# Patient Record
Sex: Male | Born: 1935 | Race: Black or African American | Hispanic: No | Marital: Married | Smoking: Former smoker
Health system: Southern US, Community
[De-identification: ages and names within clinical notes are randomized; demographics above are authoritative.]

## PROBLEM LIST (undated history)

## (undated) DIAGNOSIS — C801 Malignant (primary) neoplasm, unspecified: Secondary | ICD-10-CM

## (undated) DIAGNOSIS — E278 Other specified disorders of adrenal gland: Secondary | ICD-10-CM

## (undated) DIAGNOSIS — I2510 Atherosclerotic heart disease of native coronary artery without angina pectoris: Secondary | ICD-10-CM

## (undated) DIAGNOSIS — Z5189 Encounter for other specified aftercare: Secondary | ICD-10-CM

## (undated) DIAGNOSIS — Z992 Dependence on renal dialysis: Secondary | ICD-10-CM

## (undated) DIAGNOSIS — I499 Cardiac arrhythmia, unspecified: Secondary | ICD-10-CM

## (undated) DIAGNOSIS — J189 Pneumonia, unspecified organism: Secondary | ICD-10-CM

## (undated) DIAGNOSIS — F419 Anxiety disorder, unspecified: Secondary | ICD-10-CM

## (undated) DIAGNOSIS — M1990 Unspecified osteoarthritis, unspecified site: Secondary | ICD-10-CM

## (undated) DIAGNOSIS — I10 Essential (primary) hypertension: Secondary | ICD-10-CM

## (undated) DIAGNOSIS — D649 Anemia, unspecified: Secondary | ICD-10-CM

## (undated) DIAGNOSIS — N186 End stage renal disease: Secondary | ICD-10-CM

## (undated) DIAGNOSIS — G709 Myoneural disorder, unspecified: Secondary | ICD-10-CM

## (undated) DIAGNOSIS — IMO0001 Reserved for inherently not codable concepts without codable children: Secondary | ICD-10-CM

## (undated) DIAGNOSIS — J449 Chronic obstructive pulmonary disease, unspecified: Secondary | ICD-10-CM

## (undated) DIAGNOSIS — Z974 Presence of external hearing-aid: Secondary | ICD-10-CM

## (undated) DIAGNOSIS — E785 Hyperlipidemia, unspecified: Secondary | ICD-10-CM

## (undated) HISTORY — DX: Essential (primary) hypertension: I10

## (undated) HISTORY — DX: Anemia, unspecified: D64.9

## (undated) HISTORY — PX: EYE SURGERY: SHX253

## (undated) HISTORY — PX: DIAGNOSTIC LAPAROSCOPY: SUR761

## (undated) HISTORY — PX: NOSE SURGERY: SHX723

## (undated) HISTORY — DX: Myoneural disorder, unspecified: G70.9

## (undated) HISTORY — DX: Hyperlipidemia, unspecified: E78.5

## (undated) HISTORY — DX: Unspecified osteoarthritis, unspecified site: M19.90

## (undated) HISTORY — PX: US ECHOCARDIOGRAPHY: HXRAD669

## (undated) HISTORY — PX: KIDNEY SURGERY: SHX687

## (undated) HISTORY — DX: Anxiety disorder, unspecified: F41.9

## (undated) HISTORY — DX: Presence of external hearing-aid: Z97.4

## (undated) HISTORY — PX: INSERTION OF DIALYSIS CATHETER: SHX1324

## (undated) HISTORY — DX: Atherosclerotic heart disease of native coronary artery without angina pectoris: I25.10

---

## 1998-07-20 ENCOUNTER — Encounter: Payer: Self-pay | Admitting: Cardiovascular Disease

## 1998-07-20 ENCOUNTER — Inpatient Hospital Stay (HOSPITAL_COMMUNITY): Admission: EM | Admit: 1998-07-20 | Discharge: 1998-07-22 | Payer: Self-pay | Admitting: Emergency Medicine

## 1998-07-22 ENCOUNTER — Encounter: Payer: Self-pay | Admitting: Cardiovascular Disease

## 2000-10-22 ENCOUNTER — Encounter: Admission: RE | Admit: 2000-10-22 | Discharge: 2000-10-22 | Payer: Self-pay | Admitting: Cardiovascular Disease

## 2000-10-22 ENCOUNTER — Encounter: Payer: Self-pay | Admitting: Cardiovascular Disease

## 2000-11-16 ENCOUNTER — Inpatient Hospital Stay (HOSPITAL_COMMUNITY): Admission: EM | Admit: 2000-11-16 | Discharge: 2000-11-18 | Payer: Self-pay | Admitting: Emergency Medicine

## 2000-11-16 ENCOUNTER — Encounter: Payer: Self-pay | Admitting: Cardiovascular Disease

## 2002-03-08 ENCOUNTER — Encounter: Payer: Self-pay | Admitting: Cardiovascular Disease

## 2002-03-08 ENCOUNTER — Ambulatory Visit (HOSPITAL_COMMUNITY): Admission: RE | Admit: 2002-03-08 | Discharge: 2002-03-08 | Payer: Self-pay | Admitting: Cardiovascular Disease

## 2002-03-10 ENCOUNTER — Ambulatory Visit (HOSPITAL_COMMUNITY): Admission: RE | Admit: 2002-03-10 | Discharge: 2002-03-10 | Payer: Self-pay | Admitting: Cardiovascular Disease

## 2002-03-10 ENCOUNTER — Encounter: Payer: Self-pay | Admitting: Cardiovascular Disease

## 2002-03-11 ENCOUNTER — Ambulatory Visit (HOSPITAL_COMMUNITY): Admission: RE | Admit: 2002-03-11 | Discharge: 2002-03-11 | Payer: Self-pay | Admitting: Cardiovascular Disease

## 2002-03-11 ENCOUNTER — Encounter: Payer: Self-pay | Admitting: Cardiovascular Disease

## 2002-03-12 ENCOUNTER — Encounter (INDEPENDENT_AMBULATORY_CARE_PROVIDER_SITE_OTHER): Payer: Self-pay | Admitting: Specialist

## 2002-03-12 ENCOUNTER — Encounter: Payer: Self-pay | Admitting: Cardiovascular Disease

## 2002-03-12 ENCOUNTER — Inpatient Hospital Stay (HOSPITAL_COMMUNITY): Admission: EM | Admit: 2002-03-12 | Discharge: 2002-03-30 | Payer: Self-pay | Admitting: Emergency Medicine

## 2002-03-13 ENCOUNTER — Encounter: Payer: Self-pay | Admitting: Cardiovascular Disease

## 2002-03-16 ENCOUNTER — Encounter: Payer: Self-pay | Admitting: Cardiovascular Disease

## 2002-03-19 ENCOUNTER — Encounter: Payer: Self-pay | Admitting: Internal Medicine

## 2002-03-23 ENCOUNTER — Encounter: Payer: Self-pay | Admitting: Cardiovascular Disease

## 2002-03-24 ENCOUNTER — Encounter: Payer: Self-pay | Admitting: Urology

## 2002-03-29 ENCOUNTER — Encounter: Payer: Self-pay | Admitting: Cardiovascular Disease

## 2002-04-08 ENCOUNTER — Encounter (INDEPENDENT_AMBULATORY_CARE_PROVIDER_SITE_OTHER): Payer: Self-pay | Admitting: Specialist

## 2002-04-08 ENCOUNTER — Inpatient Hospital Stay (HOSPITAL_COMMUNITY): Admission: RE | Admit: 2002-04-08 | Discharge: 2002-04-23 | Payer: Self-pay | Admitting: Urology

## 2002-04-08 ENCOUNTER — Encounter: Payer: Self-pay | Admitting: Urology

## 2002-04-09 ENCOUNTER — Encounter: Payer: Self-pay | Admitting: Urology

## 2002-04-10 ENCOUNTER — Encounter: Payer: Self-pay | Admitting: Urology

## 2002-04-12 ENCOUNTER — Encounter: Payer: Self-pay | Admitting: Urology

## 2002-04-17 ENCOUNTER — Encounter: Payer: Self-pay | Admitting: Urology

## 2002-04-19 ENCOUNTER — Encounter: Payer: Self-pay | Admitting: Urology

## 2002-04-20 ENCOUNTER — Encounter: Payer: Self-pay | Admitting: Internal Medicine

## 2002-04-25 ENCOUNTER — Encounter: Payer: Self-pay | Admitting: Emergency Medicine

## 2002-04-25 ENCOUNTER — Inpatient Hospital Stay (HOSPITAL_COMMUNITY): Admission: EM | Admit: 2002-04-25 | Discharge: 2002-04-30 | Payer: Self-pay | Admitting: Emergency Medicine

## 2002-04-26 ENCOUNTER — Encounter: Payer: Self-pay | Admitting: Urology

## 2002-04-27 ENCOUNTER — Encounter: Payer: Self-pay | Admitting: Urology

## 2002-05-21 ENCOUNTER — Encounter: Payer: Self-pay | Admitting: Urology

## 2002-05-21 ENCOUNTER — Encounter: Admission: RE | Admit: 2002-05-21 | Discharge: 2002-05-21 | Payer: Self-pay | Admitting: Urology

## 2002-11-26 ENCOUNTER — Encounter: Payer: Self-pay | Admitting: Urology

## 2002-11-26 ENCOUNTER — Ambulatory Visit (HOSPITAL_COMMUNITY): Admission: RE | Admit: 2002-11-26 | Discharge: 2002-11-26 | Payer: Self-pay | Admitting: Urology

## 2004-05-11 ENCOUNTER — Encounter: Admission: RE | Admit: 2004-05-11 | Discharge: 2004-05-11 | Payer: Self-pay | Admitting: Cardiovascular Disease

## 2007-02-19 ENCOUNTER — Inpatient Hospital Stay (HOSPITAL_COMMUNITY): Admission: AD | Admit: 2007-02-19 | Discharge: 2007-02-24 | Payer: Self-pay | Admitting: Cardiovascular Disease

## 2007-03-19 ENCOUNTER — Encounter (HOSPITAL_COMMUNITY): Admission: RE | Admit: 2007-03-19 | Discharge: 2007-06-17 | Payer: Self-pay | Admitting: Cardiovascular Disease

## 2007-07-23 DIAGNOSIS — J189 Pneumonia, unspecified organism: Secondary | ICD-10-CM

## 2007-07-23 HISTORY — DX: Pneumonia, unspecified organism: J18.9

## 2007-10-27 ENCOUNTER — Encounter: Admission: RE | Admit: 2007-10-27 | Discharge: 2007-10-27 | Payer: Self-pay | Admitting: Cardiovascular Disease

## 2007-11-29 ENCOUNTER — Emergency Department (HOSPITAL_COMMUNITY): Admission: EM | Admit: 2007-11-29 | Discharge: 2007-11-29 | Payer: Self-pay | Admitting: Family Medicine

## 2007-12-18 ENCOUNTER — Ambulatory Visit (HOSPITAL_BASED_OUTPATIENT_CLINIC_OR_DEPARTMENT_OTHER): Admission: RE | Admit: 2007-12-18 | Discharge: 2007-12-18 | Payer: Self-pay | Admitting: Otolaryngology

## 2008-01-07 ENCOUNTER — Inpatient Hospital Stay (HOSPITAL_COMMUNITY): Admission: EM | Admit: 2008-01-07 | Discharge: 2008-01-11 | Payer: Self-pay | Admitting: Emergency Medicine

## 2008-07-22 DIAGNOSIS — C801 Malignant (primary) neoplasm, unspecified: Secondary | ICD-10-CM

## 2008-07-22 HISTORY — PX: CORONARY ANGIOPLASTY WITH STENT PLACEMENT: SHX49

## 2008-07-22 HISTORY — DX: Malignant (primary) neoplasm, unspecified: C80.1

## 2009-01-28 ENCOUNTER — Emergency Department (HOSPITAL_COMMUNITY): Admission: EM | Admit: 2009-01-28 | Discharge: 2009-01-28 | Payer: Self-pay | Admitting: Family Medicine

## 2009-02-23 ENCOUNTER — Encounter: Admission: RE | Admit: 2009-02-23 | Discharge: 2009-02-23 | Payer: Self-pay | Admitting: Cardiovascular Disease

## 2009-02-27 ENCOUNTER — Encounter: Admission: RE | Admit: 2009-02-27 | Discharge: 2009-02-27 | Payer: Self-pay | Admitting: Cardiovascular Disease

## 2009-07-22 HISTORY — PX: CHEST TUBE INSERTION: SHX231

## 2009-08-04 ENCOUNTER — Encounter: Admission: RE | Admit: 2009-08-04 | Discharge: 2009-08-04 | Payer: Self-pay | Admitting: Nephrology

## 2010-03-15 ENCOUNTER — Ambulatory Visit (HOSPITAL_COMMUNITY): Admission: RE | Admit: 2010-03-15 | Discharge: 2010-03-15 | Payer: Self-pay | Admitting: Ophthalmology

## 2010-10-04 LAB — CBC
HCT: 27.4 % — ABNORMAL LOW (ref 39.0–52.0)
Hemoglobin: 9.7 g/dL — ABNORMAL LOW (ref 13.0–17.0)
MCHC: 35.4 g/dL (ref 30.0–36.0)

## 2010-10-04 LAB — BASIC METABOLIC PANEL
CO2: 25 mEq/L (ref 19–32)
GFR calc non Af Amer: 13 mL/min — ABNORMAL LOW (ref >60)
Glucose, Bld: 144 mg/dL — ABNORMAL HIGH (ref 70–99)
Potassium: 3.9 mEq/L (ref 3.5–5.1)
Sodium: 130 mEq/L — ABNORMAL LOW (ref 135–145)

## 2010-10-10 NOTE — Op Note (Signed)
NAME:  [name], [name]                 ACCOUNT NO.:  [id number]  MEDICAL RECORD NO.:  [id number]          PATIENT TYPE:  AMB  LOCATION:  SDS                          FACILITY:  [facility]  PHYSICIAN:  Chalmers Guest, M.D.     DATE OF BIRTH:  Aug 14, 1925  DATE OF PROCEDURE:  03/15/2010 DATE OF DISCHARGE:  03/15/2010                              OPERATIVE REPORT  PREOPERATIVE DIAGNOSIS:  Complex cataract right eye.  The lens is white making it very difficult to perform the procedure and the procedure required staining with methylene blue.  POSTOPERATIVE DIAGNOSIS:  Complex cataract right eye.  The lens is white making it very difficult to perform the procedure and the procedure required staining with methylene blue.  PROCEDURE:  Phacoemulsification intraocular lens implant.  COMPLICATIONS:  None.  ANESTHESIA:  Xylocaine 2% without epinephrine in a 50:50 mixture with 0.75% Marcaine.  The patient has significant cardiovascular disease, renal failure, and his cardiologist requested no epinephrine. Therefore, no epinephrine were used in the drops to dilate the patient which causes best dilation.  PROCEDURE:  The patient was given a peribulbar block with the aforementioned local anesthetic agent.  His face was prepped and draped in usual sterile fashion after pressure was applied to the eye.  With the surgeon sitting temporally and the microscope in position, it was obvious that there was a white nucleus.  The pupil measured 6 mm.  With the lid speculum in position, a Weck-cel was used to fixate the eye and a 15-degree blade was used to enter through clear cornea at the 11 o'clock position and Viscoat was injected.  Following this, using another Weck-cel, a 2.75-mm keratome blade was used in a stepwise fashion through temporal clear cornea.  Before I injected the Viscoat, methylene blue was injected to stain the anterior capsule and then this was irrigated out with balanced salt solution and  then the Viscoat was injected.  When the Viscoat was injected, the pupil did dilate to 7 mm. Following this, using a bent 25-gauge needle to incise the anterior capsule, a curvilinear continuous tear capsulorrhexis was performed using the Utrata forceps to remove the anterior capsule.  The stain capsule made visibility much better.  BSS was used to hydrodissect the nucleus.  It was not possible to see the fluid wave because of the density of the nucleus nucleus.  Following this, the phaco unit was used to sculpt the trough. There was white milky material, visibility was very difficult.  After sculpting the trough, the nucleus was rotated and it was sculpted again, visibility was very difficult, therefore additional viscoelastic was injected.  The trough was sculpted and then an attempt was made to crack the nucleus but it would not crack.  At this point, it was noted that a slight reflex was visible indicating that the nuclear plate was getting thinner, therefore sculpting was more difficult.  An attempt was made to aspirate the nucleus.  The nucleus partly elevated out of the bag.  The instrument was removed from the eye and Viscoat was injected under the nucleus to try to get it into the iris plane.  Additional phacoemulsification was carried out.  It was necessary to sculpt the nucleus to a thinner plate and then viscoelastic was injected.  With the phacoemulsification on the second setting of 220 vacuum, the nucleus was brought into the iris plane and phacoemulsification was carried out in the iris plane to sculpt down the plate using the Kuglen hook to support the nucleus.  The very hard nucleus eventually was aspirated completely into the phacoemulsification tip.  Following this, a small amount of IA was done and then Provisc was injected in the eye.  The intraocular lens implant was examined.  The posterior capsule remained intact.  The lens had no defects.  Therefore, AcrySof  SN60WF 21.5 diopter lens was placed in the lens shooter.  It was then injected under the anterior capsular leaf.  It unfolded and was positioned with a Kuglen hook.  The IA was used to remove viscoelastic from the eye.  The IA was pressurized and a single 10-0 nylon suture was placed.  There was no leakage.  Following this, Miostat was injected in the eye and the pupil came down round.  Following this, topical TobraDex was applied to the eye.  The instruments were removed from the eye.  Patch and Fox shield were placed, and the patient returned to recovery area in stable condition.  Complications were none.     Chalmers Guest, M.D.    RW/MEDQ  D:  03/15/2010  T:  03/16/2010  Job:  [id number]  Electronically Signed by Chalmers Guest M.D. on 10/10/2010 04:43:58 PM

## 2010-10-28 LAB — DIFFERENTIAL
Basophils Absolute: 0 10*3/uL (ref 0.0–0.1)
Basophils Relative: 0 % (ref 0–1)
Eosinophils Absolute: 0.1 10*3/uL (ref 0.0–0.7)
Eosinophils Relative: 4 % (ref 0–5)
Monocytes Absolute: 0.5 10*3/uL (ref 0.1–1.0)
Neutro Abs: 2.1 10*3/uL (ref 1.7–7.7)

## 2010-10-28 LAB — BASIC METABOLIC PANEL
CO2: 30 mEq/L (ref 19–32)
Calcium: 11.3 mg/dL — ABNORMAL HIGH (ref 8.4–10.5)
Glucose, Bld: 121 mg/dL — ABNORMAL HIGH (ref 70–99)
Sodium: 135 mEq/L (ref 135–145)

## 2010-10-28 LAB — CBC
HCT: 32.5 % — ABNORMAL LOW (ref 39.0–52.0)
Hemoglobin: 11.3 g/dL — ABNORMAL LOW (ref 13.0–17.0)
MCHC: 34.8 g/dL (ref 30.0–36.0)
MCV: 89.4 fL (ref 78.0–100.0)
RDW: 14.2 % (ref 11.5–15.5)

## 2010-12-04 NOTE — Cardiovascular Report (Signed)
NAME:  [name], [name] NO.:  [id number]   MEDICAL RECORD NO.:  [id number]          PATIENT TYPE:  OBV   LOCATION:  4730                         FACILITY:  [facility]   PHYSICIAN:  Ricki Rodriguez, M.D.  DATE OF BIRTH:  September 26, 1925   DATE OF PROCEDURE:  02/19/2007  DATE OF DISCHARGE:                            CARDIAC CATHETERIZATION   PROCEDURES:  Left heart catheterization, selective coronary angiography.   INDICATIONS:  This is a 77 year old black male with known coronary  artery disease, hypertension, sinus bradycardia and chest pressure.   APPROACH:  Right femoral artery using 4-French sheath and catheters.   COMPLICATIONS:  None.   HEMODYNAMIC DATA:  The left ventricular pressure was 112/4.  Aortic  pressure was 115/70/90.  Less than 35 mL of dye was used.   CORONARY ANATOMY:  The left main coronary artery was near normal except  for ectasia and mild calcification.   Left anterior descending coronary artery.  The left anterior descending  coronary artery also was ectatic in the proximal half of the vessel, had  luminal irregularities throughout that portion.  The distal vessel had  mild disease.   Diagonal vessel had ostial 20% and proximal luminal irregularities.   Left circumflex coronary artery.  The left circumflex coronary artery  had proximal 20% lesion then continued as obtuse marginal branch with  ectatic proximal half of the vessel.  The distal 1/2 trifurcated and was  unremarkable.   Right coronary artery.  The right coronary artery was dominant and had  entire vessel ectasia up to the origin of the posterolateral and  posterior descending coronary artery.  It had a mid vessel 30% lesion  followed by a 70-80% lesion and then followed by 30% lesion x2.  The  distal vessel had another 20-30% lesion.  The posterolateral branch and  was small and unremarkable and posterior descending coronary artery was  also small and unremarkable.  The entire  vessel had showed mild  calcification.   IMPRESSION:  1. Significant mid right coronary artery disease.  2. Mild left anterior descending and left circumflex coronary artery      disease.   RECOMMENDATIONS:  This patient will undergo PTCA/stent placement in the  right coronary artery near future after observation for renal function.      Ricki Rodriguez, M.D.  Electronically Signed     ASK/MEDQ  D:  02/19/2007  T:  02/19/2007  Job:  [id number]

## 2010-12-04 NOTE — Op Note (Signed)
NAMEBRISTON, LAX NO.:  [id number]   MEDICAL RECORD NO.:  [id number]          PATIENT TYPE:  INP   LOCATION:  5002                         FACILITY:  [facility]   PHYSICIAN:  Eulas Post, MD    DATE OF BIRTH:  July 17, 1926   DATE OF PROCEDURE:  01/08/2008  DATE OF DISCHARGE:                               OPERATIVE REPORT   PREPROCEDURE DIAGNOSIS:  Left knee effusion.   POSTPROCEDURE DIAGNOSIS:  Left knee effusion.   PROCEDURE:  Aspiration of the left knee.   ANESTHETIC:  Lidocaine.   PREPROCEDURE INDICATIONS:  Nivin Braniff is an 77 year old man who  complains of left knee swelling and pain.  The diagnosis was either gout  or septic knee.  He elected to undergo the above named procedure.  This  was to be a diagnostic aspiration.  We discussed that he may need an  another injection if this does not to be turn out be gout or calcium  pyrophosphate disease, but I was reluctant to inject corticosteroid in  the presence of a possible infection.   PROCEDURE:  The left knee was prepped with Betadine.  A superolateral  portal was utilized.  A 25 gauge needle was used to inject some topical  lidocaine.  An 18-gauge needle was then used.  A total of 100 mL of what  appeared to be relatively normal joint fluid was aspirated.  The patient  tolerated the procedure well.  There no complications.  Fluid was sent  to the laboratory for Gram stain, culture sensitivity, and analysis.      Eulas Post, MD  Electronically Signed     JPL/MEDQ  D:  01/08/2008  T:  01/09/2008  Job:  [id number]

## 2010-12-04 NOTE — Cardiovascular Report (Signed)
NAME:  [name], [name]                 ACCOUNT NO.:  [id number]   MEDICAL RECORD NO.:  [id number]          PATIENT TYPE:  INP   LOCATION:  4730                         FACILITY:  [facility]   PHYSICIAN:  Mohan N. Sharyn Lull, M.D. DATE OF BIRTH:  1926-03-14   DATE OF PROCEDURE:  02/23/2007  DATE OF DISCHARGE:                            CARDIAC CATHETERIZATION   PROCEDURE.:  1. Successful PTCA to mid RCA using 3.0 x 12-mm Dobesh Voyager balloon.  2. Successful deployment of 3.5 x 33-mm Allmendinger Cypher drug-eluting stent      in mid RCA.  3. Successful post dilatation of Cypher drug-eluting stent using 3.75      x 18-mm Vanderloop PowerSail balloon   INDICATIONS FOR PROCEDURE:  [name] is 77 year old black male with past  medical history significant for coronary artery disease, hypertension,  chronic renal insufficiency and anemia, was admitted by Dr. Algie Coffer  on  02/17/2007 because of retrosternal chest pressure associated with  shortness of breath, and was noted to have mildly elevated CPK-MB.  The  patient subsequently underwent left cardiac cath on 02/19/2007 which  showed 70-80% mid sequential RCA stenosis and was noted to have mild  coronary artery disease in LAD and left circumflex.  Due to his chronic  renal insufficiency, the procedure was staged and he is brought down  today for PCI to RCA.   PROCEDURE:  After obtaining informed consent the patient was brought to  the cath lab and was placed on fluoroscopy table.  The right groin was  prepped and draped in usual fashion.  2% Xylocaine was used for local  anesthesia in the right groin.  With the help of thin-wall needle a 6-  French arterial sheath was placed.  Sheath was aspirated and flushed.  Next a 6-French left Judkins guiding catheter was advanced over the wire  under fluoroscopic guidance up to the ascending aorta.  Wire was pulled  out the catheter was aspirated and connected to the manifold.  Catheter  was further advanced and engaged  into right coronary ostium.  Multiple  views of the right coronary artery were taken.   FINDINGS:  RCA has a 20-30% proximal and mid junction stenosis, 70-80%  mid stenosis and 30- 40% mid to distal junction stenosis.  Distally, the  vessel has mild disease.  Posterolateral branches and PDA were small and  unremarkable.  Interventional procedure:  Successful PTCA to mid RCA was  done using 3.0 x 12-mm Merida Voyager balloon for predilatation and then  3.5 x 33-mm Langenberg Cypher drug-eluting stent was deployed in mid RCA at 15  atmospheres pressure.  A stent was postdilated using 3.75 x 18-mm Borin  PowerSail balloon going up to 18 and 20 atmospheres pressure.  Lesion  was dilated from 70-80% to 0% and with excellent TIMI grade 3 distal  flow without  evidence of dissection or distal embolization.  The patient received  weight-based Angiomax and 300 mg of Plavix during the procedure.  The  patient tolerated procedure well.  There are no complications.  The  patient was transferred to recovery room in stable  condition.      Eduardo Osier. Sharyn Lull, M.D.  Electronically Signed     MNH/MEDQ  D:  02/23/2007  T:  02/23/2007  Job:  [id number]   cc:   Ricki Rodriguez, M.D.  Cath Lab

## 2010-12-04 NOTE — Op Note (Signed)
NAME:  [name], [name]                 ACCOUNT NO.:  [id number]   MEDICAL RECORD NO.:  [id number]          PATIENT TYPE:  AMB   LOCATION:  DSC                          FACILITY:  [facility]   PHYSICIAN:  Christopher E. Ezzard Standing, M.D.DATE OF BIRTH:  1925/11/16   DATE OF PROCEDURE:  12/18/2007  DATE OF DISCHARGE:                               OPERATIVE REPORT   PREOPERATIVE DIAGNOSIS:  Recurrent left-sided epistaxis.   POSTOPERATIVE DIAGNOSIS:  Recurrent left-sided epistaxis.   OPERATION:  Endoscopic cauterization of left-sided epistaxis.   SURGEON:  Kristine Garbe. Ezzard Standing, MD   ANESTHESIA:  General endotracheal.   COMPLICATIONS:  None.   CLINICAL NOTE:  [name] is a 77 year old gentleman who has had a  history of several nosebleeds over the past month.  Bleeding has been  bad enough that he has had difficulty stopping it with pressure.  He has  been on Plavix and aspirin because of cardiac problems and a stent.  Because of recurrent epistaxis, he was referred to my office.  On exam  in the office, he has bleeding a little bit more posterior on left side.  He has a septal deviation with a spur on the left side of the septum,  making cauterization in the office very difficult.  He is taken to the  operating room at this time for endoscopic cauterization of left-sided  epistaxis.   DESCRIPTION OF PROCEDURE:  After adequate endotracheal anesthesia using  the 0 and 33 endoscope, the nasal cavity was examined.  He had 2 areas  that had been bleeding; 1, a little posterior on the lateral portion of  the nose about a centimeter in front of the right middle turbinate, this  was cauterized with suction cautery as well as silver nitrate.  Another  area of bleeding was just behind the septal spur on the septum side,  which was likewise cauterized with the suction cautery.  This completed  the procedure.  The remaining nasal cavity was clear.  No obvious  evidence of prominent vessels or  epistaxis.  There were no abnormal  lesions noted.  No packing was placed.  Kydan was awoke from anesthesia  and transferred to the recovery room postop doing well.   DISPOSITION:  Argie is discharged home on a regular medications.  He  will restart the aspirin in 2 days and restart the Plavix in 3 days.  He  will notify my office if he has any further nosebleeds.           ______________________________  Kristine Garbe Ezzard Standing, M.D.    CEN/MEDQ  D:  12/18/2007  T:  12/19/2007  Job:  [id number]   cc:   Ricki Rodriguez, M.D.  Windle Guard, M.D.

## 2010-12-04 NOTE — Op Note (Signed)
NAMEZACKARIA, BURKEY NO.:  [id number]   MEDICAL RECORD NO.:  [id number]          PATIENT TYPE:  INP   LOCATION:  5002                         FACILITY:  [facility]   PHYSICIAN:  Eulas Post, MD    DATE OF BIRTH:  16-Mar-1926   DATE OF PROCEDURE:  01/09/2008  DATE OF DISCHARGE:                               OPERATIVE REPORT   PREPROCEDURE DIAGNOSIS:  Left knee gout and pseudogout.   POSTPROCEDURE DIAGNOSES:  Left knee gout and pseudogout.   PROCEDURES:  Left knee aspiration and injection, injected material is  corticosteroid; Depo-Medrol, a total of 80 mg and 9 mL of 2% Marcaine.   PREPROCEDURE INDICATIONS:  Mr. Devell Parkerson is a 77 year old man that I  aspirated yesterday for diagnostic purposes.  The aspiration came back  as gout and does not appear to be a septic knee.  There were crystals  confirmed on fluid analysis.  He elected to undergo a repeat aspiration  as well as a injection of corticosteroid.  I did not inject in yesterday  because there was concern of septic knee, however, given the presence of  crystals it appears to be gout.  Therefore we proceeded with a repeat  aspiration and injection.   PROCEDURE:  The left knee was prepped using Betadine.  An 18-gauge  needle was used to aspiration another 50 mL of relatively normal-  appearing joint fluid.  We then injected Depo-Medrol and Marcaine.  The  patient tolerated the procedure well.  There was no complications.      Eulas Post, MD  Electronically Signed     JPL/MEDQ  D:  01/09/2008  T:  01/09/2008  Job:  [id number]

## 2010-12-04 NOTE — Consult Note (Signed)
NAMERHYDIAN, BALDI NO.:  [id number]   MEDICAL RECORD NO.:  [id number]          PATIENT TYPE:  INP   LOCATION:  5002                         FACILITY:  [facility]   PHYSICIAN:  Derrick Post, MD    DATE OF BIRTH:  1926/01/19   DATE OF CONSULTATION:  01/08/2008  DATE OF DISCHARGE:                                 CONSULTATION   REQUESTING PHYSICIAN:  Derrick Rodriguez, MD   CHIEF COMPLAINT:  Left knee and foot pain.   HISTORY:  [name] is a 77 year old man who has a history of gout  who complains of increasing left foot and knee pain for the past 3-4  days.  He says he has had some subjective fevers.  He complains of  increasing swelling and inability to walk and the knee is now  excruciating.  He cannot move it.  He presented to the hospital and was  admitted for management.  He currently rates the pain as at least at 8  or 9/10.  It is located directly over his knee and he has some pain  along the posterolateral ankle, but he does not have pain with range of  motion of the ankle according to him.   PAST MEDICAL HISTORY:  1. Hypertension.  2. Gout.  3. Coronary artery disease.  4. Chronic renal insufficiency.  5. History of an MI.   FAMILY HISTORY:  Positive for coronary artery disease in his father.   SOCIAL HISTORY:  He is married.   REVIEW OF SYSTEMS:  He reports recent mild left-sided chest pain that he  describes as a catch with deep inspiration.  He says that his  physician had taken some test and said that this was nothing to be  concerned about.  He also reports some odd sensations in his legs of  feeling cold.  He also reports hearing loss.  All other review of  systems is negative with the exception of the musculoskeletal as  indicated in history present illness.   PHYSICAL EXAMINATION:  GENERAL:  He is lying in bed in no apparent  distress.  NECK:  He has full range of motion with no radiculopathy.  He has a  midline trachea and no  masses.  LYMPHATIC:  He has no cervical or axillary lymphadenopathy.  CARDIOVASCULAR:  He has no significant peripheral edema, although he has  a significant effusion in his left knee.  RESPIRATORY:  He has no increase in respiratory efforts and no cyanosis.  GI:  His abdomen is soft and nontender with no organomegaly or rebound.  PSYCHIATRIC:  His mood and affect seem to be appropriate.  NEUROLOGIC:  His sensation is intact throughout his lower extremities.  MUSCULOSKELETAL:  He has a large effusion in the left knee and he has  mild warmth.  There is no apparent cellulitis.  His knee range of motion  is limited to about 30 degrees.  His ankle has good range of motion, but  he has some tenderness posterolaterally with a small joint effusion.   LABORATORY DATA:  Demonstrates a creatinine  of 2.5 and a peripheral  white count of 7.  He has x-rays that are indicative of calcium  pyrophosphate disease.   IMPRESSION:  Left knee gout versus septic knee.   PLAN:  I have discussed the options with [name], and he would like to  have an aspiration of his knee.  I am not going to plan to inject him at  this time, but rather find the results of the aspiration.  If he is  still symptomatic after the aspiration and it does indeed come back to  be gout, then we will consider re-aspirating and re-injecting with a  corticosteroid.  In the case that it is a septic knee, however, I do not  want to inject him at this time as this may exacerbate his problems.  Therefore, we will proceed with an aspiration and depending on the  results of this he may need to be initiated on oral gout treatment and  we may also consider a repeat aspiration with an injection after a firm  diagnosis is made.  He is currently on vancomycin and so we will  continue with the antibiotics until we definitively rule out a septic  knee.  Thank you for requesting our assistance and we will help to get  [name] back on his  feet.      Derrick Post, MD  Electronically Signed     JPL/MEDQ  D:  01/08/2008  T:  01/09/2008  Job:  [id number]   cc:   [name], M.D.

## 2010-12-04 NOTE — Discharge Summary (Signed)
[name], [name]                 ACCOUNT NO.:  [id number]   MEDICAL RECORD NO.:  [id number]          PATIENT TYPE:  INP   LOCATION:  6523                         FACILITY:  [facility]   PHYSICIAN:  Ricki Rodriguez, M.D.  DATE OF BIRTH:  1925-07-29   DATE OF ADMISSION:  02/17/2007  DATE OF DISCHARGE:  02/24/2007                               DISCHARGE SUMMARY   FINAL DIAGNOSES:  1. Acute myocardial infarction, subendocardial, initial episode.  2. Coronary atherosclerosis of native coronary vessel.  3. Dehydration.  4. Hypertensive renal disease.  5. Chronic kidney disease.  6. Anemia.   PRINCIPAL PROCEDURE:  1. Left heart catheterization, selective coronary angiography, left      renal function study done by Dr. Orpah Cobb on February 19, 2007.  2. Angioplasty of right coronary artery done by Dr. Rinaldo Cloud on      February 23, 2007.   DISCHARGE MEDICATIONS:  1. Atacand 32 mg one daily.  2. Lipitor 10 mg daily.  3. Aspirin 325 mg one daily.  4. Plavix 75 mg one daily.  5. Lisinopril 40 mg one daily.  6. Imdur 60 mg one daily.  7. Norvasc 5 mg one daily.  8. Colchicine 0.6 mg one daily.  9. Tramadol 50 mg twice daily as needed.  10.Lasix 80 mg one daily as needed for leg swelling.  11.Potassium chloride 10 mEq one daily as needed with Lasix use.   DISCHARGE DIET:  Low-sodium heart-healthy diet.   WOUND CARE INSTRUCTIONS:  The patient to notify of right groin pain,  swelling or discharge.   DISCHARGE ACTIVITY:  The patient to increase activity slowly.   SPECIAL INSTRUCTIONS:  The patient to notify of chest pain, shortness of  breath, dizziness, sweating, excessive weakness with activity and to  discontinue those activities.   FOLLOWUP:  By Dr. Orpah Cobb in 2 weeks.  The patient or family to  call [phone] for appointment.   CONDITION ON DISCHARGE:  Improved.   HISTORY:  This 77 year old black male presented with weakness, dizziness  since his Lasix dose was increased  to twice daily.  The patient denied  any chest pain.  However, he had some left hip pain after working in the  garden.  He has a longstanding history of hypertension and renal  insufficiency.   PHYSICAL EXAMINATION:  Temperature 97.8, pulse 51, respirations 20,  blood pressure 155/87, height 6 feet 2 inches, weight 84 kg, oxygen  saturation 100% on room air.  GENERAL:  The patient is well-built, averagely-nourished black male in  no acute distress.  HEENT:  The patient is normocephalic, atraumatic, with brown eyes.  Conjunctivae pink.  Pupils equally reacting to light.  Extraocular  movement intact.  NECK:  No JVD, no carotid bruit.  LUNGS:  Clear bilaterally.  HEART:  Normal S1, S2.  ABDOMEN:  Soft.  EXTREMITIES:  No edema, cyanosis, clubbing.  SKIN:  Warm and dry.  CNS:  The patient moves all four extremities.  Cranial nerves grossly  intact.   LABORATORY DATA:  Normal hemoglobin/hematocrit, wbc count, platelet  count.  Electrolytes:  Sodium low at 129, potassium 4.5, BUN 36,  creatinine 2.4.  B-natriuretic peptide was minimally elevated at 123.  Magnesium 2.1, albumin 3.3.  INR 1.1.  CK elevated at 344, MB 10.  Subsequent CK was down to 260 with an MB of 6.5.  Final CK was normal at  147 with an MB of 3.5.  Troponin I remained normal.  Cholesterol level  was 113, HDL cholesterol 43, triglyceride of 24, LDL cholesterol of 65.  Thyroid stimulating hormone 1.190.   Chest x-ray:  COPD without acute process.   EKG:  Sinus bradycardia with first degree AV block.  Subsequent EKG  showed mild improvement in the heart rate to 50s.   Cardiac catheterization showed significant right coronary artery native  mid vessel blockage.  Post angioplasty, the blockage was decreased to  0%.   HOSPITAL COURSE:  The patient was admitted to telemetry unit.  He was  given IV fluids.  His cardiac enzymes were abnormal.  Hence, he  underwent cardiac catheterization that showed significant right  coronary  artery blockage.  Dr. Sharyn Lull was consulted.  The patient's renal  function was monitored for an additional 3 days and a drug-eluting  Cypher 3.5 mm x 33 mm stent was placed in mid RCA with decreasing 70-80%  lesion to 0% with TIMI grade 3 flow.   Post procedure, the patient had no complications and he was discharged  home in satisfactory condition on February 24, 2007, with followup by me in  2 weeks.      Ricki Rodriguez, M.D.  Electronically Signed     ASK/MEDQ  D:  05/28/2007  T:  05/28/2007  Job:  [id number]

## 2010-12-07 NOTE — Consult Note (Signed)
NAME:  [name], [name] NO.:  [id number]   MEDICAL RECORD NO.:  [id number]                   PATIENT TYPE:   LOCATION:                                       FACILITY:   PHYSICIAN:  Sigmund I. Patsi Sears, M.D.         DATE OF BIRTH:   DATE OF CONSULTATION:  03/12/2002  DATE OF DISCHARGE:                               UROLOGY CONSULTATION   HISTORY OF PRESENT ILLNESS:  The patient is a 77 year old married black  male, with a Liotta history of proteinuria and common hypertension. He is  treated by Dr. Algie Coffer and under good control. The patient did well until  several days ago,  when he developed increasing general malaise. He saw Dr.  Algie Coffer, with an abdominal ultrasound showing multiple renal cysts and a  normal  gallbladder. He was also noted to have bilateral hydronephrosis. His  past history is significant for MI x 2, history of coronary artery disease.  The patient is noted to have no dysuria, no gross hematuria and no bladder  symptoms until the last couple days, when he developed significant nocturia,  urgency, frequency. The patient did note some transient right lower quadrant  and flank pain earlier this week.  Recent x-rays, the patient had an  abdominal pelvic CT scan, noncontrast on March 11, 2002, at [facility], showing bilateral moderate hydronephrosis, with no stones. There  was a soft tissue density noted, which could be retroperitoneal lymph nodes,  but could be retroperitoneal fibrosis or a cancer. He is now admitted via  special procedures for percutaneous nephrostomy tube placement.   PAST MEDICAL HISTORY:  Significant for proteinuria (less than 3 gm),  followed by Dr. Darrick Penna. The patient had a serum creatinine of 1.2 in June  of 2002. Current creatinine is less 11.4 (was 9.6 yesterday).  A urinalysis  shows no red cells, no white cells, specific gravity 1.011. Sodium 128,  potassium 4.3.   FAMILY HISTORY:   Noncontributory.   PHYSICAL EXAMINATION:  GENERAL:  Examination today shows an edentulous  elderly black male in no acute distress.  VITAL SIGNS:  Blood pressure 130/80, pulse 80.  ABDOMEN:  Soft, decrease in bowel sounds, without organomegaly, without  masses palpable.  GENITOURINARY:  Negative.  EXTREMITIES:  Negative.  RECTAL:  Examination pending.   IMPRESSION:  Bilateral hydronephrosis, secondary to retroperitoneal  abnormality, possibly fibrosis versus lymphadenopathy.    PLAN:  Will check MRI.                                                 Sigmund I. Patsi Sears, M.D.    SIT/MEDQ  D:  03/12/2002  T:  03/15/2002  Job:  [id number]   cc:   Ricki Rodriguez, M.D.  [address]  [place]  [place] [address]

## 2010-12-07 NOTE — Discharge Summary (Signed)
[place]  Patient:    [name], [name]                       MRN: [id number] Adm. Date:  [id number] Disc. Date: [id number] Attending:  Orpah Cobb S                           Discharge Summary  PRINCIPAL DIAGNOSES: 1. Acute non-Q-wave myocardial infarction. 2. Hypertension. 3. Anxiety.  PRINCIPAL PROCEDURES:  Left heart catheterization with angiography done on November 17, 2000, by Ricki Rodriguez, M.D.  DISCHARGE MEDICATIONS: 1. Norvasc 5 mg daily. 2. Toprol XL 25 mg daily. 3. Aspirin 81 mg two tablets daily. 4. Plavix 75 mg daily. 5. Flexeril 10 mg at bedtime as needed. 6. Tylenol P.M. at bedtime as needed. 7. HCTZ 25 mg daily. 8. Nitroglycerin 0.4 mg/hr patch.  Apply a new one q.d. and off q.h.s. 9. Nitroglycerin 0.4 mg tablet every five minutes x 3 p.r.n. for chest pain.  DISCHARGE DIET:  Low-fat, low-salt diet.  DISCHARGE ACTIVITY:  As tolerated.  The patient is to cut down activity voluntarily.  FOLLOW-UP:  By Ricki Rodriguez, M.D., in two weeks.  The patient is to call [phone] for appointment.  CONDITION ON DISCHARGE:  Improved.  HISTORY OF PRESENT ILLNESS:  This 77 year old black male presented with substernal chest pain three days prior to admission while he was resting.  On the day of admission, he felt weak.  He did not have any chest pain on the day of admission.  Denied any nausea, vomiting, or diarrhea.  He had some upper abdominal discomfort.  PAST MEDICAL HISTORY:  Positive for hypertension for several years and a myocardial infarction in the past with a cardiac catheterization done in December of 1999.  PHYSICAL EXAMINATION:  Temperature 97.9 degrees, pulse 60, respirations 20, blood pressure 130/80.  HEIGHT:  6 feet 2 inches.  WEIGHT:  200 pounds.  GENERAL APPEARANCE:  The patient is alert and oriented x 3.  HEENT:  Head:  Normocephalic and atraumatic.  Eyes:  Manson Passey with pupils equal and reactive to light.   Extraocular movements intact.  The ears, nose, and throat reveal mucous membranes pink and moist.  NECK:  No JVD.  No carotid bruits.  LUNGS:  Clear bilaterally.  HEART:  Normal S1 and S2.  ABDOMEN:  Soft and nontender.  EXTREMITIES:  Negative for clubbing or cyanosis.  CENTRAL NERVOUS SYSTEM:  Cranial nerves II-XII grossly intact.  LABORATORY DATA:  Normal WBC count, platelet count, hemoglobin, and hematocrit.  The PT was normal.  The electrolytes were normal.  BUN 16, creatinine 1.2.  CK elevated at 271, MB elevated at 7.1.  Subsequent CKs and MBs were significantly down.  The troponin I was normal.  EKG:  Sinus rhythm with old anteroseptal wall MI.  The EKG was similar to the one done in 1999.  The chest x-ray revealed cardiomegaly without any active infiltrate.  The cardiac catheterization showed mild to moderate multivessel disease with a 40-50% distal LAD lesion and a 30% lesion in the RCA proximal and mid vessel area.  HOSPITAL COURSE:  The patient was admitted to the telemetry unit.  A non-Q-wave myocardial infarction was suspected with elevated CKs and MBs.  He underwent cardiac catheterization that showed mild to moderate left anterior descending coronary artery.  There was a small possibility of autolysis of clot at the distal LAD.  LV  function was preserved with an ejection fraction of 70%.  His condition remained stable throughout the hospital stay.  He was discharged home in satisfactory condition with follow-up by me in two weeks. DD:  11/18/00 TD:  11/18/00 Job: [id number]

## 2010-12-07 NOTE — Consult Note (Signed)
NAME:  [name], [name]                          ACCOUNT NO.:  [id number]   MEDICAL RECORD NO.:  [id number]                   PATIENT TYPE:  INP   LOCATION:  5502                                 FACILITY:  [facility]   PHYSICIAN:  Maree Krabbe, M.D.             DATE OF BIRTH:  Apr 01, 1926   DATE OF CONSULTATION:  03/12/2002  DATE OF DISCHARGE:                              NEPHROLOGY CONSULTATION   REASON FOR CONSULTATION:  Elevated creatinine.   HISTORY:  The patient is a 77 year old African-American male with a past  history of coronary artery disease, followed by Dr. Ricki Rodriguez.  He  apparently complained of decreased urine output several days ago, and was  given some Flomax, with some improvement; but then had no urine output, and  Dr. Algie Coffer got a renal ultrasound which showed bilateral hydronephrosis  yesterday.  He took his creatinine yesterday which was 9.  He sent him to  the emergency room today where his creatinine was 11, and the CT scan shows  again moderate bilateral hydronephrosis, but also abnormal soft tissue  densities of the retroperitoneum encased in the aorta and the inferior vena  cava, suspicious for retroperitoneal fibrosis versus lymphadenopathy.  He  also has several low-density lesions on his left kidney by CT, and  ultrasound showed one septated complex cyst at 2.5 cm on the left and a 4.0  cm simple cyst.  He has and enlarged prostate on ultrasound 4.4 cm x 5.9 cm  x 6.5 cm, but had a decompressed bladder at the time of the study.  In the  emergency room the Foley catheter was placed.  There was no urine in the  bladder.  The patient has had some right lower quadrant flank pain over the  past week.  Prior to one week ago he was apparently doing quite well,  voiding normally, and no significant chronic medical problems.  He has not  been taking any nonsteroidal's or other nephrotoxic medications.  He does  have a history of non nephrotic proteinuria  since high school.  He is  followed by his primary doctor, and occasionally referred to Dr. Fayrene Fearing L.  Deterding when the level got a little bit high, but has never had any renal  insufficiency, and his creatinine one year ago in June was 1.2, according to  Dr. Algie Coffer.   PAST MEDICAL HISTORY:  1. Myocardial infarction x2.  2. Mild non nephrotic proteinuria since high school.  3. Arthritis.   MEDICATIONS:  Xanax.   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  Three grown  sons and one grown daughter, alive and well.  He lives with his wife.  No  tobacco or alcohol use.   REVIEW OF SYSTEMS:  GENERAL:  Denies fever, chills, or weight loss, or night  sweats.  HEENT:  Denies hearing loss, visual changes, sore throat, or  difficulty swallowing.  LUNGS:  Denies a productive cough, pleuritic chest  pain, purulent sputum production, history of asthma.  CARDIAC:  As above.  No recent chest pain, orthopnea, or PND.  GI:  No nausea, vomiting, or  diarrhea.  His appetite has been good.  No abdominal pain.  GENITOURINARY:  He has had some incomplete voiding symptoms, no dysuria, no gross hematuria.  No history of bladder problems.  MUSCULOSKELETAL:  Mild arthritis of the  knees.  Otherwise unremarkable.  NEUROLOGIC:  No history of stroke, TIA, or  seizure.  No focal neurological complaints.   PHYSICAL EXAMINATION:  VITAL SIGNS:  Afebrile, blood pressure 130/80, pulse  80, respirations 20.  GENERAL:  This is an alert, well-developed African-American male, resting  comfortably, in no distress whatsoever, not toxic in appearance.  SKIN:  A darkened skin area in the suprapubic region which the wife says is  due to a reaction to metal belt buckles.  HEENT:  PERRLA.  EOMI.  Throat is clear.  NECK:  Supple, no lymphadenopathy.  CHEST:  Clear throughout.  CARDIAC:  A regular rate and rhythm without murmur, rub, or gallop.  ABDOMEN:  Soft, nontender.  No masses are palpated.  No  organomegaly.  EXTREMITIES:  No peripheral edema.  NEUROLOGIC:  Alert and oriented x3.  A grossly nonfocal exam.   LABORATORY DATA:  Sodium 120, potassium 4.2, BUN 4, creatinine 11.4.  Hemoglobin 12, white count 5000, platelets 215.  Creatinine 9.6 yesterday.  Albumin 2.5, total protein 7.2, uric acid 9.7, calcium 7.8.  Urinalysis:  No  protein, no rbc's, no white blood cells.   RADIOLOGIC:  As noted above.   IMPRESSION:  1. Acute renal failure due to bilateral hydronephrosis.  This appears to be     due to ureteral encasement with a retroperitoneal process, which is     suspicious for metastatic disease (versus retroperitoneal fibrosis).  2. Complex renal cysts on the left.  Rule out renal cell carcinoma.  3. Mild longstanding proteinuria since high school, unrelated to his current     problems.  4. Benign prostatic hypertrophy by ultrasound.  No evidence of blood or     outlet obstruction clinically.   RECOMMENDATIONS:  1. Bilateral percutaneous nephrostomy today.  2. Urology consultation with Dr. Lynelle Smoke I. Patsi Sears was placed.  3. No need for dialysis at this time.  4. IV fluids at 100 cc an hour.  5. Would hope for a significant improvement in the azotemia, with relief of     obstruction.  6. Workup of the peritoneal process and renal cysts.   Thank you for the consultation.                                               Maree Krabbe, M.D.    RDS/MEDQ  D:  03/12/2002  T:  03/15/2002  Job:  [id number]

## 2010-12-07 NOTE — Discharge Summary (Signed)
[name], [name] NO.:  [id number]   MEDICAL RECORD NO.:  [id number]          PATIENT TYPE:  INP   LOCATION:  5002                         FACILITY:  [facility]   PHYSICIAN:  Ricki Rodriguez, M.D.  DATE OF BIRTH:  12/03/1925   DATE OF ADMISSION:  01/07/2008  DATE OF DISCHARGE:  01/11/2008                               DISCHARGE SUMMARY   HOSPITAL LOCATION:  5002, bed 1.   FINAL DIAGNOSES:  1. Gouty arthropathy.  2. Coronary atherosclerosis with native coronary vessel.  3. Hypertensive renal disease.  4. Chronic kidney disease, stage III.  5. Old myocardial infarction.  6. Eberlin-term use of aspirin.   PRINCIPLE PROCEDURE:  Arthrocentesis and injection of steroid by Dr.  Teryl Lucy.   DISCHARGE MEDICATIONS:  1. Colchicine 0.6 mg 1 daily.  2. Plavix 75 mg 1 daily.  3. Lasix 40 mg 1 daily.  4. Imdur 60 mg 1 daily.  5. Lisinopril 40 mg in the a.m., 20 mg in the p.m. by mouth.  6. Xanax 0.25 mg 1/2 twice daily.  7. Norvasc 5 mg 1 daily.  8. Lipitor 10 mg 1 in the evening.  9. Tramadol 50 mg 1 twice daily.  10.Aspirin 81 mg daily.  11.Prednisone 10 mg 1 daily.  12.Atacand 32 mg 1 daily.   DISCHARGE DIET:  Low-sodium, heart-healthy diet.   DISCHARGE ACTIVITY:  The patient to increase activity slowly as  tolerated.   SPECIAL INSTRUCTIONS:  1. The patient to stop any activity that causes chest pain, shortness      of breath, dizziness, sweating, or excessive weakness.  2. Follow up by Dr. Orpah Cobb in 1 month.  The patient to call [phone] for appointment.  3. Additional follow up by Dr. Sherlean Foot or Dr. Dion Saucier in 2-3 weeks.   HISTORY:  This 77 year old black male presented with left knee pain and  swelling without any injury, He also had a mild fever.   PHYSICAL EXAMINATION:  VITAL SIGNS:  Temperature 100 degrees Fahrenheit,  pulse 80, respiration 14, blood pressure 137/82, he is 6 feet 2 inches  tall, and weight approximately 185 pounds.  GENERAL:  The patient is well-built, averagely-nourished black male in  no distress.  HEENT:  The patient is normocephalic, atraumatic with brown eyes.  Partial upper dentures.  NECK:  No JVD.  No carotid bruits.  LUNGS:  Clear.  HEART:  Normal S1, S2 with grade 2/6 systolic murmur.  ABDOMEN:  Soft and nontender.  EXTREMITIES:  No edema, cyanosis, or clubbing, except for mild arthritic  changes of both hands and left knee swelling along with tenderness and  warmth.  SKIN:  Warm and dry.  NEUROLOGIC:  Cranial nerves grossly intact.  The patient had bilateral  equal grips and he is right handed.   LABORATORY DATA:  Normal hemoglobin and hematocrit.  WBC count and  platelet count were slightly low.  Sodium of 132, potassium 4.1, BUN 34,  creatinine 2.56, and sugar 109.   Chest x-ray; no acute process with small nodule in the left lung.  X-ray  of the left knee revealed a minimal effusion and possible CPPD disease.   HOSPITAL COURSE:  The patient was admitted to orthopedic floor.  He had  blood cultures. IV antibiotics was started and his home medications were  started.  Because of suspicion of possible infection of the left knee  joint, orthopedic consult of Dr. Roselie Awkward was obtained.  The patient  had left knee arthrocentesis.  It turned out that the patient had gouty  arthropathy.  He had significant improvement with the steroid injection  in the knee.  His antibiotics were discontinued and he was discharged  home in satisfactory condition with a followup by me in 2 weeks.      Ricki Rodriguez, M.D.  Electronically Signed     ASK/MEDQ  D:  03/17/2008  T:  03/17/2008  Job:  [id number]

## 2010-12-07 NOTE — Discharge Summary (Signed)
   NAME:  [name], [place]                          ACCOUNT NO.:  [id number]   MEDICAL RECORD NO.:  [id number]                   PATIENT TYPE:  INP   LOCATION:  0362                                 FACILITY:  [facility]   PHYSICIAN:  Sigmund I. Patsi Sears, M.D.         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  2. Retroperitoneal fibrosis.   OPERATION:  On April 08, 2002 the operation was exploratory laparotomy  with externalization of the retroperitoneal fibrotic ureters and left  adrenalectomy.   HISTORY OF PRESENT ILLNESS:  [name] is a 77 year old married black male  recently evaluated at [facility] with anemia and found to have  bilateral hydronephrosis. He had bilateral percutaneous nephrostomies placed  with internalization and now is admission for exploratory laparotomy with  diagnosis of possible retroperitoneal fibrosis and adrenal mass, which may  be independent factors.   PAST MEDICAL HISTORY:  Significant for tobacco abuse, but no alcohol abuse.   SOCIAL HISTORY:  The patient lives at home with his wife. He has a  supportive family.   PHYSICAL EXAMINATION:  Is as noted in the previously dictated history and  physical.   HOSPITAL COURSE:  On the day of admission, the patient underwent exploratory  laparotomy with the finding of __ and fibrosis bilaterally. He had  externalization of the ureters bilaterally and wrapped in omentum. In  addition, the patient had a left adrenalectomy with the finding of ganglia  neuroma of the peri adrenal gland. He had an ileus postoperatively,  secondary to hypokalemia, but the ileus resolved with admission of  potassium. The patient tolerated diet well and was allowed to be discharged  in stable condition on April 23, 2002. He will return to Dr. Patsi Sears  for  wound care.                                               Sigmund I. Patsi Sears, M.D.    SIT/MEDQ  D:  05/10/2002  T:  05/10/2002  Job:  [id number]

## 2010-12-07 NOTE — Discharge Summary (Signed)
   NAME:  [name], [name]                          ACCOUNT NO.:  [id number]   MEDICAL RECORD NO.:  [id number]                   PATIENT TYPE:  INP   LOCATION:  0374                                 [facility]ACILITY:  [facility]   PHYSICIAN:  Sigmund I. Patsi Sears, M.D.         DATE OF BIRTH:  22-Sep-1925   DATE OF ADMISSION:  04/25/2002  DATE OF DISCHARGE:  04/30/2002                                 DISCHARGE SUMMARY   FINAL DIAGNOSES:  1. Postoperative ileus.  2. Anemia.  3. Retroperitoneal fibrosis.  4. Ganglia neuroma of the left para-adrenal gland.  5. Hypertension.  6. Severe cardiac disease.  7. Anxiety.   OPERATION:  None.   HISTORY OF PRESENT ILLNESS:  The patient is a 77 year old African-American  male, who underwent exploratory laparotomy and resection of left adrenal  mass and bilateral ureterolysis with externalization of the ureters,  wrapping them in omentum on 04/08/02.  He was allowed to be discharged, but  now returns with an ileus/partial small bowel obstruction, and is admitted  from the emergency room on 04/25/02, with inability to eat, nausea and  vomiting.   PAST MEDICAL HISTORY:  1. Coronary artery disease.  2. History of myocardial infarction x2.   MEDICATIONS:  1. Medrol, given in low dose for acute gout attack.  2. Toprol.  3. Flomax.   ALLERGIES:  IBUPROFEN.   SOCIAL HISTORY:  Noncontributory.   FAMILY HISTORY:  Noncontributory.   HOSPITAL COURSE:  The patient was admitted to Dr. Imelda Pillow service where  he underwent NG placement, and follow up of his ileus.  This showed that the  ileus resolved on its own with replacement of potassium.  The patient then  slowly progressed to eat a regular diet, and was allowed to be discharged on  04/30/02, eating regular food, and in stable condition.  His gout was under  good control.                                              Sigmund I. Patsi Sears, M.D.   SIT/MEDQ  D:  05/10/2002  T:  05/10/2002  Job:   [id number]

## 2010-12-07 NOTE — H&P (Signed)
NAME:  [name], [name]                          ACCOUNT NO.:  [id number]   MEDICAL RECORD NO.:  [id number]                   PATIENT TYPE:  INP   LOCATION:  0362                                 FACILITY:  [facility]   PHYSICIAN:  Sigmund I. Patsi Sears, M.D.         DATE OF BIRTH:  [birth year]   DATE OF ADMISSION:  04/08/2002  DATE OF DISCHARGE:  04/23/2002                                HISTORY & PHYSICAL   HISTORY OF PRESENT ILLNESS:  The patient is a 77 year old African-American  male, who recently presented to [facility] with anuria and acute  renal failure.  His serum creatinine was initially above 10.0, and he was  found to have bilateral ureteral obstruction, and subsequently underwent  bilateral percutaneous nephrostomy drainage.  He subsequently had  internalization of the ureteral stents, and maintained a serum baseline  creatinine of 1.5.  He was then able to be discharged home, and radiographic  imaging while in the hospital showed a left adrenal mass measuring 7 cm in  greatest diameter.  The mass did not appear to be an adenoma by radiologic  imaging, and was worrisome for possible malignancy.  Percutaneous biopsy was  performed and was non-diagnostic.  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  After discussing the findings with the patient and his family, it was  elected to proceed with exploratory laparotomy, with biopsy of the patient's  retroperitoneal mass to rule out malignancy, and proceed with bilateral  ureterolysis, and left adrenal gland mass removal.  He was allowed to be  discharged from [facility], and then readmitted to [facility] for surgical procedure.  He is admitted on the day of his surgery  for abdominal exploration.   LABORATORY DATA:  Admission laboratories show a white blood cell count of  5400, hemoglobin of 9.8, hematocrit 28.3, platelet count  395,000.  Sodium  136, potassium 4.3, chloride 104, CO2 27, BUN 18, creatinine 2.0.   ADMISSION MEDICATIONS:  1. Toprol XL 25 mg one q.d.  2. Nitroglycerin 0.4 mg patch on 12 hours and off h.s.  3. Trimethoprim 100 mg q.d.  4. Lipitor 10 mg q.d.  5. Promethazine 25 mg one q.i.d.  6. KCL 20 mEq q.d.  7. Flomax 0.4 mg q.d.  8. Urised one t.i.d. p.r.n. spasm and urinary burning.  9. Iron one p.o. q.d.  10.      Darvocet one q.4-6h. p.r.n. pain.  11.      Zyrtec one p.o. q.d.   HABITS:  Alcohol:  None.  Tobacco:  A 20 pack year history, but none in 29  years.   SOCIAL HISTORY:  The patient lives at home with his wife.  He has a very  supportive family.   PAST SURGICAL HISTORY:  1. Salivary gland removal.  2. Bilateral ureteral stents.  3. Cardiac catheterization in 4/02 (Dr. Algie Coffer) with  multiple balloon     angioplasties and stent placements.  4. History of blood transfusion for anemia.   PHYSICAL EXAMINATION:  GENERAL:  A thin black male, somewhat anxious, in no  acute distress.  VITAL SIGNS:  Temperature 98.4, pulse 88, respiratory rate 18, blood  pressure 118/80, SAO2 100%, weight 187 pounds.  HEENT:  Pupils equal, round, reactive to light and accommodation.  Extraocular movements are full.  NECK:  Supple, nontender, no nodes.  CHEST:  Clear to auscultation and percussion.  ABDOMEN:  Soft, positive bowel sounds, no organomegaly or masses.  Flank is  negative.  GENITALIA:  Normal male external genitalia.  The penis is normal, the  urethra is normal.  Testicles are descended bilaterally and measure 4 x 4 cm  and nontender.  RECTAL:  A 3+ lobular benign prostate.  EXTREMITIES:  Without cyanosis or edema.  NEUROLOGIC:  Physiologic.   IMPRESSION:  1. Adrenal mass.  2. Retroperitoneal fibrosis.   PLAN:  Admit via OR.                                                 Sigmund I. Patsi Sears, M.D.    SIT/MEDQ  D:  05/10/2002  T:  05/10/2002  Job:  [id number]

## 2010-12-07 NOTE — H&P (Signed)
NAME:  [name], [name] NO.:  [id number]   MEDICAL RECORD NO.:  [id number]                   PATIENT TYPE:  EMS   LOCATION:  ED                                   FACILITY:  [facility]   PHYSICIAN:  Valetta Fuller, M.D.               DATE OF BIRTH:  03-27-1926   DATE OF ADMISSION:  04/25/2002  DATE OF DISCHARGE:                                HISTORY & PHYSICAL   CHIEF COMPLAINT:  Nausea and vomiting, abdominal distention.   HISTORY OF PRESENT ILLNESS:  The patient is a 77 year old African-American  male.  He has a complex urologic history.  The patient underwent an  exploratory laparotomy with resection of a left adrenal mass and  ureterolysis back on 04/08/02.  This was done by Dr. Patsi Sears.  The patient  had presented to [facility] with anuria and acute renal failure.  His serum  creatinine was initially above 10.0, and he had bilateral ureteral  obstruction.  He had bilateral percutaneous nephrostomy drainage with  internalization of ureteral stents and his serum creatinine returned to a  baseline of approximately 1.5.  The patient also had a left adrenal mass  measuring approximately 7 cm in greatest diameter.  Percutaneous biopsy was  non-diagnostic.  The patient presented for abdominal exploration to identify  the retroperitoneal mass and to proceed with ureterolysis if indeed the  patient had retroperitoneal fibrosis which was thought to be the case.  Surgery itself was complicated.  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  He did undergo ureterolysis, and there was quite a bit  of fibrosis, and it was a difficult operation.  Postoperatively, the patient  required some intubation and ventilation.  The remainder of his  postoperative course was mostly notable for a prolonged ileus/partial small  bowel obstruction which eventually resolved.  The patient was also diagnosed  with gout.  He was eventually discharged approximately two  days ago.  At  that time, he was tolerating a diet reasonably well and had flatus and a  bowel movement on several occasions.  He went home, and has continued to  have some flatus and a small bowel movement, but has had increased abdominal  distention with nausea and emesis, and has not been able to keep p.o.  intake.  He presents now to the emergency room for evaluation.  Abdominal  films obtained in the ER showed what appears to be most likely an ileus  pattern with some air fluid levels in the colon and air down to the rectum.  There are also some dilated small bowel loops and air fluid levels in the  small bowel as well.   PAST MEDICAL HISTORY:  Coronary artery disease.  He has had a myocardial  infarction x2.  His other medical history is relatively unremarkable.   MEDICATIONS:  1. Some Medrol which was given at a  low dose for his gout since he is     intolerant of non-steroidal anti-inflammatories.  2. Toprol.  3. Flomax.   ALLERGIES:  IBUPROFEN.   SOCIAL HISTORY:  Otherwise noncontributory.   FAMILY HISTORY:  Otherwise noncontributory.   PHYSICAL EXAMINATION:  GENERAL:  He is an elderly appearing African-American  male.  VITAL SIGNS:  He is afebrile.  His blood pressure is 127/86, pulse 93.  His  O2 saturation was 99%.  ABDOMEN:  Distended, but soft.  There is no tenderness, and incision appears  to be healing well.  GENITOURINARY:  External genitalia is unremarkable.  EXTREMITIES:  No significant abnormalities.   LABORATORY DATA:  Hemoglobin is 10.1, white blood cell count of 10.0.  His  BMET is notable for a potassium of 3.5, BUN and creatinine of 12 and 1.4,  respectively.   ASSESSMENT:  Probable ongoing postoperative ileus versus partial small bowel  obstruction.  His abdomen is somewhat distended, but otherwise soft.  His  belly was fairly quiet on examination today, but apparently the family has  noticed quite a bit of rumbling and gurgling sounds in his  abdomen.  Given  the amount of air and fluid in the colon, one suspects that this is probably  more a resolving ileus with some uncoordinated bowel activity rather than a  small bowel obstruction, but obviously early partial small bowel obstruction  would be difficult to rule out at this point.  A NG tube will be placed and  he will have decompression.  He will receive IV antibiotics and serial  abdominal films and exams.  He will get potassium replacement.                                                Valetta Fuller, M.D.    DSG/MEDQ  D:  04/25/2002  T:  04/25/2002  Job:  [id number]   cc:   Lynelle Smoke I. Patsi Sears, M.D.

## 2010-12-07 NOTE — Discharge Summary (Signed)
NAME:  [name], [name]                          ACCOUNT NO.:  [id number]   MEDICAL RECORD NO.:  [id number]                   PATIENT TYPE:  INP   LOCATION:  5502                                 FACILITY:  [facility]   PHYSICIAN:  Ricki Rodriguez, M.D.               DATE OF BIRTH:  Dec 06, 1925   DATE OF ADMISSION:  03/12/2002  DATE OF DISCHARGE:  03/30/2002                                 DISCHARGE SUMMARY   PRINCIPAL DIAGNOSES:  1. Hematuria.  2. Anemia of blood loss.  3. Abdominal mass.  4. Hypertension.   DISCHARGE MEDICATIONS:  1. Darvocet up to 4 times daily as needed for pain.  2. Xanax 0.25 mg 1 at bedtime as needed.  3. Flexeril 5 mg at bedtime as needed.  4. Zyrtec 10 mg 1 daily as needed.  5. Nitroglycerin patch 0.4 mg per hour 1 daily, remove at bedtime.  6. Toprol XL 25 mg 1 daily.  7. Potassium 20 mEq 1 daily.  8. Ferrous sulfate 325 mg 1 daily.  9. Lipitor 10 mg 1 daily.  10.      Flomax 0.4 mg 1 at bedtime.   ACTIVITY:  As tolerated.   DIET:  Low-fat, low-salt diet.   WOUND CARE:  The patient is to do dressing changes as directed.   SPECIAL INSTRUCTIONS:  The patient is to have CBC, BNP done on outpatient  basis in one week.   FOLLOW UP:  Ricki Rodriguez, M.D. in one week and Sigmund I. Patsi Sears,  M.D. as arranged for exploration of the abdomen for abdominal mass  evaluation.   CONDITION ON DISCHARGE:  Stable.   HISTORY OF PRESENT ILLNESS:  This 77 year old black male presented with  right-sided abdominal pain for two days.  One day before that, the patient  had a right severe pain with some difficulty in voiding.  The patient had a  history of proteinuria for many years, has a history of hypertension for  nine years, and myocardial infarction along with cardiac catheterization in  December 1999.  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   PHYSICAL EXAMINATION:  VITAL SIGNS:  Temperature 97.5, pulse 54,  respirations 20, blood pressure  115/73.  Height 6 feet 2 inches, weight 180  pounds.  GENERAL:  Alert and oriented x 3.  HEENT  Head normocephalic with frontal baldness.  Eyes brown with pupils  reactive to light.  Extraocular movements intact.  Ear, nose and throat:  Mucous membranes grossly intact.  NECK:  No JVD or carotid bruit.  LUNGS:  Clear bilaterally.  HEART:  Normal S1 and S2.  ABDOMEN:  Soft with mild right-sided tenderness.  EXTREMITIES:  No cyanosis, clubbing, or edema.  CNS:  Cranial nerves II-XII grossly intact.   LABORATORY DATA:  Sodium128, potassium 4.3, BUN 84, creatinine 11.4.  Hemoglobin 12, hematocrit 35, WBC 5300, platelet count 215,000.  Uric  acid  elevated at 9.7.  Albumin low at 2.5, calcium 7.8.  UA negative for blood,  protein, and leukocyte.  Subsequent hemoglobin was down to 8.6 post  procedure with complication on 03/21/2002, and on 03/29/2002, it was 8.9.  ESR  was elevated 120.  Subsequent BUN and creatinine down to 25 and 1.9 on  03/17/2002 and down to 16 and 1.5 on 03/29/2002.  PSA was normal at 2.56.  Lillie Fragmin protein.  Qualitative evaluation showed total protein of 6 g, and  urine was negative for monoclonal light chain protein.  Blood group was O  positive, negative for antibody screen.  Blood cultures were negative.   Ultrasound of the abdomen showed a moderate bilateral hydronephrosis and  unremarkable gallbladder.   Nephrostogram done on 03/12/2002 demonstrated a mid left ureteral  obstruction.   X-ray of abdomen on 03/13/2002 showed nonobstructed bowel gas pattern.   MRI of abdomen and pelvis showed prostate gland mildly enlarged and urinary  bladder decompressed by  Foley catheter and left vaginal mass. Bilateral  renal cystic structure.   CT of the abdomen and pelvis revealed normal soft tissue from the aorta  continuous down around the iliac vessels and  soft tissue density adjacent  to the left iliac vessel, question enlarged lymph nodes.   HOSPITAL COURSE:  The  patient was admitted to telemetry unit.  Nephrology  and urology consults were obtained along with a special procedure consult pf  radiologist.  The patient had significant hematuria with difficulty in  placing the nephrostomy tube on the left side.  This continued over the next  five to six days of hospitalization with slow clearing of urine on the right  nephrostomy bag with attempt to put nephrostomy tube on the left side was  carried out on 03/13/2002 by Dr. Tiburcio Pea.  The patient had remarkable  improvement in creatinine with in 72 to 96 hours.  He underwent additional  investigations to evaluate retroperitoneal lymphadenopathy .  However, this  remained nonconclusive.   CT-guided biopsy was done without conclusive evidence of a mass.  He  acquired 2 units for blood transfusion.  Hemoglobin dropped as low a 7.5 on  03/26/2002.  After 10 to 11 days of hospital admission, the patient's  condition improved.  The left urine was clearing.  He underwent  CT-guided,  19-gauge needle biopsy on 03/23/2002.  The biopsy results were indeterminate  for cancer.  He had a stent placed by interventional radiologist on  03/24/2002.  Again there was technical difficulty in placing the stent on the  left side.  The patient had significant hematuria requiring transfusion on  03/26/2002.  On 03/29/2002,  the right-sided nephrostomy tube was removed, and  the left-sided drainage stent was placed.  The patient had significant  clearing of urine and his condition remained stable.  Hence on 03/30/2002, he  was discharged home with additional intervention arranged by urologist, Dr.  Patsi Sears, after giving rest of one or two weeks  to the patient at home.  The patient was placed on iron supplements and pain medication with  medication for anxiety, potassium, and nitroglycerin.    CONDITION ON DISCHARGE:  Markedly improved form kidney failure standpoint and somewhat stable post complications of procedures with good  family  support.  The patient went home on 03/24/2002 with additional workup to be  done by the urologist as arranged.  Ricki Rodriguez, M.D.    ASK/MEDQ  D:  04/17/2002  T:  04/20/2002  Job:  [id number]

## 2010-12-07 NOTE — Op Note (Signed)
NAME:  [name], [name]                          ACCOUNT NO.:  [id number]   MEDICAL RECORD NO.:  [id number]                   PATIENT TYPE:  INP   LOCATION:  0163                                 FACILITY:  [facility]   PHYSICIAN:  Sigmund I. Patsi Sears, M.D.         DATE OF BIRTH:  [birth year]   DATE OF PROCEDURE:  04/08/2002  DATE OF DISCHARGE:                                 OPERATIVE REPORT   PREOPERATIVE DIAGNOSES:  1. Bilateral ureteral obstruction.  2. Retroperitoneal mass.  3. Left adrenal mass.   POSTOPERATIVE DIAGNOSES:  1. Bilateral ureteral obstruction.  2. Retroperitoneal mass.  3. Left adrenal mass.   PROCEDURES:  1. Exploratory laparotomy.  2. Bilateral ureterolysis with omental wrap.  3. Resection of left adrenal mass.   SURGEON:  Sigmund I. Patsi Sears, M.D.   ASSISTANT:  Crecencio Mc, MD   ANESTHESIA:  General.   ESTIMATED BLOOD LOSS:  600 cc   IV FLUIDS:  6200 cc of lactated Ringer's and 2 units of packed red blood  cells.   DRAINS:  Foley catheter.   SPECIMENS:  1. Left adrenal mass.  2. Right retroperitoneal mass.  3. Left retroperitoneal mass.   COMPLICATIONS:  None.   INDICATIONS FOR PROCEDURE:  [name] is a 77 year old African-American  gentleman who recently presented to [facility] with anuria and  acute renal failure. The patient's serum creatinine was initially above  10.0. The patient was found to have bilateral ureteral obstruction and  subsequently underwent bilateral percutaneous nephrostomy drainage. He  subsequently had internalization of ureteral stents and maintained a serum  creatinine at his baseline of 1.5. The patient was subsequently able to be  discharged home. On his radiologic imaging while in the hospital, the  patient was also noted to have an incidental left adrenal mass measuring  approximately 7 cm in greatest diameter. This mass did not appear to be an  adenoma by radiologic imaging and was worrisome for a  possible adrenal  malignancy. Percutaneous biopsy of this was performed and was nondiagnostic.  After discussing all these findings with the patient and his family, it was  decided to proceed with an exploratory laparotomy with biopsy of the  patient's retroperitoneal mass to rule out malignancies. We would then  proceed with bilateral ureterolysis if this was consistent with  retroperitoneal fibrosis. In addition, we planned to remove the left adrenal  gland and mass during the procedure. Potential risks and benefits of this  procedure were explained to the patient as well as his family and they did  consent.   DESCRIPTION OF PROCEDURE:  The patient was taken to the operating room and a  general anesthetic was administered. The patient was administered  preoperative antibiotics, placed in the supine position and prepped and  draped in the usual sterile fashion. Next, a midline incision was made from  just below the xiphoid process down the midline taken to the left of  the  umbilicus and down to the pubic symphysis. This was then carried down  through the subcutaneous tissues with Bovie electrocautery. The anterior  rectus fascia was then divided in the midline and the rectus muscles were  separated. The posterior rectus fascia was then identified and the  peritoneal cavity was sharply entered. The remainder of the incision was  then opened in a superior and inferior direction. The self retraction  Omnitract retractor was then placed. Attention was first turned to the left  side of the abdomen. The white line of Toldt in the left pericolic gutter  was identified and sharply incised allowing the left colon to be reflected  medially. This exposed the retroperitoneum where the left kidney was  identified. The left psoas muscle was then also identified and using blunt  and sharp dissection, the left ureter was able to be palpated with a double  J ureteral stent in place. The left ureter was  noted to be encased in a very  hard, dense, and fibrotic mass just above the level of the iliac vessels and  extending approximately 4-5 cm superiorly. The ureter was identified  inferiorly to its encasement in this mass and was dissected free with a  right angle and a Vesseloop was placed around it. The ureter was also  identified superior to this mass and the ureter was isolated and a Vesseloop  placed around it in a similar fashion. A right angle was then used to  dissect the dense fibrotic tissue from the underlying ureter. This was able  to be peeled away from the ureter without much difficulty. Once the ureter  was completely freed, a portion of this mass was sharply removed and sent to  pathology for pathologic diagnosis. Frozen sections returned as probable  fibrotic tissue. However, malignancy could not be completely excluded.  Therefore, we decided to proceed with bilateral ureterolysis in the event  that it was indeed a benign fibrotic process. Attention then turned to the  left adrenal gland. Dissection was carried up the left ureter to the left  renal pelvis. The left renal vein was identified as well as the left gonadal  vein which was identified, ligated and divided. Just superior to the left  renal vein was a dense, light tan circular mass which was just inferior to  what appeared to be a normal appearing adrenal gland. However, this mass was  intimately associated with the adrenal gland. Therefore using blunt and  sharp dissection, this mass was carefully dissected free. The lateral border  was able to be dissected without much difficulty as well as the superior  border. There were noted to be some extremely large vessels associated with  this mass. The adrenal vein was identified and this was ligated and divided.  A lumbar vein entering the left renal vein was also identified and this was  ligated and divided. Dissection continued medially and the mass was noted to extend  anterior to the aorta and posterior to the inferior vena cava. It was  decided to send a frozen section of this mass as complete resection did not  appear possible. 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 It was therefore decided to resect this mass as well as it could  be resected without excessive morbidity. Therefore, the left adrenal glad  with this associated mass was able to be removed. However, the extension of  this mass into encasing fascia around the great vessels was left  in place.  This mass was further examined for frozen section analysis and pathology.  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  Attention then returned to the left ureter after hemostasis was ensured. It  was decided to place an omental wrap around the left ureter. Therefore, the  omentum was divided in the midline. Kelly clamps were placed on either side  of the midline of the omentum and it was divided and these areas were  ligated with 2-0 silk suture. Once the omentum was divided in half, the left  portion of the omentum was brought down into the left retroperitoneum which  it was able to reach without difficulty. The omentum was then placed in an  encircling fashion around the left ureter and tacked to itself with 3-0  Vicryl sutures. In addition, a 3-0 Vicryl tacking suture was placed between  the omentum and the psoas fascia. Attention then turned to the right side of  the abdomen. The white line of Toldt was again sharply taken down and the  right colon was reflected medially. The right ureter was identified in a  similar fashion to the contralateral side and there was noted to be a  similar portion of the ureter involved with an encasing fibrotic process as  on the contralateral side. However, this process only encased the ureter for  approximately 2-3 cm on this side. The ureter was dissected free in a  similar fashion on the  contralateral side and an omental wrap was placed as  well. The abdomen was inspected and there did not appear to be any other  sign of visceral metastases due to a possible primary malignancy. The  nasogastric tube was able to be palpated in the stomach and was in good  position. The abdomen was the irrigated and preparations for closure were  made. The rectus fascia was then closed with a running #1 PDS suture. The  wound was then again irrigated and the skin was reapproximated with staples.  All sponge and needle counts were correct. The patient appeared to tolerate  the procedure well and without complications. He was then subsequently  transferred to the intensive care unit in stable condition. Please note that  Dr.  Jethro Bolus was the operating surgeon and was present and  participated in this entire procedure.     Crecencio Mc, MD                            Sigmund I. Patsi Sears, M.D.    LB/MEDQ  D:  04/08/2002  T:  04/09/2002  Job:  [id number]

## 2010-12-07 NOTE — Cardiovascular Report (Signed)
[place]. [facility]  Patient:    [name], [name]                       MRN: [id number] Proc. Date: 11/17/00 Adm. Date:  [id number] Disc. Date: [id number] Attending:  Orpah Cobb S                        Cardiac Catheterization  PROCEDURES:  Left heart catheterization, selective coronary angiography and left ventricular function study.  INDICATIONS:  This 77 year old, black male, presented with chest pain and had abnormal cardiac enzymes suggestive of non-Q-wave myocardial infarction.  APPROACH:  Right femoral artery using 6 French diagnostic catheters.  COMPLICATIONS:  None.  HEMODYNAMIC DATA:  The left ventricular pressure was 120/17 mmHg.  The aortic pressure was 122/70 mmHg.  LEFT VENTRICULOGRAM:  The left ventriculogram showed normal systolic function with ejection fraction of 70%.  CORONARY ANATOMY:  Left main:  The left main coronary artery was short with a mild ectasia of both left and right coronary arteries.  Left left anterior descending:  Left anterior descending coronary artery had a mild distal disease, ______ to distal two-thirds of the vessel, then after the bend in the artery there was another 40-50% concentric lesion.  The diagonal vessel had ostial 30% stenosis.  Left circumflex:  The left circumflex coronary artery had mild diffuse disease.  It continued at obtuse marginal branch which then had a trifurcation with normal arteries.  Right coronary artery:  The right coronary artery was also mildly ectatic and had 30% eccentric proximal disease, 30% mid vessel disease, and mild distal disease.  The posterolateral and posterior descending coronary artery were smaller without any disease.  IMPRESSION: 1. Mild to moderate multivessel coronary artery disease. 2. Moderate left ventricular systolic function.  RECOMMENDATIONS:  This patient will be treated medically for now. DD:  11/17/00 TD:  11/17/00 Job: [id number]

## 2010-12-19 ENCOUNTER — Other Ambulatory Visit: Payer: Self-pay | Admitting: Cardiovascular Disease

## 2010-12-19 ENCOUNTER — Ambulatory Visit
Admission: RE | Admit: 2010-12-19 | Discharge: 2010-12-19 | Disposition: A | Discharge status: Home / Self Care | Payer: Medicare Other | Source: Ambulatory Visit | Attending: Cardiovascular Disease | Admitting: Cardiovascular Disease

## 2010-12-19 DIAGNOSIS — R062 Wheezing: Secondary | ICD-10-CM

## 2011-04-06 ENCOUNTER — Emergency Department (HOSPITAL_COMMUNITY): Payer: Medicare Other

## 2011-04-06 ENCOUNTER — Emergency Department (HOSPITAL_COMMUNITY)
Admission: EM | Admit: 2011-04-06 | Discharge: 2011-04-06 | Disposition: A | Discharge status: Home / Self Care | Payer: Medicare Other | Attending: Emergency Medicine | Admitting: Emergency Medicine

## 2011-04-06 DIAGNOSIS — R55 Syncope and collapse: Secondary | ICD-10-CM | POA: Insufficient documentation

## 2011-04-06 DIAGNOSIS — Z9889 Other specified postprocedural states: Secondary | ICD-10-CM | POA: Insufficient documentation

## 2011-04-06 DIAGNOSIS — I10 Essential (primary) hypertension: Secondary | ICD-10-CM | POA: Insufficient documentation

## 2011-04-06 DIAGNOSIS — M129 Arthropathy, unspecified: Secondary | ICD-10-CM | POA: Insufficient documentation

## 2011-04-06 DIAGNOSIS — I252 Old myocardial infarction: Secondary | ICD-10-CM | POA: Insufficient documentation

## 2011-04-06 DIAGNOSIS — I2510 Atherosclerotic heart disease of native coronary artery without angina pectoris: Secondary | ICD-10-CM | POA: Insufficient documentation

## 2011-04-06 DIAGNOSIS — R404 Transient alteration of awareness: Secondary | ICD-10-CM | POA: Insufficient documentation

## 2011-04-06 DIAGNOSIS — E7800 Pure hypercholesterolemia, unspecified: Secondary | ICD-10-CM | POA: Insufficient documentation

## 2011-04-06 DIAGNOSIS — R5381 Other malaise: Secondary | ICD-10-CM | POA: Insufficient documentation

## 2011-04-06 DIAGNOSIS — R42 Dizziness and giddiness: Secondary | ICD-10-CM | POA: Insufficient documentation

## 2011-04-06 LAB — POCT I-STAT, CHEM 8
Calcium, Ion: 1.08 mmol/L — ABNORMAL LOW (ref 1.12–1.32)
Glucose, Bld: 72 mg/dL (ref 70–99)
HCT: 30 % — ABNORMAL LOW (ref 39.0–52.0)
Hemoglobin: 10.2 g/dL — ABNORMAL LOW (ref 13.0–17.0)
TCO2: 26 mmol/L (ref 0–100)

## 2011-04-06 LAB — CBC
MCHC: 36 g/dL (ref 30.0–36.0)
MCV: 77.9 fL — ABNORMAL LOW (ref 78.0–100.0)
Platelets: 175 10*3/uL (ref 150–400)
RDW: 19.5 % — ABNORMAL HIGH (ref 11.5–15.5)
WBC: 7.1 10*3/uL (ref 4.0–10.5)

## 2011-04-06 LAB — DIFFERENTIAL
Basophils Absolute: 0 10*3/uL (ref 0.0–0.1)
Eosinophils Absolute: 0.2 10*3/uL (ref 0.0–0.7)
Eosinophils Relative: 3 % (ref 0–5)

## 2011-04-06 LAB — POCT I-STAT TROPONIN I

## 2011-04-09 ENCOUNTER — Other Ambulatory Visit: Payer: Self-pay | Admitting: Nephrology

## 2011-04-09 DIAGNOSIS — R918 Other nonspecific abnormal finding of lung field: Secondary | ICD-10-CM

## 2011-04-16 ENCOUNTER — Inpatient Hospital Stay
Admission: RE | Admit: 2011-04-16 | Discharge: 2011-04-16 | Payer: Medicare Other | Source: Ambulatory Visit | Attending: Nephrology | Admitting: Nephrology

## 2011-04-17 LAB — BASIC METABOLIC PANEL
BUN: 26 — ABNORMAL HIGH
CO2: 27
Calcium: 8.3 — ABNORMAL LOW
Creatinine, Ser: 2.5 — ABNORMAL HIGH
GFR calc Af Amer: 30 — ABNORMAL LOW

## 2011-04-18 LAB — CBC
HCT: 31.7 — ABNORMAL LOW
Hemoglobin: 12.1 — ABNORMAL LOW
MCHC: 34.6
MCHC: 34.8
MCV: 87.9
MCV: 87.9
Platelets: 164
RBC: 3.61 — ABNORMAL LOW
RBC: 3.96 — ABNORMAL LOW
RDW: 14.9
RDW: 15
WBC: 6.9
WBC: 7

## 2011-04-18 LAB — BASIC METABOLIC PANEL
BUN: 34 — ABNORMAL HIGH
BUN: 37 — ABNORMAL HIGH
BUN: 52 — ABNORMAL HIGH
CO2: 26
CO2: 26
Calcium: 8.7
Calcium: 8.8
Chloride: 100
Chloride: 101
Creatinine, Ser: 2.56 — ABNORMAL HIGH
Creatinine, Ser: 2.6 — ABNORMAL HIGH
GFR calc Af Amer: 29 — ABNORMAL LOW
GFR calc non Af Amer: 20 — ABNORMAL LOW
Glucose, Bld: 170 — ABNORMAL HIGH
Potassium: 4.3

## 2011-04-18 LAB — DIFFERENTIAL
Basophils Absolute: 0
Eosinophils Absolute: 0.1
Eosinophils Relative: 2
Lymphs Abs: 0.6 — ABNORMAL LOW
Monocytes Absolute: 0.8
Monocytes Relative: 12
Neutro Abs: 5.4

## 2011-04-18 LAB — CK TOTAL AND CKMB (NOT AT ARMC)
CK, MB: 3.3
Relative Index: 1.8

## 2011-04-18 LAB — CULTURE, BLOOD (ROUTINE X 2): Culture: NO GROWTH

## 2011-04-18 LAB — SYNOVIAL CELL COUNT + DIFF, W/ CRYSTALS: Eosinophils-Synovial: 0

## 2011-04-18 LAB — BODY FLUID CULTURE: Culture: NO GROWTH

## 2011-05-06 LAB — BASIC METABOLIC PANEL
BUN: 31 — ABNORMAL HIGH
BUN: 31 — ABNORMAL HIGH
BUN: 36 — ABNORMAL HIGH
CO2: 23
CO2: 29
Calcium: 8.3 — ABNORMAL LOW
Calcium: 8.5
Calcium: 9
Chloride: 104
Chloride: 106
Creatinine, Ser: 2.32 — ABNORMAL HIGH
Creatinine, Ser: 2.5 — ABNORMAL HIGH
GFR calc Af Amer: 34 — ABNORMAL LOW
GFR calc Af Amer: 35 — ABNORMAL LOW
GFR calc Af Amer: 30 — ABNORMAL LOW
GFR calc non Af Amer: 27 — ABNORMAL LOW
GFR calc non Af Amer: 27 — ABNORMAL LOW
Glucose, Bld: 82
Potassium: 3.9
Potassium: 4.2
Potassium: 4.2
Sodium: 132 — ABNORMAL LOW
Sodium: 135

## 2011-05-06 LAB — LIPID PANEL
HDL: 43
LDL Cholesterol: 65
Total CHOL/HDL Ratio: 2.6
Triglycerides: 24
VLDL: 5

## 2011-05-06 LAB — CARDIAC PANEL(CRET KIN+CKTOT+MB+TROPI)
Relative Index: 2.9 — ABNORMAL HIGH
Total CK: 260 — ABNORMAL HIGH
Total CK: 344 — ABNORMAL HIGH

## 2011-05-06 LAB — COMPREHENSIVE METABOLIC PANEL
ALT: 17
AST: 28
Albumin: 3.3 — ABNORMAL LOW
Alkaline Phosphatase: 81
CO2: 27
Chloride: 93 — ABNORMAL LOW
Creatinine, Ser: 2.4 — ABNORMAL HIGH
GFR calc Af Amer: 32 — ABNORMAL LOW
GFR calc non Af Amer: 26 — ABNORMAL LOW
Potassium: 4.5
Sodium: 129 — ABNORMAL LOW
Total Bilirubin: 0.8

## 2011-05-06 LAB — CBC
HCT: 30.9 — ABNORMAL LOW
HCT: 31.3 — ABNORMAL LOW
Hemoglobin: 10.1 — ABNORMAL LOW
MCHC: 34.6
MCHC: 34.6
MCV: 83.7
MCV: 84.6
MCV: 85.2
MCV: 85.2
Platelets: 157
Platelets: 158
Platelets: 191
RBC: 3.46 — ABNORMAL LOW
RBC: 3.62 — ABNORMAL LOW
RBC: 4.31
RDW: 14.3 — ABNORMAL HIGH
WBC: 4.1
WBC: 4.1
WBC: 4.1
WBC: 4.2

## 2011-05-06 LAB — PROTIME-INR: INR: 1.1

## 2011-05-06 LAB — CK TOTAL AND CKMB (NOT AT ARMC): Relative Index: 2.4

## 2011-05-06 LAB — APTT: aPTT: 33

## 2011-05-08 ENCOUNTER — Other Ambulatory Visit: Payer: Self-pay

## 2011-05-08 DIAGNOSIS — Z01810 Encounter for preprocedural cardiovascular examination: Secondary | ICD-10-CM

## 2011-05-08 DIAGNOSIS — N189 Chronic kidney disease, unspecified: Secondary | ICD-10-CM

## 2011-05-24 ENCOUNTER — Encounter: Payer: Self-pay | Admitting: Vascular Surgery

## 2011-05-30 ENCOUNTER — Encounter: Payer: Self-pay | Admitting: Vascular Surgery

## 2011-05-31 ENCOUNTER — Encounter: Payer: Self-pay | Admitting: Vascular Surgery

## 2011-05-31 ENCOUNTER — Ambulatory Visit (INDEPENDENT_AMBULATORY_CARE_PROVIDER_SITE_OTHER): Payer: Medicare Other | Admitting: Vascular Surgery

## 2011-05-31 ENCOUNTER — Other Ambulatory Visit: Payer: Self-pay | Admitting: *Deleted

## 2011-05-31 VITALS — BP 185/99 | HR 75 | Temp 98.2°F | Ht 74.0 in | Wt 179.0 lb

## 2011-05-31 DIAGNOSIS — N186 End stage renal disease: Secondary | ICD-10-CM

## 2011-05-31 DIAGNOSIS — Z01810 Encounter for preprocedural cardiovascular examination: Secondary | ICD-10-CM

## 2011-05-31 NOTE — Progress Notes (Signed)
[facility] [place]  Referred by:  Zada Girt, MD [address] [facility] [place], [place] [address]  Reason for referral: New access  History of Present Illness  [name] is a 77 y.o. male who presents for evaluation for permanent access.  The patient is right hand dominant.  The patient has not had previous access procedures.  Previous central venous cannulation procedures include: RIJV TDC.  The patient has not had a PPM placed. The patient has right leg sciatica and sleeps on his left side, so he is requesting right arm access placement despite his dominant arm.  Past Medical History  Diagnosis Date  . Chronic kidney disease   . Hypertension   . CAD (coronary artery disease)   . Hyperlipidemia   . Anemia   . Thyroid disease   . Gout   . Arthritis   . Anxiety   . Anemia in chronic kidney disease   . Wears hearing aid   . Myocardial infarction     X 4  . Neuromuscular disorder     sciatica right leg   Past Surgical History  Procedure Date  . Nose surgery     cauterized for numerous bleeds  . Kidney surgery   . Coronary angioplasty with stent placement   . Eye surgery     right cataract surgery   History   Social History  . Marital Status: Married    Spouse Name: N/A    Number of Children: N/A  . Years of Education: N/A   Occupational History  . Not on file.   Social History Main Topics  . Smoking status: Former Smoker    Quit date: 05/31/1963  . Smokeless tobacco: Not on file  . Alcohol Use: No  . Drug Use: No  . Sexually Active: Not on file   Other Topics Concern  . Not on file   Social History Narrative  . No narrative on file   Family History  Problem Relation Age of Onset  . Heart disease Mother   . Hypertension Mother   . Heart disease Father   . COPD Father   . Hypertension Sister   . Heart disease Sister     Current Outpatient Prescriptions on File Prior to Visit  Medication Sig Dispense  Refill  . amLODipine (NORVASC) 10 MG tablet Take 10 mg by mouth daily.        Marland Kitchen aspirin 81 MG tablet Take 81 mg by mouth daily.        . clopidogrel (PLAVIX) 75 MG tablet Take 75 mg by mouth daily.        . ferrous gluconate (FERGON) 325 MG tablet Take 325 mg by mouth daily with breakfast.        . furosemide (LASIX) 40 MG tablet Take 40 mg by mouth 2 (two) times daily.        . isosorbide mononitrate (IMDUR) 60 MG 24 hr tablet Take 60 mg by mouth daily.        Marland Kitchen lisinopril (PRINIVIL,ZESTRIL) 2.5 MG tablet Take 2.5 mg by mouth daily.        Marland Kitchen lovastatin (MEVACOR) 20 MG tablet Take 20 mg by mouth at bedtime.        . montelukast (SINGULAIR) 10 MG tablet Take 10 mg by mouth at bedtime.        . nitroGLYCERIN (NITROSTAT) 0.4 MG SL tablet Place 0.4 mg under the tongue every 5 (five) minutes as needed.        Marland Kitchen  traMADol (ULTRAM) 50 MG tablet Take 50 mg by mouth every 6 (six) hours as needed. Maximum dose= 8 tablets per day       . ALPRAZolam (XANAX) 0.25 MG tablet Take 0.25 mg by mouth at bedtime as needed.          Allergies  Allergen Reactions  . Tarka (Trandolapril-Verapamil Hcl) Cough    REVIEW OF SYSTEMS:  (Positives indicated with an "x", otherwise negative)  CARDIOVASCULAR: [ ]  chest pain    [ ]  chest pressure    [x]  palpitations    [ ]  orthopnea   [ ]  dyspnea on exert. [ ]  claudication    [ ]  rest pain     [ ]  DVT     [ ]  phlebitis  PULMONARY:    [ ]  productive cough [ ]  asthma  [ ]  wheezing  NEUROLOGIC:    [ ]  weakness    [ ]  paresthesias   [ ]  aphasia    [ ]  amaurosis    [ ]  dizziness  HEMATOLOGIC:    [ ]  bleeding problems  [ ]  clotting disorders  MUSCULOSKEL: [x]  pain in legs with walking    [x]  leg swelling  GASTROINTEST:  [ ]   blood in stool   [ ]   hematemesis  GENITOURINARY:   [ ]   dysuria    [ ]   hematuria  PSYCHIATRIC:   [ ]  history of major depression  INTEGUMENTARY: [ ]  rashes    [ ]  ulcers  CONSTITUTIONAL:  [ ]  fever     [ ]  chills  Physical  Examination  Filed Vitals:   05/31/11 1510  BP: 185/99  Pulse: 75  Temp: 98.2 F (36.8 C)  TempSrc: Oral  Height: 6\' 2"  (1.88 m)  Weight: 179 lb (81.194 kg)  SpO2: 100%   Body mass index is 22.98 kg/(m^2).  General: A&O x 3, WDWN  Head: [place]/AT  Ear/Nose/Throat: Hearing grossly intact, nares w/o erythema or drainage, oropharynx w/o Erythema/Exudate  Eyes: PERRLA, EOMI  Neck: Supple, no nuchal rigidity, no palpable LAD  Pulmonary: Sym exp, good air movt, CTAB, no rales, rhonchi, & wheezing  Cardiac: RRR, Nl S1, S2, no Murmurs, rubs or gallops  Vascular: Vessel Right Left  Radial Palpable Palpable  Brachial Palpable Palpable  Carotid Palpable, without bruit Palpable, without bruit  Aorta Non-palpable N/A  Femoral Palpable Palpable  Popliteal Non-palpable Non-palpable  PT Palpable Palpable  DP Palpable Palpable   Gastrointestinal: soft, NTND, -G/R, - HSM, - masses, - CVAT B  Musculoskeletal: M/S 5/5 throughout , Extremities without ischemic changes   Neurologic: CN 2-12 intact , Pain and light touch intact in extremities , Motor exam as listed above  Psychiatric: Judgment intact, Mood & affect appropriate for pt's clinical situation  Dermatologic: See M/S exam for extremity exam, no rashes otherwise noted  Lymph : No Cervical, Axillary, or Inguinal lymphadenopathy   Non-Invasive Vascular Imaging  Vein Mapping  (Date: 05/31/11):   R arm: acceptable vein conduits include entire cephalic and basilic veins  L arm: acceptable vein conduits include upper arm cephalic vein and basilic veins  Outside Studies/Documentation 5 pages of outside documents were reviewed including: nephrology clinic notes.  Medical Decision Making  [name] is a 77 y.o. male who presents with ESRD requiring hemodialysis.   Based on vein mapping and examination, this patient's permanent access options include: R RC vs BC AVF, R BVT, L BC AVF, L BVT. Patient prefers R RC vs BC  AVF  I had an extensive discussion with this patient in regards to the nature of access surgery, including risk, benefits, and alternatives.    The patient is aware that the risks of access surgery include but are not limited to: bleeding, infection, steal syndrome, nerve damage, ischemic monomelic neuropathy, failure of access to mature, and possible need for additional access procedures in the future.  The patient has agreed to proceed with the above procedure which will be scheduled 20 NOV 12.  Leonides Sake, MD [facility] [place] Office: [phone] Pager: [phone]  05/31/2011, 4:05 PM

## 2011-06-05 ENCOUNTER — Telehealth: Payer: Self-pay | Admitting: *Deleted

## 2011-06-05 NOTE — Telephone Encounter (Signed)
Debbie called from [facility] to say Mrs Fitzwater wanted to cancel Otho's surgery. They are going to [facility] for a second opinion

## 2011-06-06 ENCOUNTER — Ambulatory Visit
Admission: RE | Admit: 2011-06-06 | Discharge: 2011-06-06 | Disposition: A | Discharge status: Home / Self Care | Payer: Medicare Other | Source: Ambulatory Visit | Attending: Cardiovascular Disease | Admitting: Cardiovascular Disease

## 2011-06-06 ENCOUNTER — Other Ambulatory Visit: Payer: Self-pay | Admitting: Cardiovascular Disease

## 2011-06-06 DIAGNOSIS — R918 Other nonspecific abnormal finding of lung field: Secondary | ICD-10-CM

## 2011-06-06 NOTE — Procedures (Unsigned)
CEPHALIC VEIN MAPPING  INDICATION:  End-stage renal disease.  HISTORY: CAD, stent, hyperlipidemia, hypertension, end-stage renal disease.  EXAM: The right cephalic vein is compressible.  Diameter measurements range from 0.35 to 0.42 cm.  The right basilic vein is compressible.  Diameter measurements range from 0.44 to 1.03 cm.  The left cephalic vein is not compressible.  Diameter measurements range from 0.17 to 0.41 cm.  The left basilic vein is compressible.  Diameter measurements range from 0.34 to 1.02 cm.  See attached worksheet for all measurements.  IMPRESSION: 1. Patent bilateral basilic veins with diameter measurements as     described above. 2. Left cephalic vein is noncompressible at the mid forearm to wrist     segment. 3. Right cephalic vein is patent and compressible.  All diameter     measurements are as noted above and on worksheet.  ___________________________________________ Fransisco Hertz, MD  SH/MEDQ  D:  05/31/2011  T:  05/31/2011  Job:  [id number]

## 2011-06-11 ENCOUNTER — Encounter (HOSPITAL_COMMUNITY): Admission: RE | Payer: Self-pay | Source: Ambulatory Visit

## 2011-06-11 ENCOUNTER — Ambulatory Visit (HOSPITAL_COMMUNITY): Admission: RE | Admit: 2011-06-11 | Payer: Medicare Other | Source: Ambulatory Visit | Admitting: Vascular Surgery

## 2011-06-11 SURGERY — ARTERIOVENOUS (AV) FISTULA CREATION
Anesthesia: Monitor Anesthesia Care | Site: Arm Lower | Laterality: Right

## 2011-06-21 ENCOUNTER — Encounter: Payer: Self-pay | Admitting: Surgery

## 2011-06-24 ENCOUNTER — Ambulatory Visit (INDEPENDENT_AMBULATORY_CARE_PROVIDER_SITE_OTHER): Payer: Medicare Other | Admitting: Surgery

## 2011-06-24 ENCOUNTER — Encounter: Payer: Self-pay | Admitting: Surgery

## 2011-06-24 ENCOUNTER — Other Ambulatory Visit (INDEPENDENT_AMBULATORY_CARE_PROVIDER_SITE_OTHER): Payer: Medicare Other

## 2011-06-24 VITALS — BP 169/95 | HR 81 | Resp 16 | Ht 74.0 in | Wt 179.0 lb

## 2011-06-24 DIAGNOSIS — Z01810 Encounter for preprocedural cardiovascular examination: Secondary | ICD-10-CM

## 2011-06-24 DIAGNOSIS — N184 Chronic kidney disease, stage 4 (severe): Secondary | ICD-10-CM

## 2011-06-24 DIAGNOSIS — N186 End stage renal disease: Secondary | ICD-10-CM

## 2011-06-24 NOTE — Progress Notes (Signed)
[facility] [place]   Patient name: [name] MRN: [id number] DOB: 05/24/1926 Sex: male     Chief Complaint  Patient presents with  . ESRD    follow up to discuss AVF    HISTORY OF PRESENT ILLNESS: The patient is in today for a second opinion regarding permanent dialysis access. He has previously seen Dr. Chen. In addition he was evaluated in [place] and felt to have 2 small of vein for fistula creation he currently dialyzes through a catheter which is in his right internal jugular vein. The patient is right-handed. His renal failure secondary to hypertension. He dialyzes on Monday Wednesday Friday. He takes Plavix for his coronary artery disease.  Past Medical History  Diagnosis Date  . Chronic kidney disease   . Hypertension   . CAD (coronary artery disease)   . Hyperlipidemia   . Anemia   . Thyroid disease   . Gout   . Arthritis   . Anxiety   . Anemia in chronic kidney disease   . Wears hearing aid   . Myocardial infarction     X 4  . Neuromuscular disorder     sciatica right leg    Past Surgical History  Procedure Date  . Nose surgery     cauterized for numerous bleeds  . Kidney surgery   . Coronary angioplasty with stent placement   . Eye surgery     right cataract surgery    History   Social History  . Marital Status: Married    Spouse Name: N/A    Number of Children: N/A  . Years of Education: N/A   Occupational History  . Not on file.   Social History Main Topics  . Smoking status: Former Smoker    Quit date: 05/31/1963  . Smokeless tobacco: Not on file  . Alcohol Use: No  . Drug Use: No  . Sexually Active: Not on file   Other Topics Concern  . Not on file   Social History Narrative  . No narrative on file    Family History  Problem Relation Age of Onset  . Heart disease Mother   . Hypertension Mother   . Heart disease Father   . COPD Father   . Hypertension Sister   . Heart disease Sister      Allergies as of 06/24/2011 - Review Complete 06/24/2011  Allergen Reaction Noted  . Tarka (trandolapril-verapamil hcl) Cough 05/24/2011    Current Outpatient Prescriptions on File Prior to Visit  Medication Sig Dispense Refill  . amLODipine (NORVASC) 10 MG tablet Take 10 mg by mouth daily.        . aspirin 81 MG tablet Take 81 mg by mouth daily.        . clopidogrel (PLAVIX) 75 MG tablet Take 75 mg by mouth daily.        . furosemide (LASIX) 40 MG tablet Take 40 mg by mouth 2 (two) times daily.        . isosorbide mononitrate (IMDUR) 60 MG 24 hr tablet Take 60 mg by mouth daily.        . lisinopril (PRINIVIL,ZESTRIL) 2.5 MG tablet Take 2.5 mg by mouth daily.        . lovastatin (MEVACOR) 20 MG tablet Take 20 mg by mouth at bedtime.        . montelukast (SINGULAIR) 10 MG tablet Take 10 mg by mouth at bedtime.        . nitroGLYCERIN (  NITROSTAT) 0.4 MG SL tablet Place 0.4 mg under the tongue every 5 (five) minutes as needed.        . traMADol (ULTRAM) 50 MG tablet Take 50 mg by mouth every 6 (six) hours as needed. Maximum dose= 8 tablets per day       . ALPRAZolam (XANAX) 0.25 MG tablet Take 0.25 mg by mouth at bedtime as needed.        . ferrous gluconate (FERGON) 325 MG tablet Take 325 mg by mouth daily with breakfast.           REVIEW OF SYSTEMS: No chest pain or shortness of breath positive for palpitations and leg swelling all other systems negative PHYSICAL EXAMINATION:   Vital signs are BP 169/95  Pulse 81  Resp 16  Ht 6' 2" (1.88 m)  Wt 179 lb (81.194 kg)  BMI 22.98 kg/m2  SpO2 99% General: The patient appears their stated age. HEENT:  No gross abnormalities Pulmonary:  Non labored breathing Abdomen: Soft and non-tender Musculoskeletal: There are no major deformities. Neurologic: No focal weakness or paresthesias are detected, Skin: There are no ulcer or rashes noted. Psychiatric: The patient has normal affect. Cardiovascular: There is a regular rate and rhythm  without significant murmur appreciated. Palpable left radial pulse   Diagnostic Studies I personally easy ultrasound to evaluate his cephalic and basilic vein. I was not impressed with his veins and therefore sent him back for a formal duplex today we obtained good results the weighted 3 weeks ago. Today he had thickened walls on all of his veins none were of adequate caliber for fistula  Assessment: Renal failure Plan: After reviewing the vein studies today with the patient and his family I feel he is best suited for a forearm graft. I've recommended proceeding with a left forearm Gore-Tex graft to be performed Thursday, December 20. He'll be off his Plavix 5 days prior to his operation. I discussed the risks and benefits of the procedure with the patient including the risk of infection the risk of needing recurrent operations in the risk of steal syndrome. He understands all these and wishes to proceed. I did tell him I would evaluate his cephalic and basilic vein in the operating room again just to make sure that he is not a fistula candidate. All his questions were answered today  [name] IV, M.D. [facility] [place] Office: [phone] Pager:  [phone]    

## 2011-06-27 ENCOUNTER — Other Ambulatory Visit: Payer: Self-pay | Admitting: *Deleted

## 2011-06-28 ENCOUNTER — Ambulatory Visit: Payer: Medicare Other | Admitting: Vascular Surgery

## 2011-07-04 ENCOUNTER — Inpatient Hospital Stay (HOSPITAL_COMMUNITY): Admission: RE | Admit: 2011-07-04 | Discharge: 2011-07-04 | Payer: Medicare Other | Source: Ambulatory Visit

## 2011-07-04 NOTE — Progress Notes (Signed)
Hemodialysis MWF

## 2011-07-08 ENCOUNTER — Other Ambulatory Visit: Payer: Self-pay

## 2011-07-08 DIAGNOSIS — N19 Unspecified kidney failure: Secondary | ICD-10-CM

## 2011-07-08 DIAGNOSIS — Z01810 Encounter for preprocedural cardiovascular examination: Secondary | ICD-10-CM

## 2011-07-08 NOTE — Procedures (Unsigned)
CEPHALIC VEIN MAPPING  INDICATION:  Patient on hemodialysis; evaluate for permanent access.  HISTORY: Previous exams performed in [place] have revealed small diameter cephalic and basilic veins.  EXAM: The right cephalic vein is compressible.  Diameter measurements range from 0.15 to 0.29 cm.  The right basilic vein is compressible in the proximal to mid upper arm.  Diameter measurements range from 0.28 to 0.29.  The majority of the left cephalic vein is compressible.  There is chronic thrombus noted at the antecubital fossa.  Diameter measurements range from 0.08 to 0.14 cm.  The left basilic vein is compressible, although walls are  thickened.  Diameter measurements range from 0.14 to 0.29.  See attached worksheet for all measurements.  IMPRESSION: 1. Patent bilateral cephalic and basilic veins with diameter     measurements as described above.  Chronic thrombus and thickened     walls as noted above. 2. Incidental finding of high take-off of the radial artery in the     proximal upper arm bilaterally.  ___________________________________________ V. Charlena Cross, MD  CI/MEDQ  D:  06/24/2011  T:  06/24/2011  Job:  [id number]

## 2011-07-09 ENCOUNTER — Encounter (HOSPITAL_COMMUNITY): Payer: Self-pay | Admitting: *Deleted

## 2011-07-10 MED ORDER — SODIUM CHLORIDE 0.9 % IV SOLN
INTRAVENOUS | Status: DC
  Administered 2011-07-11: 11:00:00 via INTRAVENOUS

## 2011-07-10 MED ORDER — CEFUROXIME SODIUM 1.5 G IJ SOLR
1.5000 g | Freq: Once | INTRAMUSCULAR | Status: AC
  Administered 2011-07-11: 1.5 g via INTRAVENOUS
  Filled 2011-07-10: qty 1.5

## 2011-07-11 ENCOUNTER — Encounter (HOSPITAL_COMMUNITY): Payer: Self-pay | Admitting: *Deleted

## 2011-07-11 ENCOUNTER — Encounter (HOSPITAL_COMMUNITY): Payer: Self-pay | Admitting: Anesthesiology

## 2011-07-11 ENCOUNTER — Other Ambulatory Visit: Payer: Self-pay

## 2011-07-11 ENCOUNTER — Ambulatory Visit (HOSPITAL_COMMUNITY)
Admission: RE | Admit: 2011-07-11 | Discharge: 2011-07-11 | Disposition: A | Discharge status: Home / Self Care | Payer: Medicare Other | Source: Ambulatory Visit | Attending: Surgery | Admitting: Surgery

## 2011-07-11 ENCOUNTER — Encounter (HOSPITAL_COMMUNITY)
Admission: RE | Disposition: A | Discharge status: Home / Self Care | Payer: Self-pay | Source: Ambulatory Visit | Attending: Surgery

## 2011-07-11 ENCOUNTER — Ambulatory Visit (HOSPITAL_COMMUNITY): Payer: Medicare Other | Admitting: Anesthesiology

## 2011-07-11 DIAGNOSIS — I2510 Atherosclerotic heart disease of native coronary artery without angina pectoris: Secondary | ICD-10-CM | POA: Insufficient documentation

## 2011-07-11 DIAGNOSIS — I252 Old myocardial infarction: Secondary | ICD-10-CM | POA: Insufficient documentation

## 2011-07-11 DIAGNOSIS — N186 End stage renal disease: Secondary | ICD-10-CM

## 2011-07-11 DIAGNOSIS — I209 Angina pectoris, unspecified: Secondary | ICD-10-CM | POA: Insufficient documentation

## 2011-07-11 DIAGNOSIS — I120 Hypertensive chronic kidney disease with stage 5 chronic kidney disease or end stage renal disease: Secondary | ICD-10-CM | POA: Insufficient documentation

## 2011-07-11 HISTORY — PX: AV FISTULA PLACEMENT: SHX1204

## 2011-07-11 LAB — BASIC METABOLIC PANEL
CO2: 33 mEq/L — ABNORMAL HIGH (ref 19–32)
Glucose, Bld: 89 mg/dL (ref 70–99)
Potassium: 4.1 mEq/L (ref 3.5–5.1)
Sodium: 142 mEq/L (ref 135–145)

## 2011-07-11 LAB — DIFFERENTIAL
Eosinophils Absolute: 0.3 10*3/uL (ref 0.0–0.7)
Eosinophils Relative: 6 % — ABNORMAL HIGH (ref 0–5)
Lymphs Abs: 1.1 10*3/uL (ref 0.7–4.0)
Monocytes Absolute: 0.7 10*3/uL (ref 0.1–1.0)
Monocytes Relative: 14 % — ABNORMAL HIGH (ref 3–12)

## 2011-07-11 LAB — CBC
Hemoglobin: 9.5 g/dL — ABNORMAL LOW (ref 13.0–17.0)
MCH: 27.4 pg (ref 26.0–34.0)
MCV: 79.3 fL (ref 78.0–100.0)
RBC: 3.47 MIL/uL — ABNORMAL LOW (ref 4.22–5.81)

## 2011-07-11 SURGERY — ARTERIOVENOUS (AV) FISTULA CREATION
Anesthesia: Monitor Anesthesia Care | Site: Arm Lower | Laterality: Left | Wound class: Clean

## 2011-07-11 MED ORDER — MIDAZOLAM HCL 5 MG/5ML IJ SOLN
INTRAMUSCULAR | Status: DC | PRN
  Administered 2011-07-11: 0.5 mg via INTRAVENOUS

## 2011-07-11 MED ORDER — MUPIROCIN 2 % EX OINT
TOPICAL_OINTMENT | CUTANEOUS | Status: AC
  Filled 2011-07-11: qty 22

## 2011-07-11 MED ORDER — LIDOCAINE-EPINEPHRINE (PF) 1 %-1:200000 IJ SOLN
INTRAMUSCULAR | Status: DC | PRN
  Administered 2011-07-11: 6 mL via INTRADERMAL

## 2011-07-11 MED ORDER — DROPERIDOL 2.5 MG/ML IJ SOLN: 0.6250 mg | INTRAMUSCULAR | Status: DC | PRN

## 2011-07-11 MED ORDER — MUPIROCIN 2 % EX OINT
TOPICAL_OINTMENT | Freq: Two times a day (BID) | CUTANEOUS | Status: DC
  Filled 2011-07-11: qty 22

## 2011-07-11 MED ORDER — PROPOFOL 10 MG/ML IV EMUL
INTRAVENOUS | Status: DC | PRN
  Administered 2011-07-11: 75 ug/kg/min via INTRAVENOUS

## 2011-07-11 MED ORDER — HEPARIN SODIUM (PORCINE) 1000 UNIT/ML IJ SOLN
INTRAMUSCULAR | Status: DC | PRN
  Administered 2011-07-11: 4000 [IU] via INTRAVENOUS

## 2011-07-11 MED ORDER — HYDROMORPHONE HCL PF 1 MG/ML IJ SOLN: 0.2500 mg | INTRAMUSCULAR | Status: DC | PRN

## 2011-07-11 MED ORDER — FENTANYL CITRATE 0.05 MG/ML IJ SOLN
INTRAMUSCULAR | Status: DC | PRN
  Administered 2011-07-11: 50 ug via INTRAVENOUS
  Administered 2011-07-11: 100 ug via INTRAVENOUS

## 2011-07-11 MED ORDER — SODIUM CHLORIDE 0.9 % IR SOLN
Status: DC | PRN
  Administered 2011-07-11: 14:00:00

## 2011-07-11 MED ORDER — OXYCODONE-ACETAMINOPHEN 5-325 MG PO TABS: 1.0000 | ORAL_TABLET | ORAL | Status: AC | PRN

## 2011-07-11 MED ORDER — SODIUM CHLORIDE 0.9 % IR SOLN
Status: DC | PRN
  Administered 2011-07-11: 1000 mL

## 2011-07-11 SURGICAL SUPPLY — 43 items
ADH SKN CLS APL DERMABOND .7 (GAUZE/BANDAGES/DRESSINGS) ×1
ADH SKN CLS LQ APL DERMABOND (GAUZE/BANDAGES/DRESSINGS) ×1
CANISTER SUCTION 2500CC (MISCELLANEOUS) ×2 IMPLANT
CLIP TI MEDIUM 6 (CLIP) ×2 IMPLANT
CLIP TI WIDE RED SMALL 6 (CLIP) ×2 IMPLANT
CLOTH BEACON ORANGE TIMEOUT ST (SAFETY) ×2 IMPLANT
COVER PROBE W GEL 5X96 (DRAPES) ×2 IMPLANT
COVER SURGICAL LIGHT HANDLE (MISCELLANEOUS) ×4 IMPLANT
DERMABOND ADHESIVE PROPEN (GAUZE/BANDAGES/DRESSINGS) ×1
DERMABOND ADVANCED (GAUZE/BANDAGES/DRESSINGS) ×1
DERMABOND ADVANCED .7 DNX12 (GAUZE/BANDAGES/DRESSINGS) ×1 IMPLANT
DERMABOND ADVANCED .7 DNX6 (GAUZE/BANDAGES/DRESSINGS) IMPLANT
ELECT REM PT RETURN 9FT ADLT (ELECTROSURGICAL) ×2
ELECTRODE REM PT RTRN 9FT ADLT (ELECTROSURGICAL) ×1 IMPLANT
GLOVE BIOGEL PI IND STRL 6.5 (GLOVE) IMPLANT
GLOVE BIOGEL PI IND STRL 7.0 (GLOVE) IMPLANT
GLOVE BIOGEL PI IND STRL 7.5 (GLOVE) ×1 IMPLANT
GLOVE BIOGEL PI INDICATOR 6.5 (GLOVE) ×2
GLOVE BIOGEL PI INDICATOR 7.0 (GLOVE) ×2
GLOVE BIOGEL PI INDICATOR 7.5 (GLOVE) ×1
GLOVE ECLIPSE 6.0 STRL STRAW (GLOVE) ×2 IMPLANT
GLOVE ECLIPSE 6.5 STRL STRAW (GLOVE) ×1 IMPLANT
GLOVE EXAM NITRILE MD LF STRL (GLOVE) ×1 IMPLANT
GLOVE SURG SS PI 7.5 STRL IVOR (GLOVE) ×2 IMPLANT
GOWN PREVENTION PLUS XXLARGE (GOWN DISPOSABLE) ×3 IMPLANT
GOWN STRL NON-REIN LRG LVL3 (GOWN DISPOSABLE) ×4 IMPLANT
HEMOSTAT SNOW SURGICEL 2X4 (HEMOSTASIS) IMPLANT
HEMOSTAT SURGICEL 2X14 (HEMOSTASIS) IMPLANT
KIT BASIN OR (CUSTOM PROCEDURE TRAY) ×2 IMPLANT
KIT ROOM TURNOVER OR (KITS) ×2 IMPLANT
MARKER SKIN DUAL TIP RULER LAB (MISCELLANEOUS) ×1 IMPLANT
NS IRRIG 1000ML POUR BTL (IV SOLUTION) ×2 IMPLANT
PACK CV ACCESS (CUSTOM PROCEDURE TRAY) ×2 IMPLANT
PAD ARMBOARD 7.5X6 YLW CONV (MISCELLANEOUS) ×4 IMPLANT
SUT PROLENE 6 0 CC (SUTURE) ×4 IMPLANT
SUT VIC AB 3-0 SH 27 (SUTURE) ×2
SUT VIC AB 3-0 SH 27X BRD (SUTURE) ×1 IMPLANT
SUT VICRYL 4-0 PS2 18IN ABS (SUTURE) IMPLANT
SYR 20CC LL (SYRINGE) ×1 IMPLANT
TOWEL OR 17X24 6PK STRL BLUE (TOWEL DISPOSABLE) ×2 IMPLANT
TOWEL OR 17X26 10 PK STRL BLUE (TOWEL DISPOSABLE) ×2 IMPLANT
UNDERPAD 30X30 INCONTINENT (UNDERPADS AND DIAPERS) ×2 IMPLANT
WATER STERILE IRR 1000ML POUR (IV SOLUTION) ×2 IMPLANT

## 2011-07-11 NOTE — Anesthesia Postprocedure Evaluation (Signed)
Anesthesia Post Note  Patient: [name]  Procedure(s) Performed:  ARTERIOVENOUS (AV) FISTULA CREATION  Anesthesia type: MAC  Patient location: PACU  Post pain: Pain level controlled  Post assessment: Patient's Cardiovascular Status Stable  Last Vitals:  Filed Vitals:   07/11/11 1500  BP:   Pulse: 52  Temp: 36.1 C  Resp: 14    Post vital signs: Reviewed and stable  Level of consciousness: sedated  Complications: No apparent anesthesia complications

## 2011-07-11 NOTE — Interval H&P Note (Signed)
History and Physical Interval Note:  07/11/2011 10:29 AM  [name]  has presented today for surgery, with the diagnosis of End Stage Renal Disease  The various methods of treatment have been discussed with the patient and family. After consideration of risks, benefits and other options for treatment, the patient has consented to  Procedure(s): ARTERIOVENOUS (AV) FISTULA CREATION as a surgical intervention .  The patients' history has been reviewed, patient examined, no change in status, stable for surgery.  I have reviewed the patients' chart and labs.  Questions were answered to the patient's satisfaction.     BRABHAM IV, V. WELLS

## 2011-07-11 NOTE — Op Note (Signed)
[facility] [place]  Patient name: [name] MRN: [id number] DOB: [birth year] Sex: male  07/11/2011 Pre-operative Diagnosis: End stage renal disease Post-operative diagnosis:  Same Surgeon:  Jorge Ny Assistants:  Zenaida Niece Procedure:   Left upper arm AV fistula Anesthesia:  MAC Blood Loss:  See anesthesia record Specimens:  None  Findings:  Vein and dilated using a 4 dilator  Indications:  The patient comes in today for access creation. He has had 3 vein maps. 2 of them showed him to have an adequate veins. He was consented for a left forearm graft. I discussed the possibility of a fistula based on what his veins look like intraoperatively  Procedure:  The patient was identified in the holding area and taken to [facility] OR ROOM 09  The patient was then placed supine on the table. IV sedation anesthesia was administered.  The patient was prepped and draped in the usual sterile fashion.  A time out was called and antibiotics were administered.  The patient anesthetized he had very prominent superficial veins. I used the ultrasound to evaluate his cephalic vein. It did appear to be adequate. It was certainly larger than what it appeared in the office. I used 1% lidocaine for local anesthesia. A transverse incision was made in the antecubital crease. Sharp dissection was used to dissect out the cephalic vein. I mobilized it proximally and distally. It appeared to be about a 3 mm vein. I then dissected out the artery the artery was approximately 3 mm. I used an 8 pen to mark the vein for proper orientation I placed a right angle distally and transected the vein. The distal limb was tied off with a 3-0 silk tie. I then instilled heparinized saline into the vein and it distended nicely. I dilated the vein with sequential dilators up to a #4 dilator. At this point the patient was given 4000 units of heparin. After the heparin circulated the brachial artery was occluded  with Serafin clamps. A #11 blade was used to make an arteriotomy. This was extended longitudinally with Potts scissors. The vein was then spatulated to fit the size of the arteriotomy and a running anastomosis was created with 6-0 Prolene. Prior to completion the appropriate flushing maneuvers were performed. The anastomosis was completed. The patient continued to have a palpable radial pulse. The fistula was palpable up at the level of the shoulder. It also had excellent Doppler signal. There was hemostatic therefore I did not reverse the heparin. The deep tissue was closed with 3-0 Vicryl the skin was closed with 3-0 Vicryl Dermabond placed on the wound.   Disposition:  To PACU in stable condition.   Juleen China, M.D. [facility] [place] Office: [phone] Pager:  [phone]

## 2011-07-11 NOTE — Anesthesia Preprocedure Evaluation (Addendum)
Anesthesia Evaluation  Patient identified by MRN, date of birth, ID band  Reviewed: Allergy & Precautions, H&P , NPO status , Patient's Chart, lab work & pertinent test results  Airway Mallampati: I TM Distance: >3 FB   Mouth opening: Limited Mouth Opening  Dental  (+) Edentulous Upper and Teeth Intact   Pulmonary  clear to auscultation  Pulmonary exam normal       Cardiovascular hypertension, Pt. on medications + angina with exertion + CAD and + Past MI Regular Normal- Systolic murmurs    Neuro/Psych Anxiety    GI/Hepatic negative GI ROS, Neg liver ROS,   Endo/Other  Negative Endocrine ROS  Renal/GU CRFRenal disease     Musculoskeletal   Abdominal   Peds  Hematology   Anesthesia Other Findings   Reproductive/Obstetrics                         Anesthesia Physical Anesthesia Plan  ASA: III  Anesthesia Plan: MAC and General   Post-op Pain Management:    Induction: Intravenous  Airway Management Planned: LMA and Simple Face Mask  Additional Equipment:   Intra-op Plan:   Post-operative Plan: Extubation in OR  Informed Consent: I have reviewed the patients History and Physical, chart, labs and discussed the procedure including the risks, benefits and alternatives for the proposed anesthesia with the patient or authorized representative who has indicated his/her understanding and acceptance.   Dental advisory given  Plan Discussed with: CRNA and Anesthesiologist  Anesthesia Plan Comments:         Anesthesia Quick Evaluation

## 2011-07-11 NOTE — Preoperative (Signed)
Beta Blockers   Reason not to administer Beta Blockers:Not Applicable 

## 2011-07-11 NOTE — H&P (View-Only) (Signed)
[facility]   Patient name: [name] MRN: [id number] DOB: 01/03/1926 Sex: male     Chief Complaint  Patient presents with  . ESRD    follow up to discuss AVF    HISTORY OF PRESENT ILLNESS: The patient is in today for a second opinion regarding permanent dialysis access. He has previously seen Dr. Imogene Burn. In addition he was evaluated in [place] and felt to have 2 small of vein for fistula creation he currently dialyzes through a catheter which is in his right internal jugular vein. The patient is right-handed. His renal failure secondary to hypertension. He dialyzes on Monday Wednesday Friday. He takes Plavix for his coronary artery disease.  Past Medical History  Diagnosis Date  . Chronic kidney disease   . Hypertension   . CAD (coronary artery disease)   . Hyperlipidemia   . Anemia   . Thyroid disease   . Gout   . Arthritis   . Anxiety   . Anemia in chronic kidney disease   . Wears hearing aid   . Myocardial infarction     X 4  . Neuromuscular disorder     sciatica right leg    Past Surgical History  Procedure Date  . Nose surgery     cauterized for numerous bleeds  . Kidney surgery   . Coronary angioplasty with stent placement   . Eye surgery     right cataract surgery    History   Social History  . Marital Status: Married    Spouse Name: N/A    Number of Children: N/A  . Years of Education: N/A   Occupational History  . Not on file.   Social History Main Topics  . Smoking status: Former Smoker    Quit date: 05/31/1963  . Smokeless tobacco: Not on file  . Alcohol Use: No  . Drug Use: No  . Sexually Active: Not on file   Other Topics Concern  . Not on file   Social History Narrative  . No narrative on file    Family History  Problem Relation Age of Onset  . Heart disease Mother   . Hypertension Mother   . Heart disease Father   . COPD Father   . Hypertension Sister   . Heart disease Sister      Allergies as of 06/24/2011 - Review Complete 06/24/2011  Allergen Reaction Noted  . Tarka (trandolapril-verapamil hcl) Cough 05/24/2011    Current Outpatient Prescriptions on File Prior to Visit  Medication Sig Dispense Refill  . amLODipine (NORVASC) 10 MG tablet Take 10 mg by mouth daily.        Marland Kitchen aspirin 81 MG tablet Take 81 mg by mouth daily.        . clopidogrel (PLAVIX) 75 MG tablet Take 75 mg by mouth daily.        . furosemide (LASIX) 40 MG tablet Take 40 mg by mouth 2 (two) times daily.        . isosorbide mononitrate (IMDUR) 60 MG 24 hr tablet Take 60 mg by mouth daily.        Marland Kitchen lisinopril (PRINIVIL,ZESTRIL) 2.5 MG tablet Take 2.5 mg by mouth daily.        Marland Kitchen lovastatin (MEVACOR) 20 MG tablet Take 20 mg by mouth at bedtime.        . montelukast (SINGULAIR) 10 MG tablet Take 10 mg by mouth at bedtime.        . nitroGLYCERIN (  NITROSTAT) 0.4 MG SL tablet Place 0.4 mg under the tongue every 5 (five) minutes as needed.        . traMADol (ULTRAM) 50 MG tablet Take 50 mg by mouth every 6 (six) hours as needed. Maximum dose= 8 tablets per day       . ALPRAZolam (XANAX) 0.25 MG tablet Take 0.25 mg by mouth at bedtime as needed.        . ferrous gluconate (FERGON) 325 MG tablet Take 325 mg by mouth daily with breakfast.           REVIEW OF SYSTEMS: No chest pain or shortness of breath positive for palpitations and leg swelling all other systems negative PHYSICAL EXAMINATION:   Vital signs are BP 169/95  Pulse 81  Resp 16  Ht 6\' 2"  (1.88 m)  Wt 179 lb (81.194 kg)  BMI 22.98 kg/m2  SpO2 99% General: The patient appears their stated age. HEENT:  No gross abnormalities Pulmonary:  Non labored breathing Abdomen: Soft and non-tender Musculoskeletal: There are no major deformities. Neurologic: No focal weakness or paresthesias are detected, Skin: There are no ulcer or rashes noted. Psychiatric: The patient has normal affect. Cardiovascular: There is a regular rate and rhythm  without significant murmur appreciated. Palpable left radial pulse   Diagnostic Studies I personally easy ultrasound to evaluate his cephalic and basilic vein. I was not impressed with his veins and therefore sent him back for a formal duplex today we obtained good results the weighted 3 weeks ago. Today he had thickened walls on all of his veins none were of adequate caliber for fistula  Assessment: Renal failure Plan: After reviewing the vein studies today with the patient and his family I feel he is best suited for a forearm graft. I've recommended proceeding with a left forearm Gore-Tex graft to be performed Thursday, December 20. He'll be off his Plavix 5 days prior to his operation. I discussed the risks and benefits of the procedure with the patient including the risk of infection the risk of needing recurrent operations in the risk of steal syndrome. He understands all these and wishes to proceed. I did tell him I would evaluate his cephalic and basilic vein in the operating room again just to make sure that he is not a fistula candidate. All his questions were answered today  V. Charlena Cross, M.D. [facility] Office: [phone] Pager:  [phone]

## 2011-07-11 NOTE — Transfer of Care (Signed)
Immediate Anesthesia Transfer of Care Note  Patient: [name]  Procedure(s) Performed:  ARTERIOVENOUS (AV) FISTULA CREATION  Patient Location: PACU  Anesthesia Type: MAC  Level of Consciousness: lethargic and responds to stimulation  Airway & Oxygen Therapy: Patient Spontanous Breathing and Patient connected to face mask oxygen  Post-op Assessment: Report given to PACU RN  Post vital signs: Reviewed and stable  Complications: No apparent anesthesia complications

## 2011-07-12 ENCOUNTER — Encounter (HOSPITAL_COMMUNITY): Payer: Self-pay | Admitting: Surgery

## 2011-07-26 ENCOUNTER — Encounter: Payer: Self-pay | Admitting: Surgery

## 2011-07-29 ENCOUNTER — Ambulatory Visit: Payer: Medicare Other | Admitting: Surgery

## 2011-08-08 ENCOUNTER — Other Ambulatory Visit: Payer: Self-pay | Admitting: Ophthalmology

## 2011-08-12 ENCOUNTER — Other Ambulatory Visit (HOSPITAL_COMMUNITY): Payer: Self-pay | Admitting: *Deleted

## 2011-08-12 ENCOUNTER — Other Ambulatory Visit: Payer: Self-pay | Admitting: Ophthalmology

## 2011-08-12 ENCOUNTER — Encounter (HOSPITAL_COMMUNITY): Payer: Self-pay | Admitting: *Deleted

## 2011-08-12 DIAGNOSIS — H2510 Age-related nuclear cataract, unspecified eye: Secondary | ICD-10-CM

## 2011-08-12 NOTE — H&P (Signed)
SUBJECTIVE:  Chief Complaint:  Blurry OS interferring with reading, TV watching, & other daily activities    OD 03-15-2010  phacoemulsification with intraocular lens implant  HISTORY OF PRESENT ILLNESS:  Onset: Years   Duration:  Years   Quality: Worse     Location:  Left   Intensity / Severity: Moderate   Context / When:  Happened Before    Mental Status: Oriented to Time, Person, Place   REVIEW OF SYSTEMS:   GEN: Denies weight changes, appetite changes, unusual weakness, bleeding, fever, chills, recent trauma or infections  HEENT: Denies vision changes, hearing changes, epistaxis, unusual sneezing, sore throat, swallowing difficulties, ear pain, or facial pain.  NECK: Denies neck pain swelling or stiffness.  LUNGS: Denies cough, dyspnea, orthopnea, or hemoptosis.  HEART: . Confirms HIGH BLOOD PRESSURE.  ABD: Denies abdomen pain, eructation, nausea, vomiting, hematemesis, diarrhea, constipation, hematochezia, melena, acholic stools, or flatulence.  GENT: hemodialysis 3 times a week  BJE: Denies arthralgia, joint stiffness, back pain, muscle cramps, or myalgia.  SKIN: Denies rashes, lesions, anhidrosis, bruising, or pruritus.  NEURO: Denies memory loss, disorientation, syncope, diplopia, dizziness, vertigo, clumsiness, paresthesias or cephalgia.      OBJECTIVE:   EYE EXAM:   VA:  OD [place] 20/25 OS [place] 20/200 PH: ni  Glasses: OD   OS   ADD    MR:  od - 1.25 sph 20/200 os +0.75 + 1.25 x 20/25   VF: Full to Confrontation Testing. ou  Ocular Adnexa: normal ou  Motility: ortho full  SLE Conjunctiva: Pinguecula. ou  Pupils:37mm ou reactive ou  Iris: brown ou  Cornea: arcus ou temporal suture (-) Seidel  Anterior Chamber: deep +1 cell & flare OD and IOL in good position  Lens: POSTERIOR CHAMBER Intra Ocular Lens OD            OS      3+ NUCLEAR SCLEROSIS, CATARACT SENILE #366.16.. & posterior subcapsular 366.14    VITREOUS: POSTERIOR VITREOUS  DETACHMENT #379.21.   TARGET Ta:  IOP:8 / 1 OD and OS  Time: 11:30 a.m.  Gonio:  Dilation: PHENYLEPHRINE 2.5%. TROPICAMIDE 1%.    Optic discs:60% od 60% os with drusen  macula: clear ou Retina&vessels: narrow arterioles w/difficult view      PHYSICAL  Blood Pressure:140/76  Pulse:68  Respiration:22  GENERAL: No acute distress  HEENT: MM's WNL, TM's normal, PERLA, Conjunctiva WNL, Fundi  As  note above  NECK: Full ROM, no adenopathy or masses, Thyroid WNL  LUNGS: Clear to auscultation, equal BS  HEART: RR&R, GR 11/IV murmur, gallop, or rub, not enlarged  ABD: Normal  GENT: deferred  BJE: normal  NEURO: CN II through XII intact  SKIN: normal  STUDIES:  A-Scan     ASSSSMEENT:  Nuclear sclerosis   ICD#366.16  /posterior subcapsular 366.14 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 KIDNEY PROBLEMS, 4. Hypertension (high blood pressure) #401.9. ,HEART ATTACTS 3,AND SEVERAL BLOCKAGES STINTS  5.Chronic Obstructive Pulmonary Disease #496. 66GOUT  PLAN:  A-Scan, today Cataract Sugery,Patient understand the benefits and risk of surgery and has agreed to proceed at this time with phacoemulsification with intraocular lens implant OS   Letter Dr. Algie Coffer (medical [facility])

## 2011-08-13 ENCOUNTER — Inpatient Hospital Stay (HOSPITAL_COMMUNITY): Admission: RE | Admit: 2011-08-13 | Payer: Medicare Other | Source: Ambulatory Visit

## 2011-08-14 ENCOUNTER — Encounter (HOSPITAL_COMMUNITY): Payer: Self-pay | Admitting: Certified Registered"

## 2011-08-14 ENCOUNTER — Ambulatory Visit (HOSPITAL_COMMUNITY): Payer: Medicare Other | Admitting: Certified Registered"

## 2011-08-14 ENCOUNTER — Ambulatory Visit (HOSPITAL_COMMUNITY)
Admission: RE | Admit: 2011-08-14 | Discharge: 2011-08-14 | Disposition: A | Discharge status: Home / Self Care | Payer: Medicare Other | Source: Ambulatory Visit | Attending: Ophthalmology | Admitting: Ophthalmology

## 2011-08-14 ENCOUNTER — Encounter (HOSPITAL_COMMUNITY)
Admission: RE | Disposition: A | Discharge status: Home / Self Care | Payer: Self-pay | Source: Ambulatory Visit | Attending: Ophthalmology

## 2011-08-14 DIAGNOSIS — H269 Unspecified cataract: Secondary | ICD-10-CM | POA: Insufficient documentation

## 2011-08-14 DIAGNOSIS — I10 Essential (primary) hypertension: Secondary | ICD-10-CM | POA: Insufficient documentation

## 2011-08-14 DIAGNOSIS — I2510 Atherosclerotic heart disease of native coronary artery without angina pectoris: Secondary | ICD-10-CM | POA: Insufficient documentation

## 2011-08-14 DIAGNOSIS — I252 Old myocardial infarction: Secondary | ICD-10-CM | POA: Insufficient documentation

## 2011-08-14 DIAGNOSIS — J449 Chronic obstructive pulmonary disease, unspecified: Secondary | ICD-10-CM | POA: Insufficient documentation

## 2011-08-14 DIAGNOSIS — N186 End stage renal disease: Secondary | ICD-10-CM

## 2011-08-14 DIAGNOSIS — J4489 Other specified chronic obstructive pulmonary disease: Secondary | ICD-10-CM | POA: Insufficient documentation

## 2011-08-14 DIAGNOSIS — H2510 Age-related nuclear cataract, unspecified eye: Secondary | ICD-10-CM

## 2011-08-14 HISTORY — DX: Chronic obstructive pulmonary disease, unspecified: J44.9

## 2011-08-14 HISTORY — PX: CATARACT EXTRACTION W/PHACO: SHX586

## 2011-08-14 HISTORY — DX: Reserved for inherently not codable concepts without codable children: IMO0001

## 2011-08-14 HISTORY — DX: Pneumonia, unspecified organism: J18.9

## 2011-08-14 HISTORY — DX: Encounter for other specified aftercare: Z51.89

## 2011-08-14 HISTORY — DX: Malignant (primary) neoplasm, unspecified: C80.1

## 2011-08-14 HISTORY — DX: Cardiac arrhythmia, unspecified: I49.9

## 2011-08-14 LAB — POCT I-STAT 4, (NA,K, GLUC, HGB,HCT)
Glucose, Bld: 93 mg/dL (ref 70–99)
Hemoglobin: 10.2 g/dL — ABNORMAL LOW (ref 13.0–17.0)
Potassium: 3.8 mEq/L (ref 3.5–5.1)
Sodium: 139 mEq/L (ref 135–145)

## 2011-08-14 SURGERY — PHACOEMULSIFICATION, CATARACT, WITH IOL INSERTION
Anesthesia: Monitor Anesthesia Care | Site: Eye | Laterality: Left | Wound class: Clean

## 2011-08-14 MED ORDER — SODIUM CHLORIDE 0.9 % IV SOLN
INTRAVENOUS | Status: DC | PRN
  Administered 2011-08-14: 12:00:00 via INTRAVENOUS

## 2011-08-14 MED ORDER — HYALURONIDASE OVINE 200 UNIT/ML IJ SOLN
INTRAMUSCULAR | Status: DC | PRN
  Administered 2011-08-14: 13:00:00 via INTRAMUSCULAR

## 2011-08-14 MED ORDER — EPINEPHRINE HCL 1 MG/ML IJ SOLN
INTRAMUSCULAR | Status: DC | PRN
  Administered 2011-08-14: .3 mg

## 2011-08-14 MED ORDER — BUPIVACAINE HCL 0.75 % IJ SOLN
INTRAMUSCULAR | Status: DC | PRN
  Administered 2011-08-14: 10 mL

## 2011-08-14 MED ORDER — KETOROLAC TROMETHAMINE 0.5 % OP SOLN
1.0000 [drp] | OPHTHALMIC | Status: AC
  Administered 2011-08-14 (×2): 1 [drp] via OPHTHALMIC
  Filled 2011-08-14: qty 5

## 2011-08-14 MED ORDER — CYCLOPENTOLATE-PHENYLEPHRINE 0.2-1 % OP SOLN
1.0000 [drp] | OPHTHALMIC | Status: AC
  Administered 2011-08-14 (×2): 1 [drp] via OPHTHALMIC
  Filled 2011-08-14: qty 2

## 2011-08-14 MED ORDER — PROPOFOL 10 MG/ML IV EMUL
INTRAVENOUS | Status: DC | PRN
  Administered 2011-08-14: 50 mg via INTRAVENOUS

## 2011-08-14 MED ORDER — ACETYLCHOLINE CHLORIDE 1:100 IO SOLR
INTRAOCULAR | Status: DC | PRN
  Administered 2011-08-14: 20 mg via INTRAOCULAR

## 2011-08-14 MED ORDER — TETRACAINE HCL 0.5 % OP SOLN
OPHTHALMIC | Status: DC | PRN
  Administered 2011-08-14: 4 [drp] via OPHTHALMIC

## 2011-08-14 MED ORDER — SODIUM HYALURONATE 10 MG/ML IO SOLN
INTRAOCULAR | Status: DC | PRN
  Administered 2011-08-14: 0.85 mL via INTRAOCULAR

## 2011-08-14 MED ORDER — BSS IO SOLN
INTRAOCULAR | Status: DC | PRN
  Administered 2011-08-14: 500 mL via INTRAOCULAR

## 2011-08-14 MED ORDER — FENTANYL CITRATE 0.05 MG/ML IJ SOLN
INTRAMUSCULAR | Status: DC | PRN
  Administered 2011-08-14: 50 ug via INTRAVENOUS

## 2011-08-14 MED ORDER — BALANCED SALT IO SOLN
INTRAOCULAR | Status: DC | PRN
  Administered 2011-08-14: 15 mL via INTRAOCULAR

## 2011-08-14 MED ORDER — LIDOCAINE-EPINEPHRINE 2 %-1:100000 IJ SOLN
INTRAMUSCULAR | Status: DC | PRN
  Administered 2011-08-14: 10 mL

## 2011-08-14 MED ORDER — KETOROLAC TROMETHAMINE 0.5 % OP SOLN: 1.0000 [drp] | Freq: Four times a day (QID) | OPHTHALMIC | Status: AC

## 2011-08-14 MED ORDER — PHENYLEPHRINE HCL 2.5 % OP SOLN
1.0000 [drp] | OPHTHALMIC | Status: AC
  Administered 2011-08-14 (×3): 1 [drp] via OPHTHALMIC
  Filled 2011-08-14: qty 3

## 2011-08-14 MED ORDER — MIDAZOLAM HCL 5 MG/5ML IJ SOLN
INTRAMUSCULAR | Status: DC | PRN
  Administered 2011-08-14 (×2): 1 mg via INTRAVENOUS

## 2011-08-14 MED ORDER — TOBRAMYCIN 0.3 % OP OINT
TOPICAL_OINTMENT | OPHTHALMIC | Status: DC | PRN
  Administered 2011-08-14: 1 via OPHTHALMIC

## 2011-08-14 MED ORDER — NA CHONDROIT SULF-NA HYALURON 40-30 MG/ML IO SOLN
INTRAOCULAR | Status: DC | PRN
  Administered 2011-08-14 (×2): 0.5 mL via INTRAOCULAR

## 2011-08-14 SURGICAL SUPPLY — 41 items
APL SRG 3 HI ABS STRL LF PLS (MISCELLANEOUS) ×1
APPLICATOR COTTON TIP 6IN STRL (MISCELLANEOUS) ×2 IMPLANT
APPLICATOR DR MATTHEWS STRL (MISCELLANEOUS) ×2 IMPLANT
BLADE KERATOME 2.75 (BLADE) ×2 IMPLANT
BLADE MINI RND TIP GREEN BEAV (BLADE) IMPLANT
BLADE STAB KNIFE 45DEG (BLADE) IMPLANT
CANNULA ANTERIOR CHAMBER 27GA (MISCELLANEOUS) ×2 IMPLANT
CLOTH BEACON ORANGE TIMEOUT ST (SAFETY) ×2 IMPLANT
CORDS BIPOLAR (ELECTRODE) IMPLANT
DRAPE OPHTHALMIC 40X48 W POUCH (DRAPES) ×2 IMPLANT
DRAPE RETRACTOR (MISCELLANEOUS) ×2 IMPLANT
FILTER BLUE MILLIPORE (MISCELLANEOUS) IMPLANT
GLOVE BIO SURGEON STRL SZ8 (GLOVE) ×2 IMPLANT
GLOVE BIO SURGEON STRL SZ8.5 (GLOVE) ×1 IMPLANT
GLOVE BIOGEL PI IND STRL 6.5 (GLOVE) IMPLANT
GLOVE BIOGEL PI INDICATOR 6.5 (GLOVE) ×1
GLOVE ECLIPSE 7.0 STRL STRAW (GLOVE) ×2 IMPLANT
GOWN STRL NON-REIN LRG LVL3 (GOWN DISPOSABLE) ×4 IMPLANT
KIT ROOM TURNOVER OR (KITS) ×2 IMPLANT
KNIFE CRESCENT 2.5 55 ANG (BLADE) IMPLANT
LENS IOL ACRSF IQ PC 21.0 (Intraocular Lens) IMPLANT
LENS IOL ACRYSOF IQ POST 21.0 (Intraocular Lens) ×2 IMPLANT
MARKER SKIN DUAL TIP RULER LAB (MISCELLANEOUS) ×2 IMPLANT
MASK EYE SHIELD (GAUZE/BANDAGES/DRESSINGS) ×1 IMPLANT
NDL 25GX 5/8IN NON SAFETY (NEEDLE) ×1 IMPLANT
NEEDLE 25GX 5/8IN NON SAFETY (NEEDLE) ×2 IMPLANT
NS IRRIG 1000ML POUR BTL (IV SOLUTION) ×2 IMPLANT
PACK CATARACT CUSTOM (CUSTOM PROCEDURE TRAY) ×2 IMPLANT
PACK CATARACT MCHSCP (PACKS) ×2 IMPLANT
PAD ARMBOARD 7.5X6 YLW CONV (MISCELLANEOUS) ×3 IMPLANT
PAD EYE OVAL STERILE LF (GAUZE/BANDAGES/DRESSINGS) ×1 IMPLANT
PROBE ANTERIOR 20G W/INFUS NDL (MISCELLANEOUS) IMPLANT
SPEAR EYE SURG WECK-CEL (MISCELLANEOUS) IMPLANT
SUT ETHILON 10 0 CS140 6 (SUTURE) ×2 IMPLANT
SUT SILK 4 0 C 3 735G (SUTURE) IMPLANT
SUT SILK 6 0 G 6 (SUTURE) IMPLANT
SUT VICRYL 8 0 TG140 8 (SUTURE) IMPLANT
SYR 3ML LL SCALE MARK (SYRINGE) IMPLANT
TAPE TRANSPARENT 1/2IN (GAUZE/BANDAGES/DRESSINGS) ×1 IMPLANT
TOWEL OR 17X24 6PK STRL BLUE (TOWEL DISPOSABLE) ×4 IMPLANT
WATER STERILE IRR 1000ML POUR (IV SOLUTION) ×2 IMPLANT

## 2011-08-14 NOTE — Anesthesia Preprocedure Evaluation (Addendum)
Anesthesia Evaluation  Patient identified by MRN, date of birth, ID band Patient awake and Patient confused    Reviewed: Allergy & Precautions, H&P , NPO status , Patient's Chart, lab work & pertinent test results, reviewed documented beta blocker date and time   Airway Mallampati: II TM Distance: >3 FB     Dental  (+) Edentulous Upper   Pulmonary COPD clear to auscultation        Cardiovascular hypertension, Pt. on medications + CAD and + Past MI Regular Normal    Neuro/Psych PSYCHIATRIC DISORDERS Anxiety  Neuromuscular disease    GI/Hepatic   Endo/Other    Renal/GU      Musculoskeletal   Abdominal   Peds  Hematology   Anesthesia Other Findings   Reproductive/Obstetrics                          Anesthesia Physical Anesthesia Plan  ASA: III  Anesthesia Plan: MAC   Post-op Pain Management:    Induction: Intravenous  Airway Management Planned: Simple Face Mask  Additional Equipment:   Intra-op Plan:   Post-operative Plan:   Informed Consent: I have reviewed the patients History and Physical, chart, labs and discussed the procedure including the risks, benefits and alternatives for the proposed anesthesia with the patient or authorized representative who has indicated his/her understanding and acceptance.   Dental advisory given  Plan Discussed with:   Anesthesia Plan Comments: (ESRD last HD 1/22 K-3.8 Htn CAD S/P PTCA with Stent RCA 02/2007 No angina  Plan MAC  Kipp Brood, MD  )      Anesthesia Quick Evaluation

## 2011-08-14 NOTE — Anesthesia Postprocedure Evaluation (Signed)
  Anesthesia Post-op Note  Patient: [name]  Procedure(s) Performed:  CATARACT EXTRACTION PHACO AND INTRAOCULAR LENS PLACEMENT (IOC)  Patient Location: Short Stay  Anesthesia Type: MAC  Level of Consciousness: awake, alert , oriented and patient cooperative  Airway and Oxygen Therapy: Patient Spontanous Breathing  Post-op Pain: none  Post-op Assessment: Post-op Vital signs reviewed, Patient's Cardiovascular Status Stable, Respiratory Function Stable, Patent Airway, No signs of Nausea or vomiting, Adequate PO intake and Pain level controlled  Post-op Vital Signs: Reviewed and stable  Complications: No apparent anesthesia complications

## 2011-08-14 NOTE — Op Note (Signed)
Preoperative diagnosis: Visually significant cataract left eye Postoperative diagnosis: Same Procedure: Phacoemulsification with intraocular lens implant left eye Complications: None Anesthesia: 2% Xylocaine with epinephrine in a 50-50 mixture of 0.75% Marcaine with an ampule of [place] Surgeon: Lajean Saver M.D. Procedure: The patient was taken to the operating room where he was given a peribulbar block with the aforementioned local anesthetic is undermined to anesthesia. Following this the patient's face was prepped and draped in the usual sterile fashion. With the surgeon sitting temporally and the operating microscope and positioned a Weck-Cel sponge was used to fixate the globe in a 15 blade was used to enter through inferior clear cornea Viscoat was injected in the eye following this an additional Weck-Cel sponge was used to fixate the globe and a 2.75 mm keratome blade was used in a stepwise fashion the temporal clear cornea to into the eye and additional Viscoat was injected. A bent 26-gauge needle was used to incise the anterior capsule and a continuous tear curvilinear capsulorrhexis was performed the capsule forceps was used to remove the anterior capsule. BSS was used to hydrodissect and hydrodelineate the nucleus the phacoemulsification unit was then used to remove the epinucleus and sculpt the nucleus the nucleus was divided into 3 quadrants and all nuclear fragments were removed by elevating the last fragment with Viscoat following this the epinucleus was elevated using a Binkhorst cannula. The epinucleus was aspirated using the I/A and the rest of the cortical material was stripped from the posterior capsule. The posterior capsule remained intact and therefore Provisc was injected in the eye and the intraocular lens implant was examined and noted to have no defects. The implant was an AcrySof IQ 21.0 diopter lens SN #[id number] the lens was placed in the lens injector and injected into  the capsular bag it was unfolded and positioned with a Kuglen hook. the curved tip I/A was then used to remove subincisional cortex and viscoelastic from the eye. The posterior capsule remained intact. Miostat was injected the eye was pressurized the and there was no leakage after a single 10-0 nylon suture was placed. TobraDex ointment was applied to the eye after all instrumentation had been removed a patch and Fox her were placed and the patient returned to recovery area in stable condition.

## 2011-08-14 NOTE — Transfer of Care (Signed)
Immediate Anesthesia Transfer of Care Note  Patient: [name]  Procedure(s) Performed:  CATARACT EXTRACTION PHACO AND INTRAOCULAR LENS PLACEMENT (IOC)  Patient Location: Short Stay  Anesthesia Type: MAC  Level of Consciousness: awake, alert , oriented and patient cooperative  Airway & Oxygen Therapy: Patient Spontanous Breathing  Post-op Assessment: Report given to PACU RN, Post -op Vital signs reviewed and stable and Patient moving all extremities  Post vital signs: Reviewed and stable  Complications: No apparent anesthesia complications

## 2011-08-14 NOTE — H&P (View-Only) (Signed)
SUBJECTIVE:  Chief Complaint:  Blurry OS interferring with reading, TV watching, & other daily activities    OD 03-15-2010  phacoemulsification with intraocular lens implant  HISTORY OF PRESENT ILLNESS:  Onset: Years   Duration:  Years   Quality: Worse     Location:  Left   Intensity / Severity: Moderate   Context / When:  Happened Before    Mental Status: Oriented to Time, Person, Place   REVIEW OF SYSTEMS:   GEN: Denies weight changes, appetite changes, unusual weakness, bleeding, fever, chills, recent trauma or infections  HEENT: Denies vision changes, hearing changes, epistaxis, unusual sneezing, sore throat, swallowing difficulties, ear pain, or facial pain.  NECK: Denies neck pain swelling or stiffness.  LUNGS: Denies cough, dyspnea, orthopnea, or hemoptosis.  HEART: . Confirms HIGH BLOOD PRESSURE.  ABD: Denies abdomen pain, eructation, nausea, vomiting, hematemesis, diarrhea, constipation, hematochezia, melena, acholic stools, or flatulence.  GENT: hemodialysis 3 times a week  BJE: Denies arthralgia, joint stiffness, back pain, muscle cramps, or myalgia.  SKIN: Denies rashes, lesions, anhidrosis, bruising, or pruritus.  NEURO: Denies memory loss, disorientation, syncope, diplopia, dizziness, vertigo, clumsiness, paresthesias or cephalgia.      OBJECTIVE:   EYE EXAM:   VA:  OD [place] 20/25 OS [place] 20/200 PH: ni  Glasses: OD   OS   ADD    MR:  od - 1.25 sph 20/200 os +0.75 + 1.25 x 20/25   VF: Full to Confrontation Testing. ou  Ocular Adnexa: normal ou  Motility: ortho full  SLE Conjunctiva: Pinguecula. ou  Pupils:4mm ou reactive ou  Iris: brown ou  Cornea: arcus ou temporal suture (-) Seidel  Anterior Chamber: deep +1 cell & flare OD and IOL in good position  Lens: POSTERIOR CHAMBER Intra Ocular Lens OD            OS      3+ NUCLEAR SCLEROSIS, CATARACT SENILE #366.16.. & posterior subcapsular 366.14    VITREOUS: POSTERIOR VITREOUS  DETACHMENT #379.21.   TARGET Ta:  IOP:8 / 1 OD and OS  Time: 11:30 a.m.  Gonio:  Dilation: PHENYLEPHRINE 2.5%. TROPICAMIDE 1%.    Optic discs:60% od 60% os with drusen  macula: clear ou Retina&vessels: narrow arterioles w/difficult view      PHYSICAL  Blood Pressure:140/76  Pulse:68  Respiration:22  GENERAL: No acute distress  HEENT: MM's WNL, TM's normal, PERLA, Conjunctiva WNL, Fundi  As  note above  NECK: Full ROM, no adenopathy or masses, Thyroid WNL  LUNGS: Clear to auscultation, equal BS  HEART: RR&R, GR 11/IV murmur, gallop, or rub, not enlarged  ABD: Normal  GENT: deferred  BJE: normal  NEURO: CN II through XII intact  SKIN: normal  STUDIES:  A-Scan     ASSSSMEENT:  Nuclear sclerosis   ICD#366.16  /posterior subcapsular 366.14 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 KIDNEY PROBLEMS, 4. Hypertension (high blood pressure) #401.9. ,HEART ATTACTS 3,AND SEVERAL BLOCKAGES STINTS  5.Chronic Obstructive Pulmonary Disease #496. 66GOUT  PLAN:  A-Scan, today Cataract Sugery,Patient understand the benefits and risk of surgery and has agreed to proceed at this time with phacoemulsification with intraocular lens implant OS   Letter Dr. Kadakia (medical [facility])      

## 2011-08-14 NOTE — Preoperative (Signed)
Beta Blockers   Reason not to administer Beta Blockers:Not Applicable 

## 2011-08-14 NOTE — Interval H&P Note (Signed)
History and Physical Interval Note:  08/14/2011 12:51 PM  [name]  has presented today for surgery, with the diagnosis of Cataract Left Eye  The various methods of treatment have been discussed with the patient and family. After consideration of risks, benefits and other options for treatment, the patient has consented to  Procedure(s): CATARACT EXTRACTION PHACO AND INTRAOCULAR LENS PLACEMENT (IOC) as a surgical intervention .  The patients' history has been reviewed, patient examined, no change in status, stable for surgery.  I have reviewed the patients' chart and labs.  Questions were answered to the patient's satisfaction.   The patient's MR OD 20/25  OS 20/200  NI  Ta OS    [name]

## 2011-08-15 ENCOUNTER — Encounter: Payer: Self-pay | Admitting: *Deleted

## 2011-09-25 ENCOUNTER — Other Ambulatory Visit (HOSPITAL_COMMUNITY): Payer: Self-pay | Admitting: Nephrology

## 2011-09-25 DIAGNOSIS — N186 End stage renal disease: Secondary | ICD-10-CM

## 2011-09-26 ENCOUNTER — Ambulatory Visit (HOSPITAL_COMMUNITY)
Admission: RE | Admit: 2011-09-26 | Discharge: 2011-09-26 | Disposition: A | Discharge status: Home / Self Care | Payer: Medicare Other | Source: Ambulatory Visit | Attending: Nephrology | Admitting: Nephrology

## 2011-09-26 DIAGNOSIS — T82898A Other specified complication of vascular prosthetic devices, implants and grafts, initial encounter: Secondary | ICD-10-CM | POA: Insufficient documentation

## 2011-09-26 DIAGNOSIS — Y831 Surgical operation with implant of artificial internal device as the cause of abnormal reaction of the patient, or of later complication, without mention of misadventure at the time of the procedure: Secondary | ICD-10-CM | POA: Insufficient documentation

## 2011-09-26 DIAGNOSIS — N186 End stage renal disease: Secondary | ICD-10-CM

## 2011-09-26 MED ORDER — HEPARIN SODIUM (PORCINE) 1000 UNIT/ML IJ SOLN
INTRAMUSCULAR | Status: AC
  Filled 2011-09-26: qty 1

## 2011-09-26 MED ORDER — CHLORHEXIDINE GLUCONATE 4 % EX LIQD
CUTANEOUS | Status: AC
  Filled 2011-09-26: qty 30

## 2011-09-26 NOTE — Procedures (Signed)
Procedure:  Right tunneled HD cath repair Arterial lumen clamp and hub replaced using sterile technique.

## 2011-09-27 ENCOUNTER — Telehealth (HOSPITAL_COMMUNITY): Payer: Self-pay

## 2011-12-30 ENCOUNTER — Encounter (HOSPITAL_COMMUNITY): Payer: Self-pay | Admitting: Emergency Medicine

## 2011-12-30 ENCOUNTER — Emergency Department (HOSPITAL_COMMUNITY): Payer: Medicare Other

## 2011-12-30 ENCOUNTER — Inpatient Hospital Stay (HOSPITAL_COMMUNITY)
Admission: EM | Admit: 2011-12-30 | Discharge: 2012-01-01 | DRG: 811 | Disposition: A | Discharge status: Home / Self Care | Payer: Medicare Other | Attending: Cardiovascular Disease | Admitting: Cardiovascular Disease

## 2011-12-30 ENCOUNTER — Inpatient Hospital Stay (HOSPITAL_COMMUNITY): Payer: Medicare Other

## 2011-12-30 DIAGNOSIS — J449 Chronic obstructive pulmonary disease, unspecified: Secondary | ICD-10-CM | POA: Diagnosis present

## 2011-12-30 DIAGNOSIS — I120 Hypertensive chronic kidney disease with stage 5 chronic kidney disease or end stage renal disease: Secondary | ICD-10-CM | POA: Diagnosis present

## 2011-12-30 DIAGNOSIS — D631 Anemia in chronic kidney disease: Principal | ICD-10-CM | POA: Diagnosis present

## 2011-12-30 DIAGNOSIS — N039 Chronic nephritic syndrome with unspecified morphologic changes: Principal | ICD-10-CM | POA: Diagnosis present

## 2011-12-30 DIAGNOSIS — R918 Other nonspecific abnormal finding of lung field: Secondary | ICD-10-CM

## 2011-12-30 DIAGNOSIS — J90 Pleural effusion, not elsewhere classified: Secondary | ICD-10-CM

## 2011-12-30 DIAGNOSIS — J9819 Other pulmonary collapse: Secondary | ICD-10-CM | POA: Diagnosis present

## 2011-12-30 DIAGNOSIS — F411 Generalized anxiety disorder: Secondary | ICD-10-CM | POA: Diagnosis present

## 2011-12-30 DIAGNOSIS — Z961 Presence of intraocular lens: Secondary | ICD-10-CM

## 2011-12-30 DIAGNOSIS — Z992 Dependence on renal dialysis: Secondary | ICD-10-CM

## 2011-12-30 DIAGNOSIS — N2581 Secondary hyperparathyroidism of renal origin: Secondary | ICD-10-CM | POA: Diagnosis present

## 2011-12-30 DIAGNOSIS — M129 Arthropathy, unspecified: Secondary | ICD-10-CM | POA: Diagnosis present

## 2011-12-30 DIAGNOSIS — I209 Angina pectoris, unspecified: Secondary | ICD-10-CM | POA: Diagnosis present

## 2011-12-30 DIAGNOSIS — R5383 Other fatigue: Secondary | ICD-10-CM

## 2011-12-30 DIAGNOSIS — J4489 Other specified chronic obstructive pulmonary disease: Secondary | ICD-10-CM | POA: Diagnosis present

## 2011-12-30 DIAGNOSIS — I2510 Atherosclerotic heart disease of native coronary artery without angina pectoris: Secondary | ICD-10-CM | POA: Diagnosis present

## 2011-12-30 DIAGNOSIS — E785 Hyperlipidemia, unspecified: Secondary | ICD-10-CM | POA: Diagnosis present

## 2011-12-30 DIAGNOSIS — N186 End stage renal disease: Secondary | ICD-10-CM | POA: Diagnosis present

## 2011-12-30 DIAGNOSIS — I10 Essential (primary) hypertension: Secondary | ICD-10-CM | POA: Diagnosis present

## 2011-12-30 DIAGNOSIS — I252 Old myocardial infarction: Secondary | ICD-10-CM

## 2011-12-30 DIAGNOSIS — C3430 Malignant neoplasm of lower lobe, unspecified bronchus or lung: Secondary | ICD-10-CM | POA: Diagnosis present

## 2011-12-30 DIAGNOSIS — D649 Anemia, unspecified: Secondary | ICD-10-CM

## 2011-12-30 DIAGNOSIS — Z9849 Cataract extraction status, unspecified eye: Secondary | ICD-10-CM

## 2011-12-30 DIAGNOSIS — R0602 Shortness of breath: Secondary | ICD-10-CM | POA: Diagnosis present

## 2011-12-30 DIAGNOSIS — Z87891 Personal history of nicotine dependence: Secondary | ICD-10-CM

## 2011-12-30 DIAGNOSIS — E875 Hyperkalemia: Secondary | ICD-10-CM

## 2011-12-30 DIAGNOSIS — Z9861 Coronary angioplasty status: Secondary | ICD-10-CM

## 2011-12-30 DIAGNOSIS — M109 Gout, unspecified: Secondary | ICD-10-CM | POA: Diagnosis present

## 2011-12-30 DIAGNOSIS — N189 Chronic kidney disease, unspecified: Secondary | ICD-10-CM | POA: Diagnosis present

## 2011-12-30 LAB — PREPARE RBC (CROSSMATCH)

## 2011-12-30 LAB — BASIC METABOLIC PANEL
CO2: 26 mEq/L (ref 19–32)
Calcium: 9.3 mg/dL (ref 8.4–10.5)
Glucose, Bld: 94 mg/dL (ref 70–99)
Potassium: 5.2 mEq/L — ABNORMAL HIGH (ref 3.5–5.1)
Sodium: 133 mEq/L — ABNORMAL LOW (ref 135–145)

## 2011-12-30 LAB — HEPATITIS B SURFACE ANTIGEN: Hepatitis B Surface Ag: NEGATIVE

## 2011-12-30 LAB — CBC
HCT: 18.5 % — ABNORMAL LOW (ref 39.0–52.0)
MCHC: 35.1 g/dL (ref 30.0–36.0)
Platelets: 214 10*3/uL (ref 150–400)
RDW: 16.9 % — ABNORMAL HIGH (ref 11.5–15.5)

## 2011-12-30 LAB — CARDIAC PANEL(CRET KIN+CKTOT+MB+TROPI)
CK, MB: 2.8 ng/mL (ref 0.3–4.0)
Troponin I: <0.3 ng/mL (ref <0.30)

## 2011-12-30 LAB — PRO B NATRIURETIC PEPTIDE: Pro B Natriuretic peptide (BNP): 60860 pg/mL — ABNORMAL HIGH (ref 0–450)

## 2011-12-30 MED ORDER — ASPIRIN EC 81 MG PO TBEC
81.0000 mg | DELAYED_RELEASE_TABLET | Freq: Every day | ORAL | Status: DC
  Administered 2011-12-31 (×2): 81 mg via ORAL
  Filled 2011-12-30 (×3): qty 1

## 2011-12-30 MED ORDER — ALTEPLASE 2 MG IJ SOLR
2.0000 mg | Freq: Once | INTRAMUSCULAR | Status: AC | PRN
  Filled 2011-12-30: qty 2

## 2011-12-30 MED ORDER — SODIUM POLYSTYRENE SULFONATE 15 GM/60ML PO SUSP
45.0000 g | Freq: Once | ORAL | Status: AC
  Administered 2011-12-30: 45 g via ORAL
  Filled 2011-12-30: qty 180

## 2011-12-30 MED ORDER — CLOPIDOGREL BISULFATE 75 MG PO TABS
75.0000 mg | ORAL_TABLET | Freq: Every day | ORAL | Status: DC
  Administered 2011-12-31 – 2012-01-01 (×2): 75 mg via ORAL
  Filled 2011-12-30 (×2): qty 1

## 2011-12-30 MED ORDER — NITROGLYCERIN 0.4 MG SL SUBL: 0.4000 mg | SUBLINGUAL_TABLET | SUBLINGUAL | Status: DC | PRN

## 2011-12-30 MED ORDER — ONDANSETRON HCL 4 MG/2ML IJ SOLN: 4.0000 mg | Freq: Four times a day (QID) | INTRAMUSCULAR | Status: DC | PRN

## 2011-12-30 MED ORDER — SODIUM CHLORIDE 0.9 % IV SOLN: 100.0000 mL | INTRAVENOUS | Status: DC | PRN

## 2011-12-30 MED ORDER — OXYCODONE HCL 5 MG PO TABS
5.0000 mg | ORAL_TABLET | ORAL | Status: DC | PRN
  Administered 2011-12-31: 5 mg via ORAL
  Filled 2011-12-30: qty 1

## 2011-12-30 MED ORDER — DEXTROSE 50 % IV SOLN
1.0000 | Freq: Once | INTRAVENOUS | Status: AC
  Administered 2011-12-30: 50 mL via INTRAVENOUS
  Filled 2011-12-30: qty 50

## 2011-12-30 MED ORDER — RENO CAPS 1 MG PO CAPS: 1.0000 | ORAL_CAPSULE | Freq: Every day | ORAL | Status: DC

## 2011-12-30 MED ORDER — CLONIDINE HCL 0.1 MG PO TABS
0.2000 mg | ORAL_TABLET | Freq: Two times a day (BID) | ORAL | Status: DC
  Administered 2011-12-31 – 2012-01-01 (×4): 0.2 mg via ORAL
  Filled 2011-12-30 (×6): qty 2

## 2011-12-30 MED ORDER — FUROSEMIDE 20 MG PO TABS
40.0000 mg | ORAL_TABLET | Freq: Two times a day (BID) | ORAL | Status: DC
  Administered 2011-12-31 (×3): 40 mg via ORAL
  Filled 2011-12-30 (×6): qty 2

## 2011-12-30 MED ORDER — LISINOPRIL 20 MG PO TABS
20.0000 mg | ORAL_TABLET | Freq: Every evening | ORAL | Status: DC
  Administered 2011-12-31 (×2): 20 mg via ORAL
  Filled 2011-12-30 (×3): qty 1

## 2011-12-30 MED ORDER — LIDOCAINE-PRILOCAINE 2.5-2.5 % EX CREA: 1.0000 "application " | TOPICAL_CREAM | CUTANEOUS | Status: DC | PRN

## 2011-12-30 MED ORDER — IOHEXOL 350 MG/ML SOLN
100.0000 mL | Freq: Once | INTRAVENOUS | Status: AC | PRN
  Administered 2011-12-30: 100 mL via INTRAVENOUS

## 2011-12-30 MED ORDER — RENAL MULTIVITAMIN FORMULA PO TABS
1.0000 | ORAL_TABLET | Freq: Every day | ORAL | Status: DC
  Administered 2011-12-31: 1 via ORAL
  Filled 2011-12-30: qty 1

## 2011-12-30 MED ORDER — NIFEDIPINE ER 60 MG PO TB24
60.0000 mg | ORAL_TABLET | Freq: Every day | ORAL | Status: DC
  Administered 2011-12-31 – 2012-01-01 (×3): 60 mg via ORAL
  Filled 2011-12-30 (×3): qty 1

## 2011-12-30 MED ORDER — SODIUM CHLORIDE 0.9 % IJ SOLN
3.0000 mL | Freq: Two times a day (BID) | INTRAMUSCULAR | Status: DC
  Administered 2011-12-31 (×3): 3 mL via INTRAVENOUS

## 2011-12-30 MED ORDER — LORAZEPAM 1 MG PO TABS
0.5000 mg | ORAL_TABLET | Freq: Every day | ORAL | Status: DC | PRN
  Administered 2011-12-31 – 2012-01-01 (×2): 0.5 mg via ORAL
  Filled 2011-12-30 (×2): qty 1

## 2011-12-30 MED ORDER — ISOSORBIDE MONONITRATE ER 60 MG PO TB24
60.0000 mg | ORAL_TABLET | Freq: Every day | ORAL | Status: DC
  Administered 2011-12-31 – 2012-01-01 (×3): 60 mg via ORAL
  Filled 2011-12-30 (×3): qty 1

## 2011-12-30 MED ORDER — PENTAFLUOROPROP-TETRAFLUOROETH EX AERO: 1.0000 "application " | INHALATION_SPRAY | CUTANEOUS | Status: DC | PRN

## 2011-12-30 MED ORDER — IOHEXOL 350 MG/ML SOLN
80.0000 mL | Freq: Once | INTRAVENOUS | Status: AC | PRN
  Administered 2011-12-30: 80 mL via INTRAVENOUS

## 2011-12-30 MED ORDER — MONTELUKAST SODIUM 10 MG PO TABS
10.0000 mg | ORAL_TABLET | Freq: Every day | ORAL | Status: DC
  Administered 2011-12-31 (×2): 10 mg via ORAL
  Filled 2011-12-30 (×3): qty 1

## 2011-12-30 MED ORDER — ONDANSETRON HCL 4 MG PO TABS: 4.0000 mg | ORAL_TABLET | Freq: Four times a day (QID) | ORAL | Status: DC | PRN

## 2011-12-30 MED ORDER — NEPRO/CARBSTEADY PO LIQD: 237.0000 mL | ORAL | Status: DC | PRN

## 2011-12-30 MED ORDER — SIMVASTATIN 10 MG PO TABS
10.0000 mg | ORAL_TABLET | Freq: Every day | ORAL | Status: DC
  Administered 2011-12-31 (×2): 10 mg via ORAL
  Filled 2011-12-30 (×3): qty 1

## 2011-12-30 MED ORDER — DOCUSATE SODIUM 100 MG PO CAPS
100.0000 mg | ORAL_CAPSULE | Freq: Two times a day (BID) | ORAL | Status: DC
  Administered 2011-12-31 (×3): 100 mg via ORAL
  Filled 2011-12-30 (×5): qty 1

## 2011-12-30 MED ORDER — ALUM & MAG HYDROXIDE-SIMETH 200-200-20 MG/5ML PO SUSP
30.0000 mL | Freq: Four times a day (QID) | ORAL | Status: DC | PRN
  Administered 2011-12-31: 30 mL via ORAL
  Filled 2011-12-30: qty 30

## 2011-12-30 MED ORDER — INSULIN ASPART 100 UNIT/ML ~~LOC~~ SOLN
10.0000 [IU] | Freq: Once | SUBCUTANEOUS | Status: AC
  Administered 2011-12-30: 10 [IU] via INTRAVENOUS
  Filled 2011-12-30: qty 10

## 2011-12-30 MED ORDER — LIDOCAINE HCL (PF) 1 % IJ SOLN: 5.0000 mL | INTRAMUSCULAR | Status: DC | PRN

## 2011-12-30 NOTE — H&P (Signed)
[name] is an 78 y.o. male.   Chief Complaint: Weakness and shortness breath.  HPI: 78 years old black male with chronic shortness of breath and weakness worsening over 1 week and tired with no dialysis for few days. Patient was visiting daughter living in [place]. He has lung mass discovered many years ago. He has declined workup for surgery and/or radiation. He had pleural effusion followed by chest tube placement. Now his drainage from plural place is less than 5 ml. His Hgb is down to 6.5 g/dl.   Past Medical History  Diagnosis Date  . Hypertension   . CAD (coronary artery disease)   . Hyperlipidemia   . Gout   . Arthritis   . Anxiety   . Wears hearing aid   . Neuromuscular disorder     sciatica right leg  . Dialysis patient     M-W-F [address]  . Angina     takes imdur  . Chronic kidney disease     on Hemodialysis  . Cancer 2010    - monitoring- pt did not want tx  . Blood transfusion   . Myocardial infarction     X 4  . Dysrhythmia   . COPD (chronic obstructive pulmonary disease)   . Pneumonia 2009  . Anemia   . Renal insufficiency       Past Surgical History  Procedure Date  . Nose surgery     cauterized for numerous bleeds  . Kidney surgery   . Insertion of dialysis catheter   . US echocardiography   . Av fistula placement 07/11/2011    Procedure: ARTERIOVENOUS (AV) FISTULA CREATION;  Surgeon: Juleen China, MD;  Location: [facility] OR;  Service: Vascular;  Laterality: Left;  . Coronary angioplasty with stent placement 2010  . Diagnostic laparoscopy     remove scar tissue  . Eye surgery     right cataract surgery  . Cataract extraction w/phaco 08/14/2011    Procedure: CATARACT EXTRACTION PHACO AND INTRAOCULAR LENS PLACEMENT (IOC);  Surgeon: Chalmers Guest, MD;  Location: [facility] OR;  Service: Ophthalmology;  Laterality: Left;    Family History  Problem Relation Age of Onset  . Heart disease Mother   . Hypertension Mother   . Heart disease Father   .  COPD Father   . Hypertension Sister   . Heart disease Sister   . Anesthesia problems Neg Hx    Social History:  reports that he quit smoking about 48 years ago. He does not have any smokeless tobacco history on file. He reports that he does not drink alcohol or use illicit drugs.  Allergies:  Allergies  Allergen Reactions  . Ibuprofen Other (See Comments)    Messed up kidneys  . Tarka (Trandolapril-Verapamil Hcl Er) Cough     (Not in a hospital admission)  Results for orders placed during the hospital encounter of 12/30/11 (from the past 48 hour(s))  CBC     Status: Abnormal   Collection Time   12/30/11 10:31 AM      Component Value Range Comment   WBC 8.5  4.0 - 10.5 (K/uL)    RBC 2.14 (*) 4.22 - 5.81 (MIL/uL)    Hemoglobin 6.5 (*) 13.0 - 17.0 (g/dL)    HCT 16.1 (*) 09.6 - 52.0 (%)    MCV 86.4  78.0 - 100.0 (fL)    MCH 30.4  26.0 - 34.0 (pg)    MCHC 35.1  30.0 - 36.0 (g/dL)  RDW 16.9 (*) 11.5 - 15.5 (%)    Platelets 214  150 - 400 (K/uL)   BASIC METABOLIC PANEL     Status: Abnormal   Collection Time   12/30/11 10:31 AM      Component Value Range Comment   Sodium 133 (*) 135 - 145 (mEq/L)    Potassium 5.2 (*) 3.5 - 5.1 (mEq/L)    Chloride 95 (*) 96 - 112 (mEq/L)    CO2 26  19 - 32 (mEq/L)    Glucose, Bld 94  70 - 99 (mg/dL)    BUN 56 (*) 6 - 23 (mg/dL)    Creatinine, Ser 1.61 (*) 0.50 - 1.35 (mg/dL)    Calcium 9.3  8.4 - 10.5 (mg/dL)    GFR calc non Af Amer 6 (*) >90 (mL/min)    GFR calc Af Amer 7 (*) >90 (mL/min)   CARDIAC PANEL(CRET KIN+CKTOT+MB+TROPI)     Status: Normal   Collection Time   12/30/11 10:31 AM      Component Value Range Comment   Total CK 140  7 - 232 (U/L)    CK, MB 2.8  0.3 - 4.0 (ng/mL)    Troponin I <0.30  <0.30 (ng/mL)    Relative Index 2.0  0.0 - 2.5    PRO B NATRIURETIC PEPTIDE     Status: Abnormal   Collection Time   12/30/11 10:31 AM      Component Value Range Comment   Pro B Natriuretic peptide (BNP) 60860.0 (*) 0 - 450 (pg/mL)     TYPE AND SCREEN     Status: Normal   Collection Time   12/30/11 12:41 PM      Component Value Range Comment   ABO/RH(D) O POS      Antibody Screen NEG      Sample Expiration 01/02/2012     ABO/RH     Status: Normal   Collection Time   12/30/11 12:41 PM      Component Value Range Comment   ABO/RH(D) O POS      Dg Chest 2 View  12/30/2011  *RADIOLOGY REPORT*  Clinical Data: Shortness of breath.  CHEST - 2 VIEW  Comparison: 06/06/2011  Findings: There is left base atelectasis or infiltrate with left pleural effusion.  Left pleural catheter is new in the interval. The cardiopericardial silhouette is enlarged.  Right lung is clear. Imaged bony structures of the thorax are intact.  Right IJ dialysis catheter tip projects in the mid right atrium.  IMPRESSION: Left base atelectasis/infiltrate with left pleural effusion.  Left pleural catheter is visualized.  Original Report Authenticated By: ERIC A. MANSELL, M.D.   Ct Angio Chest W/cm &/or Wo Cm  12/30/2011  *RADIOLOGY REPORT*  Clinical Data: Weakness and shortness of breath.  CT ANGIOGRAPHY CHEST  Technique:  Multidetector CT imaging of the chest using the standard protocol during bolus administration of intravenous contrast. Multiplanar reconstructed images including MIPs were obtained and reviewed to evaluate the vascular anatomy.  Contrast: OMNIPAQUE IOHEXOL 350 MG/ML SOLN, OMNIPAQUE IOHEXOL 350 MG/ML SOLN  Comparison: No comparison studies available.  Findings: Initial contrast bolus resulted in poor opacification of the pulmonary arteries.  An additional 80cc bolus was administered and repeat imaging showed improved pulmonary artery opacification.  There is no filling defect within the opacified pulmonary arteries to suggest the presence of an acute pulmonary embolus.  Ascending thoracic aorta measures up to 4.7 cm in diameter, consistent with aneurysm.  There is no dissection of  the thoracic aorta.  The heart is enlarged.  Right IJ  dialysis catheter tip projects in the mid right atrium.  No pericardial effusion.  Lung windows reveal some emphysema.  Patchy airspace disease is seen in the right upper lobe centrally.  There is some compressive atelectasis or infiltrate in the posterior right lower lobe. Spiculated 14 mm pulmonary nodule is seen in the right lower lobe on image 82 of series 11.  4.7 cm mass lesion is seen in the left lower lobe.  An adjacent 2.5 cm mass is evident.  There is some atelectasis or scarring in the posterior lingula.  A left-sided pleural drain is positioned posteriorly within the rim enhancing small to moderate left pleural fluid collection which could be a malignant effusion or empyema.  Bone windows reveal no worrisome lytic or sclerotic osseous lesions.  IMPRESSION: No CT evidence for acute pulmonary embolus.  Approximately 5 cm mass in the left lower lobe with other bilateral spiculated pulmonary nodules, worrisome for neoplastic disease in this patient with history of lung cancer.  Left posterior loculated fluid collection compatible with malignant effusion or empyema.  The pleural drain tracks up along the posterior chest wall within this collection.  Ascending aortic aneurysm.  Original Report Authenticated By: ERIC A. MANSELL, M.D.    @ROS @ Wears glasses, + hearing loss, Wears dentures. + COPD, + CAD, + end stage renal disease, hypertension, lung mass and multiple joint arthritis. No hepatitis, stroke and seizures.  Blood pressure 185/96, pulse 64, temperature 97.9 F (36.6 C), temperature source Oral, resp. rate 20, SpO2 100.00%. HEENT: [place]/AT. Brown eyes, conjunctiva-pale, Sclera- muddy. Wears reading glasses and partial upper dentures. Neck: No JVD, supple. Lungs-Few rhonchi, bilaterally. Left chest tube in place. CVS: Normal S1, S2. II/VI systolic murmur.. Abdomen: Soft Extremities: Trace edema. No cyanosis or clubbing. CNS; AXOX3. Moves all 4 ext.  Assessment Shortness of breath Anemia  of chronic renal disease Hypertension CAD Arthritis End stage renal disease Lung mass with pleural effusion-Chronic  Plan  Admit, renal consult, transfuse PRBC.  Kenyata Napier S 12/30/2011, 2:53 PM

## 2011-12-30 NOTE — Consult Note (Signed)
Referring Provider: No ref. provider found Primary Care Physician:  Ricki Rodriguez, MD, MD Primary Nephrologist:  Dr. Arrie Aran  Reason for Consultation:  Medical management of End Stage renal disease  HPI: Weakness and shortness breath, pleural effusion followed by chest tube placement. Now his drainage from plural place is less than 5 ml. His Hgb is down to 6.5 g/dl.     Past Medical History  Diagnosis Date  . Hypertension   . CAD (coronary artery disease)   . Hyperlipidemia   . Gout   . Arthritis   . Anxiety   . Wears hearing aid   . Neuromuscular disorder     sciatica right leg  . Dialysis patient     M-W-F [address]  . Angina     takes imdur  . Chronic kidney disease     on Hemodialysis  . Cancer 2010    - monitoring- pt did not want tx  . Blood transfusion   . Myocardial infarction     X 4  . Dysrhythmia   . COPD (chronic obstructive pulmonary disease)   . Pneumonia 2009  . Anemia   . Renal insufficiency     Past Surgical History  Procedure Date  . Nose surgery     cauterized for numerous bleeds  . Kidney surgery   . Insertion of dialysis catheter   . US echocardiography   . Av fistula placement 07/11/2011    Procedure: ARTERIOVENOUS (AV) FISTULA CREATION;  Surgeon: Juleen China, MD;  Location: [facility] OR;  Service: Vascular;  Laterality: Left;  . Coronary angioplasty with stent placement 2010  . Diagnostic laparoscopy     remove scar tissue  . Eye surgery     right cataract surgery  . Cataract extraction w/phaco 08/14/2011    Procedure: CATARACT EXTRACTION PHACO AND INTRAOCULAR LENS PLACEMENT (IOC);  Surgeon: Chalmers Guest, MD;  Location: [facility] OR;  Service: Ophthalmology;  Laterality: Left;    Prior to Admission medications   Medication Sig Start Date End Date Taking? Authorizing Provider  aspirin EC 81 MG tablet Take 81 mg by mouth at bedtime. Does not take on Sunday's   Yes Historical Provider, MD  B Complex-C-Folic Acid ([place] CAPS PO) Take 1  capsule by mouth every evening.   Yes Historical Provider, MD  cloNIDine (CATAPRES) 0.2 MG tablet Take 0.2 mg by mouth 2 (two) times daily.  07/11/11  Yes Historical Provider, MD  clopidogrel (PLAVIX) 75 MG tablet Take 75 mg by mouth daily.   Yes Historical Provider, MD  furosemide (LASIX) 40 MG tablet Take 40 mg by mouth 2 (two) times daily.    Yes Historical Provider, MD  isosorbide mononitrate (IMDUR) 60 MG 24 hr tablet Take 60 mg by mouth daily.    Yes Historical Provider, MD  lisinopril (PRINIVIL,ZESTRIL) 20 MG tablet Take 20 mg by mouth every evening.    Yes Historical Provider, MD  LORazepam (ATIVAN) 0.5 MG tablet Take 0.5 mg by mouth daily as needed.    Yes Historical Provider, MD  lovastatin (MEVACOR) 20 MG tablet Take 20 mg by mouth at bedtime.    Yes Historical Provider, MD  montelukast (SINGULAIR) 10 MG tablet Take 10 mg by mouth at bedtime.    Yes Historical Provider, MD  NIFEdipine (NIFEDICAL XL) 60 MG 24 hr tablet Take 60 mg by mouth daily.   Yes Historical Provider, MD  traMADol (ULTRAM) 50 MG tablet Take 50 mg by mouth 2 (two) times daily as needed. Maximum  dose= 8 tablets per day For Pain   Yes Historical Provider, MD  nitroGLYCERIN (NITROSTAT) 0.4 MG SL tablet Place 0.4 mg under the tongue every 5 (five) minutes as needed. For chest pain    Historical Provider, MD    Current Facility-Administered Medications  Medication Dose Route Frequency Provider Last Rate Last Dose  . 0.9 %  sodium chloride infusion  100 mL Intravenous PRN Garnetta Buddy, MD      . 0.9 %  sodium chloride infusion  100 mL Intravenous PRN Garnetta Buddy, MD      . alteplase (CATHFLO ACTIVASE) injection 2 mg  2 mg Intracatheter Once PRN Garnetta Buddy, MD      . alum & mag hydroxide-simeth (MAALOX/MYLANTA) 200-200-20 MG/5ML suspension 30 mL  30 mL Oral Q6H PRN Ricki Rodriguez, MD      . aspirin EC tablet 81 mg  81 mg Oral Daily Ricki Rodriguez, MD      . cloNIDine (CATAPRES) tablet 0.2 mg  0.2 mg Oral BID Ricki Rodriguez, MD      . clopidogrel (PLAVIX) tablet 75 mg  75 mg Oral Daily Ricki Rodriguez, MD      . dextrose 50 % solution 50 mL  1 ampule Intravenous Once Ethelda Chick, MD   50 mL at 12/30/11 1230  . docusate sodium (COLACE) capsule 100 mg  100 mg Oral BID Ricki Rodriguez, MD      . feeding supplement (NEPRO CARB STEADY) liquid 237 mL  237 mL Oral PRN Garnetta Buddy, MD      . furosemide (LASIX) tablet 40 mg  40 mg Oral BID Ricki Rodriguez, MD      . insulin aspart (novoLOG) injection 10 Units  10 Units Intravenous Once Ethelda Chick, MD   10 Units at 12/30/11 1235  . iohexol (OMNIPAQUE) 350 MG/ML injection 100 mL  100 mL Intravenous Once PRN Medication Radiologist, MD   100 mL at 12/30/11 1212  . iohexol (OMNIPAQUE) 350 MG/ML injection 80 mL  80 mL Intravenous Once PRN Medication Radiologist, MD   80 mL at 12/30/11 1212  . isosorbide mononitrate (IMDUR) 24 hr tablet 60 mg  60 mg Oral Daily Ricki Rodriguez, MD      . lidocaine (XYLOCAINE) 1 % injection 5 mL  5 mL Intradermal PRN Garnetta Buddy, MD      . lidocaine-prilocaine (EMLA) cream 1 application  1 application Topical PRN Garnetta Buddy, MD      . lisinopril (PRINIVIL,ZESTRIL) tablet 20 mg  20 mg Oral QPM Ricki Rodriguez, MD      . LORazepam (ATIVAN) tablet 0.5 mg  0.5 mg Oral Daily PRN Ricki Rodriguez, MD      . montelukast (SINGULAIR) tablet 10 mg  10 mg Oral QHS Ricki Rodriguez, MD      . NIFEdipine (PROCARDIA-XL/ADALAT CC) 24 hr tablet 60 mg  60 mg Oral Daily Ricki Rodriguez, MD      . nitroGLYCERIN (NITROSTAT) SL tablet 0.4 mg  0.4 mg Sublingual Q5 min PRN Ricki Rodriguez, MD      . ondansetron (ZOFRAN) tablet 4 mg  4 mg Oral Q6H PRN Ricki Rodriguez, MD       Or  . ondansetron (ZOFRAN) injection 4 mg  4 mg Intravenous Q6H PRN Ricki Rodriguez, MD      . oxyCODONE (Oxy IR/ROXICODONE) immediate release tablet 5 mg  5 mg  Oral Q4H PRN Ricki Rodriguez, MD      . pentafluoroprop-tetrafluoroeth Peggye Pitt) aerosol 1 application  1 application Topical PRN  Garnetta Buddy, MD      . Renal Multivitamin Formula TABS 1 tablet  1 tablet Oral Daily Ricki Rodriguez, MD      . simvastatin (ZOCOR) tablet 10 mg  10 mg Oral q1800 Ricki Rodriguez, MD      . sodium chloride 0.9 % injection 3 mL  3 mL Intravenous Q12H Ricki Rodriguez, MD      . sodium polystyrene (KAYEXALATE) 15 GM/60ML suspension 45 g  45 g Oral Once Ethelda Chick, MD   45 g at 12/30/11 1235  . DISCONTD: [place] Caps CAPS 1 mg  1 capsule Oral Daily Ricki Rodriguez, MD        Allergies as of 12/30/2011 - Review Complete 12/30/2011  Allergen Reaction Noted  . Caffeine  12/30/2011  . Ibuprofen Other (See Comments) 08/14/2011  . Pork-derived products  12/30/2011  . Shellfish allergy  12/30/2011  . Tarka (trandolapril-verapamil hcl er) Cough 05/24/2011    Family History  Problem Relation Age of Onset  . Heart disease Mother   . Hypertension Mother   . Heart disease Father   . COPD Father   . Hypertension Sister   . Heart disease Sister   . Anesthesia problems Neg Hx     History   Social History  . Marital Status: Married    Spouse Name: N/A    Number of Children: N/A  . Years of Education: N/A   Occupational History  . Not on file.   Social History Main Topics  . Smoking status: Former Smoker -- 0 years    Quit date: 05/31/1963  . Smokeless tobacco: Not on file  . Alcohol Use: No  . Drug Use: No  . Sexually Active: Not on file   Other Topics Concern  . Not on file   Social History Narrative  . No narrative on file    Review of Systems: Gen: chronically Ill HEENT: No visual complaints, No history of Retinopathy. Normal external appearance No Epistaxis or Sore throat. No sinusitis.   CV: Denies chest pain, angina, palpitations, syncope, orthopnea, PND, peripheral edema, and claudication. Resp:Lung mass with Chronic effusion GI: Denies vomiting blood, jaundice, and fecal incontinence.   Denies dysphagia or odynophagia. GU : Denies urinary burning, blood in urine,  urinary frequency, urinary hesitancy, nocturnal urination, and urinary incontinence.  No renal calculi. MS: Denies joint pain, limitation of movement, and swelling, stiffness, low back pain, extremity pain. Denies muscle weakness, cramps, atrophy.  No use of non steroidal antiinflammatory drugs. Derm: Denies rash, itching, dry skin, hives, moles, warts, or unhealing ulcers.  Psych: Denies depression, anxiety, memory loss, suicidal ideation, hallucinations, paranoia, and confusion. Heme: Denies bruising, bleeding, and enlarged lymph nodes. Neuro: No headache.  No diplopia. No dysarthria.  No dysphasia.  No history of CVA.  No Seizures. No paresthesias.  No weakness. Endocrine No DM.  No Thyroid disease.  No Adrenal disease.  Physical Exam: Vital signs in last 24 hours: Temp:  [97.4 F (36.3 C)-98.1 F (36.7 C)] 98.1 F (36.7 C) (06/10 1900) Pulse Rate:  [61-93] 89  (06/10 1905) Resp:  [14-28] 28  (06/10 1905) BP: (149-189)/(79-105) 150/86 mmHg (06/10 1905) SpO2:  [88 %-100 %] 96 % (06/10 1855) FiO2 (%):  [100 %] 100 % (06/10 1709) Weight:  [83.4 kg (183 lb 13.8 oz)-84.6 kg (186 lb  8.2 oz)] 84.6 kg (186 lb 8.2 oz) (06/10 1841) Last BM Date: 12/30/11 General:   Chronically Ill Head:  Normocephalic and atraumatic. Eyes:  Sclera clear, no icterus.   Conjunctiva pink. Ears:  Normal auditory acuity. Nose:  No deformity, discharge,  or lesions. Mouth:  No deformity or lesions, dentition normal. Neck:  Supple; no masses or thyromegaly. JVP not elevated Lungs:  Poor entry Heart:  Regular rate and rhythm; no murmurs, clicks, rubs,  or gallops. Abdomen:  Soft, nontender and nondistended. No masses, hepatosplenomegaly or hernias noted. Normal bowel sounds, without guarding, and without rebound.   Msk:  Symmetrical without gross deformities. Normal posture. Pulses:  No carotid, renal, femoral bruits. DP and PT symmetrical and equal Extremities:  Without clubbing or edema. Neurologic:  Alert and   oriented x4;  grossly normal neurologically. Skin:  Intact without significant lesions or rashes. Cervical Nodes:  No significant cervical adenopathy. Psych:  Alert and cooperative. Normal mood and affect.  Intake/Output from previous day:   Intake/Output this shift:    Lab Results:  Basename 12/30/11 1031  WBC 8.5  HGB 6.5*  HCT 18.5*  PLT 214   BMET  Basename 12/30/11 1031  NA 133*  K 5.2*  CL 95*  CO2 26  GLUCOSE 94  BUN 56*  CREATININE 7.57*  CALCIUM 9.3  PHOS --   LFT No results found for this basename: PROT,ALBUMIN,AST,ALT,ALKPHOS,BILITOT,BILIDIR,IBILI in the last 72 hours PT/INR No results found for this basename: LABPROT:2,INR:2 in the last 72 hours Hepatitis Panel No results found for this basename: HEPBSAG,HCVAB,HEPAIGM,HEPBIGM in the last 72 hours  Studies/Results: Dg Chest 2 View  12/30/2011  *RADIOLOGY REPORT*  Clinical Data: Shortness of breath.  CHEST - 2 VIEW  Comparison: 06/06/2011  Findings: There is left base atelectasis or infiltrate with left pleural effusion.  Left pleural catheter is new in the interval. The cardiopericardial silhouette is enlarged.  Right lung is clear. Imaged bony structures of the thorax are intact.  Right IJ dialysis catheter tip projects in the mid right atrium.  IMPRESSION: Left base atelectasis/infiltrate with left pleural effusion.  Left pleural catheter is visualized.  Original Report Authenticated By: ERIC A. MANSELL, M.D.   Ct Angio Chest W/cm &/or Wo Cm  12/30/2011  *RADIOLOGY REPORT*  Clinical Data: Weakness and shortness of breath.  CT ANGIOGRAPHY CHEST  Technique:  Multidetector CT imaging of the chest using the standard protocol during bolus administration of intravenous contrast. Multiplanar reconstructed images including MIPs were obtained and reviewed to evaluate the vascular anatomy.  Contrast: OMNIPAQUE IOHEXOL 350 MG/ML SOLN, OMNIPAQUE IOHEXOL 350 MG/ML SOLN  Comparison: No comparison studies  available.  Findings: Initial contrast bolus resulted in poor opacification of the pulmonary arteries.  An additional 80cc bolus was administered and repeat imaging showed improved pulmonary artery opacification.  There is no filling defect within the opacified pulmonary arteries to suggest the presence of an acute pulmonary embolus.  Ascending thoracic aorta measures up to 4.7 cm in diameter, consistent with aneurysm.  There is no dissection of the thoracic aorta.  The heart is enlarged.  Right IJ dialysis catheter tip projects in the mid right atrium.  No pericardial effusion.  Lung windows reveal some emphysema.  Patchy airspace disease is seen in the right upper lobe centrally.  There is some compressive atelectasis or infiltrate in the posterior right lower lobe. Spiculated 14 mm pulmonary nodule is seen in the right lower lobe on image 82 of series 11.  4.7 cm  mass lesion is seen in the left lower lobe.  An adjacent 2.5 cm mass is evident.  There is some atelectasis or scarring in the posterior lingula.  A left-sided pleural drain is positioned posteriorly within the rim enhancing small to moderate left pleural fluid collection which could be a malignant effusion or empyema.  Bone windows reveal no worrisome lytic or sclerotic osseous lesions.  IMPRESSION: No CT evidence for acute pulmonary embolus.  Approximately 5 cm mass in the left lower lobe with other bilateral spiculated pulmonary nodules, worrisome for neoplastic disease in this patient with history of lung cancer.  Left posterior loculated fluid collection compatible with malignant effusion or empyema.  The pleural drain tracks up along the posterior chest wall within this collection.  Ascending aortic aneurysm.  Original Report Authenticated By: ERIC A. MANSELL, M.D.    Assessment/Plan:  ESRD-MWF dialysis patient  ANEMIA-acute drop in Hemoglobin Agree with transfusion and Aranesp  MBD-stable   HTN/VOL-controlled  ACCESS-AVF and catheter  Has been accessing fistula x3   LOS: 0 Rhema Boyett W @TODAY @7 :47 PM

## 2011-12-30 NOTE — ED Notes (Signed)
Patient transported to X-ray 

## 2011-12-30 NOTE — Progress Notes (Signed)
12/30/2011 patient transfer form the emergency room to 6700 at 1640. Patient is alert, oriented and ambulatory with assist. He uses a cane. Patient gait is unsteady and he is also weak. Patient skin is dry also the heels. He have a left upper arm fistula, right hemodialysis cath and left plurex drain. Bilateral lower extremities in ankles.Patient was place on telemetry. Biochemist, clinical.

## 2011-12-30 NOTE — ED Notes (Signed)
Pt does not eat pork, shellfish, catfish.

## 2011-12-30 NOTE — ED Provider Notes (Signed)
History     CSN: [id number]  Arrival date & time 12/30/11  0915   First MD Initiated Contact with Patient 12/30/11 0915      Chief Complaint  Patient presents with  . Weakness  . Shortness of Breath    (Consider location/radiation/quality/duration/timing/severity/associated sxs/prior treatment) HPI Pt presents with c/o fatigue and shortness of breath.  History obtained from patient and his family.  He has hx of ESRD on dialysis, has lung mass which he does not wish to have treated or evaluated further- also has pleurex catheter in left chest due to pleural effusion.  Daughter states he has been in [place] with her over the past several months and has been receiving dialysis there- she states he was found to be anemic recently in [place]- several weeks ago.  Was also receiving physical therapy and doing well- until 2-3 days ago became more sob and more fatigued.  No fever/chills, no cough.  No leg swelling.  Pleurex catheter has been draining normally and overall over the past few weeks has decreased in the amount of drainage.  Sob and fatigue are worse with minimal exertion.  There are no other alleviating or modifying factors.  He has not missed any dialysis sessions and is due today.  There are no other associated systemic symptoms.    Past Medical History  Diagnosis Date  . Hypertension   . CAD (coronary artery disease)   . Hyperlipidemia   . Gout   . Arthritis   . Anxiety   . Wears hearing aid   . Neuromuscular disorder     sciatica right leg  . Dialysis patient     M-W-F [address]  . Angina     takes imdur  . Chronic kidney disease     on Hemodialysis  . Cancer 2010    - monitoring- pt did not want tx  . Blood transfusion   . Myocardial infarction     X 4  . Dysrhythmia   . COPD (chronic obstructive pulmonary disease)   . Pneumonia 2009  . Anemia   . Renal insufficiency     Past Surgical History  Procedure Date  . Nose surgery     cauterized for  numerous bleeds  . Kidney surgery   . Insertion of dialysis catheter   . US echocardiography   . Av fistula placement 07/11/2011    Procedure: ARTERIOVENOUS (AV) FISTULA CREATION;  Surgeon: Juleen China, MD;  Location: [facility] OR;  Service: Vascular;  Laterality: Left;  . Coronary angioplasty with stent placement 2010  . Diagnostic laparoscopy     remove scar tissue  . Eye surgery     right cataract surgery  . Cataract extraction w/phaco 08/14/2011    Procedure: CATARACT EXTRACTION PHACO AND INTRAOCULAR LENS PLACEMENT (IOC);  Surgeon: Chalmers Guest, MD;  Location: [facility] OR;  Service: Ophthalmology;  Laterality: Left;    Family History  Problem Relation Age of Onset  . Heart disease Mother   . Hypertension Mother   . Heart disease Father   . COPD Father   . Hypertension Sister   . Heart disease Sister   . Anesthesia problems Neg Hx     History  Substance Use Topics  . Smoking status: Former Smoker -- 0 years    Quit date: 05/31/1963  . Smokeless tobacco: Not on file  . Alcohol Use: No      Review of Systems ROS reviewed and all otherwise negative except for mentioned in HPI  Allergies  Ibuprofen and Tarka  Home Medications   Current Outpatient Rx  Name Route Sig Dispense Refill  . ASPIRIN EC 81 MG PO TBEC Oral Take 81 mg by mouth at bedtime. Does not take on Sunday's    . RENO CAPS PO Oral Take 1 capsule by mouth every evening.    Marland Kitchen CLONIDINE HCL 0.2 MG PO TABS Oral Take 0.2 mg by mouth 2 (two) times daily.     Marland Kitchen CLOPIDOGREL BISULFATE 75 MG PO TABS Oral Take 75 mg by mouth daily.    . FUROSEMIDE 40 MG PO TABS Oral Take 40 mg by mouth 2 (two) times daily.     . ISOSORBIDE MONONITRATE ER 60 MG PO TB24 Oral Take 60 mg by mouth daily.     Marland Kitchen LISINOPRIL 20 MG PO TABS Oral Take 20 mg by mouth every evening.     Marland Kitchen LORAZEPAM 0.5 MG PO TABS Oral Take 0.5 mg by mouth daily as needed.     Marland Kitchen LOVASTATIN 20 MG PO TABS Oral Take 20 mg by mouth at bedtime.     Marland Kitchen MONTELUKAST SODIUM 10 MG  PO TABS Oral Take 10 mg by mouth at bedtime.     Marland Kitchen NIFEDIPINE ER OSMOTIC 60 MG PO TB24 Oral Take 60 mg by mouth daily.    . TRAMADOL HCL 50 MG PO TABS Oral Take 50 mg by mouth 2 (two) times daily as needed. Maximum dose= 8 tablets per day For Pain    . NITROGLYCERIN 0.4 MG SL SUBL Sublingual Place 0.4 mg under the tongue every 5 (five) minutes as needed. For chest pain      BP 185/96  Pulse 64  Temp(Src) 97.9 F (36.6 C) (Oral)  Resp 20  SpO2 100% Vitals reviewed Physical Exam Physical Examination: General appearance - alert, chronically ill appearing, and in no distress Mental status - alert, oriented to person, place, and time Eyes - muddy sclera, no icterus Mouth - mucous membranes moist, pharynx normal without lesions Chest - decreased breath sounds throughout both lung fields, pleurex catheter in left chest wall- site c/d/i Heart - normal rate, regular rhythm, normal S1, S2, no murmurs, rubs, clicks or gallops Abdomen - soft, nontender, nondistended, no masses or organomegaly Extremities - peripheral pulses normal, no pedal edema, no clubbing or cyanosis Skin - normal coloration and turgor, no rashes Psych- flat affect  ED Course  Procedures (including critical care time)   Date: 12/30/2011  Rate: 66  Rhythm: normal sinus rhythm  QRS Axis: normal  Intervals: normal  ST/T Wave abnormalities: normal  Conduction Disutrbances:none  Narrative Interpretation: q waves, age indeterminate anteroseptal infarct  Old EKG Reviewed: unchanged    1:51 PM discussed with Dr. Algie Coffer, he will see patient in the ED and discuss options with family.  I have had a [place] discussion with patient and the family about all the findings today in the ED.  They are agreeable with a blood transfusion if this is deemed necessary, however they do not want to proceed with any further workup of the lung mass.    Labs Reviewed  CBC - Abnormal; Notable for the following:    RBC 2.14 (*)    Hemoglobin  6.5 (*)    HCT 18.5 (*)    RDW 16.9 (*)    All other components within normal limits  BASIC METABOLIC PANEL - Abnormal; Notable for the following:    Sodium 133 (*)    Potassium 5.2 (*)  Chloride 95 (*)    BUN 56 (*)    Creatinine, Ser 7.57 (*)    GFR calc non Af Amer 6 (*)    GFR calc Af Amer 7 (*)    All other components within normal limits  PRO B NATRIURETIC PEPTIDE - Abnormal; Notable for the following:    Pro B Natriuretic peptide (BNP) 60860.0 (*)    All other components within normal limits  CARDIAC PANEL(CRET KIN+CKTOT+MB+TROPI)  TYPE AND SCREEN  ABO/RH   Dg Chest 2 View  12/30/2011  *RADIOLOGY REPORT*  Clinical Data: Shortness of breath.  CHEST - 2 VIEW  Comparison: 06/06/2011  Findings: There is left base atelectasis or infiltrate with left pleural effusion.  Left pleural catheter is new in the interval. The cardiopericardial silhouette is enlarged.  Right lung is clear. Imaged bony structures of the thorax are intact.  Right IJ dialysis catheter tip projects in the mid right atrium.  IMPRESSION: Left base atelectasis/infiltrate with left pleural effusion.  Left pleural catheter is visualized.  Original Report Authenticated By: ERIC A. MANSELL, M.D.   Ct Angio Chest W/cm &/or Wo Cm  12/30/2011  *RADIOLOGY REPORT*  Clinical Data: Weakness and shortness of breath.  CT ANGIOGRAPHY CHEST  Technique:  Multidetector CT imaging of the chest using the standard protocol during bolus administration of intravenous contrast. Multiplanar reconstructed images including MIPs were obtained and reviewed to evaluate the vascular anatomy.  Contrast: OMNIPAQUE IOHEXOL 350 MG/ML SOLN, OMNIPAQUE IOHEXOL 350 MG/ML SOLN  Comparison: No comparison studies available.  Findings: Initial contrast bolus resulted in poor opacification of the pulmonary arteries.  An additional 80cc bolus was administered and repeat imaging showed improved pulmonary artery opacification.  There is no filling defect  within the opacified pulmonary arteries to suggest the presence of an acute pulmonary embolus.  Ascending thoracic aorta measures up to 4.7 cm in diameter, consistent with aneurysm.  There is no dissection of the thoracic aorta.  The heart is enlarged.  Right IJ dialysis catheter tip projects in the mid right atrium.  No pericardial effusion.  Lung windows reveal some emphysema.  Patchy airspace disease is seen in the right upper lobe centrally.  There is some compressive atelectasis or infiltrate in the posterior right lower lobe. Spiculated 14 mm pulmonary nodule is seen in the right lower lobe on image 82 of series 11.  4.7 cm mass lesion is seen in the left lower lobe.  An adjacent 2.5 cm mass is evident.  There is some atelectasis or scarring in the posterior lingula.  A left-sided pleural drain is positioned posteriorly within the rim enhancing small to moderate left pleural fluid collection which could be a malignant effusion or empyema.  Bone windows reveal no worrisome lytic or sclerotic osseous lesions.  IMPRESSION: No CT evidence for acute pulmonary embolus.  Approximately 5 cm mass in the left lower lobe with other bilateral spiculated pulmonary nodules, worrisome for neoplastic disease in this patient with history of lung cancer.  Left posterior loculated fluid collection compatible with malignant effusion or empyema.  The pleural drain tracks up along the posterior chest wall within this collection.  Ascending aortic aneurysm.  Original Report Authenticated By: ERIC A. MANSELL, M.D.     1. Anemia   2. End stage renal disease on dialysis   3. Hyperkalemia   4. Lung mass   5. Pleural effusion   6. Shortness of breath   7. Fatigue       MDM  Pt  with multiple chronic medical problems presenting with c/o fatigue and shortness of breath.  Pt found to be anemic- family states this was found recently in [place] as well.  Chest CT shows no evidence of PE- but continues to show lung mass with  associated effusion.  Pleurex catheter appears to be in good condition.  Also mild hypokalemia which was treated.  I have discussed all results with patient and family.  They do not wish to know about lung mass and specifically ask that I not talk about it or use the word cancer.  Dr. Algie Coffer is familiar with this patient and will see him in the ED for evaluation, likely admission.  However, family and patient are considering whether they are agreeable with admission or not at this time.         Ethelda Chick, MD 12/30/11 [phone]

## 2011-12-30 NOTE — ED Notes (Signed)
Patient reports that he has been feeling weak and short of breath for the last 3 days, Patient is a dialysis patient and was supposed to report for treatment today. Patient also has a drainage tube to his left lung and is used for remember fluid off of that lung.

## 2011-12-31 DIAGNOSIS — J90 Pleural effusion, not elsewhere classified: Secondary | ICD-10-CM

## 2011-12-31 LAB — TYPE AND SCREEN
ABO/RH(D): O POS
Unit division: 0

## 2011-12-31 LAB — CBC
Hemoglobin: 7.8 g/dL — ABNORMAL LOW (ref 13.0–17.0)
MCH: 30.6 pg (ref 26.0–34.0)
MCHC: 35.8 g/dL (ref 30.0–36.0)
MCV: 85.5 fL (ref 78.0–100.0)

## 2011-12-31 LAB — GLUCOSE, CAPILLARY: Glucose-Capillary: 73 mg/dL (ref 70–99)

## 2011-12-31 LAB — BASIC METABOLIC PANEL
BUN: 23 mg/dL (ref 6–23)
CO2: 31 mEq/L (ref 19–32)
GFR calc non Af Amer: 11 mL/min — ABNORMAL LOW (ref >90)
Glucose, Bld: 91 mg/dL (ref 70–99)
Potassium: 4 mEq/L (ref 3.5–5.1)

## 2011-12-31 MED ORDER — RENAL MULTIVITAMIN FORMULA PO TABS: 1.0000 | ORAL_TABLET | Freq: Every day | ORAL | Status: DC

## 2011-12-31 MED ORDER — AMLODIPINE BESYLATE 5 MG PO TABS
5.0000 mg | ORAL_TABLET | Freq: Once | ORAL | Status: AC
  Administered 2012-01-01: 5 mg via ORAL
  Filled 2011-12-31: qty 1

## 2011-12-31 MED ORDER — RENA-VITE PO TABS
1.0000 | ORAL_TABLET | Freq: Every day | ORAL | Status: DC
  Filled 2011-12-31: qty 1

## 2011-12-31 MED ORDER — CALCIUM ACETATE 667 MG PO CAPS
667.0000 mg | ORAL_CAPSULE | Freq: Three times a day (TID) | ORAL | Status: DC
  Administered 2011-12-31 – 2012-01-01 (×4): 667 mg via ORAL
  Filled 2011-12-31 (×6): qty 1

## 2011-12-31 NOTE — Consult Note (Signed)
[address]            [place] [address]          [phone]       [name] [facility] Medical Record #[id number] Date of Birth: Jun 08, 1926  Referring: No ref. provider found Primary Care: Ricki Rodriguez, MD, MD  Chief Complaint:   Left Pleurx catheter with minimal drainage Chief Complaint  Patient presents with  . Weakness  . Shortness of Breath    History of Present Illness:     The patient is a elderly black male with a greater than two-year history of lung cancer left lower lobe who had a large left pleural effusion last month while he was visiting family in [place] and a Pleurx catheter was inserted by a pulmonologist at [facility] in [place]. By the family's report the initial drainage was 1 L for approximately 4 drainage sessions and then has tapered down to 500 300 100 and now 10-20 cc per drainage. The patient was recently admitted by Dr. Algie Coffer for some shortness of breath and a CT scan was performed which showed no pulmonary embolus. There was a large left lower lobe mass with atelectasis and associated small subpulmonic pleural effusion. At the Pleurx catheter is in good position in the lower posterior left pleural space. The patient's shortness of breath has improved with more aggressive hemodialysis.  On exam of the Pleurx catheter at the bedside today there is no drainage or signs of cellulitis. The sutures still intact.  By report the family states the pulmonologist at place the Pleurx catheter in [facility] proximally 6-8 weeks ago recommended removing the Pleurx catheter but the family wished return back home to [place]. The family has been draining the Pleurx catheter approximately once per week now with minimal to scant output. The patient's family has a full case of Pleurx drainage canisters and dressing supplies at home. They are taking care of the catheter themselves and apparently doing well.  The  family by report says the pleural fluid initially drained from the Pleurx catheter did not show malignancy.   Current Activity/ Functional Status: The patient ambulates with a walker-cane   Past Medical History  Diagnosis Date  . Hypertension   . CAD (coronary artery disease)   . Hyperlipidemia   . Gout   . Arthritis   . Anxiety   . Wears hearing aid   . Neuromuscular disorder     sciatica right leg  . Dialysis patient     M-W-F [address]  . Angina     takes imdur  . Chronic kidney disease     on Hemodialysis  . Cancer 2010    - monitoring- pt did not want tx  . Blood transfusion   . Myocardial infarction     X 4  . Dysrhythmia   . COPD (chronic obstructive pulmonary disease)   . Pneumonia 2009  . Anemia   . Renal insufficiency     Past Surgical History  Procedure Date  . Nose surgery     cauterized for numerous bleeds  . Kidney surgery   . Insertion of dialysis catheter   . US echocardiography   . Av fistula placement 07/11/2011    Procedure: ARTERIOVENOUS (AV) FISTULA CREATION;  Surgeon: Juleen China, MD;  Location: [facility] OR;  Service: Vascular;  Laterality: Left;  .  Coronary angioplasty with stent placement 2010  . Diagnostic laparoscopy     remove scar tissue  . Eye surgery     right cataract surgery  . Cataract extraction w/phaco 08/14/2011    Procedure: CATARACT EXTRACTION PHACO AND INTRAOCULAR LENS PLACEMENT (IOC);  Surgeon: Chalmers Guest, MD;  Location: [facility] OR;  Service: Ophthalmology;  Laterality: Left;    History  Smoking status  . Former Smoker -- 0 years  . Quit date: 05/31/1963  Smokeless tobacco  . Not on file    History  Alcohol Use No    History   Social History  . Marital Status: Married    Spouse Name: N/A    Number of Children: N/A  . Years of Education: N/A   Occupational History  . Not on file.   Social History Main Topics  . Smoking status: Former Smoker -- 0 years    Quit date: 05/31/1963  . Smokeless tobacco:  Not on file  . Alcohol Use: No  . Drug Use: No  . Sexually Active: Not on file   Other Topics Concern  . Not on file   Social History Narrative  . No narrative on file    Allergies  Allergen Reactions  . Caffeine     Seventh day advantist  . Ibuprofen Other (See Comments)    Messed up kidneys  . Pork-Derived Products     Seventh day advantist  . Shellfish Allergy     Seventh day advantist  . Tarka (Trandolapril-Verapamil Hcl Er) Cough    Current Facility-Administered Medications  Medication Dose Route Frequency Provider Last Rate Last Dose  . 0.9 %  sodium chloride infusion  100 mL Intravenous PRN Garnetta Buddy, MD      . 0.9 %  sodium chloride infusion  100 mL Intravenous PRN Garnetta Buddy, MD      . alteplase (CATHFLO ACTIVASE) injection 2 mg  2 mg Intracatheter Once PRN Garnetta Buddy, MD      . alum & mag hydroxide-simeth (MAALOX/MYLANTA) 200-200-20 MG/5ML suspension 30 mL  30 mL Oral Q6H PRN Ricki Rodriguez, MD   30 mL at 12/31/11 0120  . aspirin EC tablet 81 mg  81 mg Oral Daily Ricki Rodriguez, MD   81 mg at 12/31/11 1025  . calcium acetate (PHOSLO) capsule 667 mg  667 mg Oral TID WC Gerome Apley, PA   667 mg at 12/31/11 1655  . cloNIDine (CATAPRES) tablet 0.2 mg  0.2 mg Oral BID Ricki Rodriguez, MD   0.2 mg at 12/31/11 0701  . clopidogrel (PLAVIX) tablet 75 mg  75 mg Oral Daily Ricki Rodriguez, MD   75 mg at 12/31/11 1025  . docusate sodium (COLACE) capsule 100 mg  100 mg Oral BID Ricki Rodriguez, MD   100 mg at 12/31/11 1025  . feeding supplement (NEPRO CARB STEADY) liquid 237 mL  237 mL Oral PRN Garnetta Buddy, MD      . furosemide (LASIX) tablet 40 mg  40 mg Oral BID Ricki Rodriguez, MD   40 mg at 12/31/11 0704  . isosorbide mononitrate (IMDUR) 24 hr tablet 60 mg  60 mg Oral Daily Ricki Rodriguez, MD   60 mg at 12/31/11 1025  . lidocaine (XYLOCAINE) 1 % injection 5 mL  5 mL Intradermal PRN Garnetta Buddy, MD      . lidocaine-prilocaine (EMLA) cream 1 application  1  application Topical PRN Garnetta Buddy, MD      .  lisinopril (PRINIVIL,ZESTRIL) tablet 20 mg  20 mg Oral QPM Ricki Rodriguez, MD   20 mg at 12/31/11 1655  . LORazepam (ATIVAN) tablet 0.5 mg  0.5 mg Oral Daily PRN Ricki Rodriguez, MD   0.5 mg at 12/31/11 0125  . montelukast (SINGULAIR) tablet 10 mg  10 mg Oral QHS Ricki Rodriguez, MD   10 mg at 12/31/11 0118  . multivitamin (RENA-VIT) tablet 1 tablet  1 tablet Oral QHS Ricki Rodriguez, MD      . NIFEdipine (PROCARDIA-XL/ADALAT CC) 24 hr tablet 60 mg  60 mg Oral Daily Ricki Rodriguez, MD   60 mg at 12/31/11 0703  . nitroGLYCERIN (NITROSTAT) SL tablet 0.4 mg  0.4 mg Sublingual Q5 min PRN Ricki Rodriguez, MD      . ondansetron [facility]) tablet 4 mg  4 mg Oral Q6H PRN Ricki Rodriguez, MD       Or  . ondansetron (ZOFRAN) injection 4 mg  4 mg Intravenous Q6H PRN Ricki Rodriguez, MD      . oxyCODONE (Oxy IR/ROXICODONE) immediate release tablet 5 mg  5 mg Oral Q4H PRN Ricki Rodriguez, MD   5 mg at 12/31/11 0131  . pentafluoroprop-tetrafluoroeth (GEBAUERS) aerosol 1 application  1 application Topical PRN Garnetta Buddy, MD      . simvastatin (ZOCOR) tablet 10 mg  10 mg Oral q1800 Ricki Rodriguez, MD   10 mg at 12/31/11 1654  . sodium chloride 0.9 % injection 3 mL  3 mL Intravenous Q12H Ricki Rodriguez, MD   3 mL at 12/31/11 1025  . DISCONTD: Renal Multivitamin Formula TABS 1 tablet  1 tablet Oral Daily Ricki Rodriguez, MD   1 tablet at 12/31/11 1027  . DISCONTD: Renal Multivitamin Formula TABS 1 tablet  1 tablet Oral QHS Ricki Rodriguez, MD        Prescriptions prior to admission  Medication Sig Dispense Refill  . aspirin EC 81 MG tablet Take 81 mg by mouth at bedtime. Does not take on Sunday's      . B Complex-C-Folic Acid (RENO CAPS PO) Take 1 capsule by mouth every evening.      . cloNIDine (CATAPRES) 0.2 MG tablet Take 0.2 mg by mouth 2 (two) times daily.       . clopidogrel (PLAVIX) 75 MG tablet Take 75 mg by mouth daily.      . furosemide (LASIX) 40 MG tablet  Take 40 mg by mouth 2 (two) times daily.       . isosorbide mononitrate (IMDUR) 60 MG 24 hr tablet Take 60 mg by mouth daily.       Marland Kitchen lisinopril (PRINIVIL,ZESTRIL) 20 MG tablet Take 20 mg by mouth every evening.       Marland Kitchen LORazepam (ATIVAN) 0.5 MG tablet Take 0.5 mg by mouth daily as needed.       . lovastatin (MEVACOR) 20 MG tablet Take 20 mg by mouth at bedtime.       . montelukast (SINGULAIR) 10 MG tablet Take 10 mg by mouth at bedtime.       Marland Kitchen NIFEdipine (NIFEDICAL XL) 60 MG 24 hr tablet Take 60 mg by mouth daily.      . traMADol (ULTRAM) 50 MG tablet Take 50 mg by mouth 2 (two) times daily as needed. Maximum dose= 8 tablets per day For Pain      . nitroGLYCERIN (NITROSTAT) 0.4 MG SL tablet Place 0.4 mg under the tongue  every 5 (five) minutes as needed. For chest pain        Family History  Problem Relation Age of Onset  . Heart disease Mother   . Hypertension Mother   . Heart disease Father   . COPD Father   . Hypertension Sister   . Heart disease Sister   . Anesthesia problems Neg Hx      Review of Systems:     Cardiac Review of Systems: Y or N  Chest Pain [    ]  Resting SOB [   ] Exertional SOB  [  ]  Orthopnea [  ]   Pedal Edema [   ]    Palpitations [  ] Syncope  [  ]   Presyncope [   ]  General Review of Systems: [Y] = yes [  ]=no Constitional: recent weight change [  ]; an  Eye : blurred vision [  ]; diplopia [   ]; vision changes [  ];  Amaurosis fugax[  ]; Resp: cough [  ];  wheezing[  ];  hemoptysis[  ]; shortness of breath[  ]; paroxysmal nocturnal dyspnea[  ]; dyspnea on exertion[  ]; or orthopnea[  ];  GI:  gallstones[  ], vomiting[  ];  dysphagia[  ]; melena[  ];  hematochezia [  ]; heartburn[  ];   Hx of  Colonoscopy[  ]; GU: kidney stones [  ]; hematuria[  ];   dysuria [  ];  nocturia[  ];  history of     obstruction [  ];             Skin: rash, swelling[  ];, hair loss[  ];  peripheral edema[  ];  or itching[  ]; Musculosketetal: myalgias[  ];  joint swelling[   ];  joint erythema[  ];  joint pain[  ];  back pain[  ];  Heme/Lymph: bruising[  ];  bleeding[  ];  anemia[  ];  Neuro: TIA[  ];  headaches[  ];  stroke[  ];  vertigo[  ];  seizures[  ];   paresthesias[  ];  difficulty walking[  ];  Psych:depression[  ]; anxiety[  ];  Endocrine: diabetes[  ];  thyroid dysfunction[  ];  Immunizations: Flu [  ]; Pneumococcal[  ];  Other:  Physical Exam: BP 166/90  Pulse 101  Temp(Src) 97.4 F (36.3 C) (Oral)  Resp 17  Ht 6\' 3"  (1.905 m)  Wt 178 lb 2.1 oz (80.8 kg)  BMI 22.26 kg/m2  SpO2 100%  Exam Gen. chronically ill but alert and responsive Thorax breath sounds fairly clear bilaterally Cardiac regular rhythm Abdomen nontender Neurologic nonfocal   Diagnostic Studies & Laboratory data:     Recent Radiology Findings:   Dg Chest 2 View  12/30/2011  *RADIOLOGY REPORT*  Clinical Data: Shortness of breath.  CHEST - 2 VIEW  Comparison: 06/06/2011  Findings: There is left base atelectasis or infiltrate with left pleural effusion.  Left pleural catheter is new in the interval. The cardiopericardial silhouette is enlarged.  Right lung is clear. Imaged bony structures of the thorax are intact.  Right IJ dialysis catheter tip projects in the mid right atrium.  IMPRESSION: Left base atelectasis/infiltrate with left pleural effusion.  Left pleural catheter is visualized.  Original Report Authenticated By: ERIC A. MANSELL, M.D.   Ct Angio Chest W/cm &/or Wo Cm  12/30/2011  *RADIOLOGY REPORT*  Clinical Data: Weakness and shortness of breath.  CT ANGIOGRAPHY  CHEST  Technique:  Multidetector CT imaging of the chest using the standard protocol during bolus administration of intravenous contrast. Multiplanar reconstructed images including MIPs were obtained and reviewed to evaluate the vascular anatomy.  Contrast: OMNIPAQUE IOHEXOL 350 MG/ML SOLN, OMNIPAQUE IOHEXOL 350 MG/ML SOLN  Comparison: No comparison studies available.  Findings: Initial contrast  bolus resulted in poor opacification of the pulmonary arteries.  An additional 80cc bolus was administered and repeat imaging showed improved pulmonary artery opacification.  There is no filling defect within the opacified pulmonary arteries to suggest the presence of an acute pulmonary embolus.  Ascending thoracic aorta measures up to 4.7 cm in diameter, consistent with aneurysm.  There is no dissection of the thoracic aorta.  The heart is enlarged.  Right IJ dialysis catheter tip projects in the mid right atrium.  No pericardial effusion.  Lung windows reveal some emphysema.  Patchy airspace disease is seen in the right upper lobe centrally.  There is some compressive atelectasis or infiltrate in the posterior right lower lobe. Spiculated 14 mm pulmonary nodule is seen in the right lower lobe on image 82 of series 11.  4.7 cm mass lesion is seen in the left lower lobe.  An adjacent 2.5 cm mass is evident.  There is some atelectasis or scarring in the posterior lingula.  A left-sided pleural drain is positioned posteriorly within the rim enhancing small to moderate left pleural fluid collection which could be a malignant effusion or empyema.  Bone windows reveal no worrisome lytic or sclerotic osseous lesions.  IMPRESSION: No CT evidence for acute pulmonary embolus.  Approximately 5 cm mass in the left lower lobe with other bilateral spiculated pulmonary nodules, worrisome for neoplastic disease in this patient with history of lung cancer.  Left posterior loculated fluid collection compatible with malignant effusion or empyema.  The pleural drain tracks up along the posterior chest wall within this collection.  Ascending aortic aneurysm.  Original Report Authenticated By: ERIC A. MANSELL, M.D.      Recent Lab Findings:     Assessment / Plan:     The patient's Pleurx catheter is in good position by recent CT scan. There is not a significant pleural effusion remaining. The patient has a significant mass  consistent with bronchogenic cancer and associated atelectasis of left lower lobe. Apparently the family has decided not to pursue any therapy of this with the decision being made over 2 years ago.  I'll recommend to left family continue to care for the Pleurx catheter. I've given him my office phone number for an appointment if you would like to have [place] remove the catheter while he is in [place]. The patient may decide to go back to the pulmonary medicine physician in [place] who inserted a catheter to have it removed as well.       @[email or web address] @ 12/31/2011 7:26 PM

## 2011-12-31 NOTE — Progress Notes (Signed)
I have seen and examined this patient and agree with the plan of care. Elvis Coil W 12/31/2011, 11:42 AM

## 2011-12-31 NOTE — Progress Notes (Signed)
Subjective:  Feeling better. Wants pleural drainage tube left in per instructions from doctor in [place], [place].  Objective:  Vital Signs in the last 24 hours: Temp:  [97.4 F (36.3 C)-99 F (37.2 C)] 98.3 F (36.8 C) (06/11 0900) Pulse Rate:  [61-93] 75  (06/11 0900) Cardiac Rhythm:  [-] Normal sinus rhythm (06/10 2341) Resp:  [11-28] 17  (06/11 0900) BP: (149-199)/(84-105) 159/84 mmHg (06/11 0900) SpO2:  [88 %-100 %] 100 % (06/11 0900) FiO2 (%):  [100 %] 100 % (06/10 1709) Weight:  [80.8 kg (178 lb 2.1 oz)-84.6 kg (186 lb 8.2 oz)] 80.8 kg (178 lb 2.1 oz) (06/10 2346)  Physical Exam: BP Readings from Last 1 Encounters:  12/31/11 159/84    Wt Readings from Last 1 Encounters:  12/30/11 80.8 kg (178 lb 2.1 oz)    Weight change:   HEENT: [place]/AT, Eyes-Brown, PERL, EOMI, Conjunctiva-Pale, Sclera-muddy Neck: No JVD, No bruit, Trachea midline. Lungs:  Clear, Bilateral. Cardiac:  Regular rhythm, normal S1 and S2, no S3.  Abdomen:  Soft, non-tender. Extremities:  No edema present. No cyanosis. No clubbing. CNS: AxOx3, Cranial nerves grossly intact, moves all 4 extremities. Right handed. Skin: Warm and dry.   Intake/Output from previous day: 06/10 0701 - 06/11 0700 In: 971 [P.O.:420; Blood:551] Out: 3510 [Urine:10]    Lab Results: BMET    Component Value Date/Time   NA 137 12/31/2011 0500   K 4.0 12/31/2011 0500   CL 97 12/31/2011 0500   CO2 31 12/31/2011 0500   GLUCOSE 91 12/31/2011 0500   BUN 23 12/31/2011 0500   CREATININE 4.44* 12/31/2011 0500   CALCIUM 8.9 12/31/2011 0500   GFRNONAA 11* 12/31/2011 0500   GFRAA 13* 12/31/2011 0500   CBC    Component Value Date/Time   WBC 7.2 12/31/2011 0500   RBC 2.55* 12/31/2011 0500   HGB 7.8* 12/31/2011 0500   HCT 21.8* 12/31/2011 0500   PLT 191 12/31/2011 0500   MCV 85.5 12/31/2011 0500   MCH 30.6 12/31/2011 0500   MCHC 35.8 12/31/2011 0500   RDW 16.8* 12/31/2011 0500   LYMPHSABS 1.1 07/11/2011 0921   MONOABS 0.7 07/11/2011 0921   EOSABS 0.3 07/11/2011 0921   BASOSABS 0.0 07/11/2011 0921   CARDIAC ENZYMES Lab Results  Component Value Date   CKTOTAL 140 12/30/2011   CKMB 2.8 12/30/2011   TROPONINI <0.30 12/30/2011    Assessment/Plan:  Patient Active Hospital Problem List: Shortness of breath (12/30/2011) Improved Anemia associated with chronic renal failure (12/30/2011) Stable with transfusion Hypertension (12/30/2011) End stage renal disease (05/31/2011) Per nephrology  Surgical consult for pleural drainage Home soon   LOS: 1 day    Orpah Cobb  MD  12/31/2011, 9:44 AM

## 2011-12-31 NOTE — Progress Notes (Signed)
Subjective:  No current complaints, breathing well since HD last night; staying with daughter in [place], [place] since March.  Objective: Vital signs in last 24 hours: Temp:  [97.4 F (36.3 C)-99 F (37.2 C)] 97.5 F (36.4 C) (06/11 0530) Pulse Rate:  [61-93] 75  (06/11 0530) Resp:  [11-28] 20  (06/11 0530) BP: (149-199)/(79-105) 181/96 mmHg (06/11 0530) SpO2:  [88 %-100 %] 100 % (06/11 0530) FiO2 (%):  [100 %] 100 % (06/10 1709) Weight:  [80.8 kg (178 lb 2.1 oz)-84.6 kg (186 lb 8.2 oz)] 80.8 kg (178 lb 2.1 oz) (06/10 2346) Weight change:   Intake/Output from previous day: 06/10 0701 - 06/11 0700 In: 971 [P.O.:420; Blood:551] Out: 3510 [Urine:10]   EXAM: General appearance:  Alert, in no apparent distress Resp:  CTA bilaterally Cardio:  RRR with Gr II/VI systolic murmur GI:  + BS, soft and nontender Extremities:  No edema Access:  AVF @ LUA with + bruit, still with Right IJ catheter  Lab Results:  Basename 12/31/11 0500 12/30/11 1031  WBC 7.2 8.5  HGB 7.8* 6.5*  HCT 21.8* 18.5*  PLT 191 214   BMET:  Basename 12/31/11 0500 12/30/11 1031  NA 137 133*  K 4.0 5.2*  CL 97 95*  CO2 31 26  GLUCOSE 91 94  BUN 23 56*  CREATININE 4.44* 7.57*  CALCIUM 8.9 9.3  ALBUMIN -- --   No results found for this basename: PTH:2 in the last 72 hours Iron Studies: No results found for this basename: IRON,TIBC,TRANSFERRIN,FERRITIN in the last 72 hours  Assessment/Plan: 1. Dyspnea - Breathing much better since HD last night; chest x-ray with 5-cm mass in LLL with left posterior loculated fluid collection compatible with malignant effusion or empyema; s/p chest [place] placement in [place] for chronic pulmonary effusion. 2. History of lung cancer - previously refused workup for surgery/radiation. 3. ESRD - HD on MWF at [facility] prior to visiting daughter in [place]. Since March; s/p HD last night, K 5.2.  Next HD tomorrow. 4. HTN/Volume - BP elevated at 181/96 on Clonidine 0.2 mg bid, Lisinopril 20  mg qhs, and Nifedipine 60 mg qd; volume stable s/p HD yesterday with net UF 3.5 L. 5. Anemia - Hgb 7.8 s/p transfusion yesterday.  Aranesp 200 mcg with HD tomorrow. 6. Secondary hyperparathyroidism - Ca 8.9, currently no Zemplar, pt states he was using Tums as binder.  Phsolo 1 with meals.   LOS: 1 day   Jadie Allington 12/31/2011,7:57 AM

## 2011-12-31 NOTE — Progress Notes (Signed)
Utilization review completed.  

## 2011-12-31 NOTE — Progress Notes (Signed)
MD notified that pt bp 204/102 Clonidine 0.2 mg po given as scheduled bp reassesed 189/100 hr 82 pt is asymptomatic new orders received. Ilean Skill LPN

## 2012-01-01 LAB — RENAL FUNCTION PANEL
Albumin: 2.7 g/dL — ABNORMAL LOW (ref 3.5–5.2)
BUN: 38 mg/dL — ABNORMAL HIGH (ref 6–23)
Phosphorus: 3.6 mg/dL (ref 2.3–4.6)
Potassium: 3.1 mEq/L — ABNORMAL LOW (ref 3.5–5.1)
Sodium: 135 mEq/L (ref 135–145)

## 2012-01-01 LAB — CBC
HCT: 23.6 % — ABNORMAL LOW (ref 39.0–52.0)
MCHC: 34.7 g/dL (ref 30.0–36.0)
Platelets: 188 10*3/uL (ref 150–400)
RDW: 16.5 % — ABNORMAL HIGH (ref 11.5–15.5)

## 2012-01-01 MED ORDER — LABETALOL HCL 5 MG/ML IV SOLN
10.0000 mg | Freq: Once | INTRAVENOUS | Status: AC
  Administered 2012-01-01: 20 mg via INTRAVENOUS
  Filled 2012-01-01: qty 4

## 2012-01-01 NOTE — Progress Notes (Signed)
I have seen and examined this patient and agree with the plan of care .  [name] W 01/01/2012, 8:34 AM

## 2012-01-01 NOTE — Progress Notes (Signed)
Subjective:   Trying to cough up phlegm, but no difficulty breathing; comfortable, hoping for dc today.  Objective: Vital signs in last 24 hours: Temp:  [97.1 F (36.2 C)-98.3 F (36.8 C)] 97.1 F (36.2 C) (06/12 0510) Pulse Rate:  [75-101] 84  (06/12 0510) Resp:  [17-18] 18  (06/12 0510) BP: (159-204)/(84-102) 180/89 mmHg (06/12 0510) SpO2:  [94 %-100 %] 97 % (06/12 0510) Weight change:   Intake/Output from previous day: 06/11 0701 - 06/12 0700 In: 680 [P.O.:680] Out: 50 [Urine:50]   EXAM: General appearance: Alert, in no apparent distress Resp:  CTA bilaterally Cardio: GI: RRR with Gr I/VI systolic murmur Extremities:  No edema Access:  AVF @ LUA with + bruit, still with right IJ catheter  Lab Results:  Basename 12/31/11 0500 12/30/11 1031  WBC 7.2 8.5  HGB 7.8* 6.5*  HCT 21.8* 18.5*  PLT 191 214   BMET:  Basename 12/31/11 0500 12/30/11 1031  NA 137 133*  K 4.0 5.2*  CL 97 95*  CO2 31 26  GLUCOSE 91 94  BUN 23 56*  CREATININE 4.44* 7.57*  CALCIUM 8.9 9.3  ALBUMIN -- --   No results found for this basename: PTH:2 in the last 72 hours Iron Studies: No results found for this basename: IRON,TIBC,TRANSFERRIN,FERRITIN in the last 72 hours  Assessment/Plan: 1. Dyspnea - Breathing much better since HD on 6/10; chest x-ray with 5-cm mass in LLL with left posterior loculated fluid collection compatible with malignant effusion or empyema; [place] chest [place] in [place] for chronic pulmonary effusion. 2. History of lung cancer - previously refused workup for surgery/radiation. 3. ESRD - HD on MWF at [facility] prior to visiting daughter in [place]. since March, K 4 yesterday.  HD pending today. 4. HTN/Volume - BP elevated at 180/89 on Clonidine 0.2 mg bid, Lisinopril 20 mg qhs, and Nifedipine 60 mg qd; volume stable.  UF goal of 3 L today with HD. 5. Anemia - Hgb 7.8 s/p transfusion 6/10. Aranesp 200 mcg with HD today. 6. Secondary hyperparathyroidism - Ca 8.9, currently no  Zemplar, pt states he was using Tums as binder. Phoslo 1 with meals.   LOS: 2 days   Chanti Golubski 01/01/2012,7:12 AM

## 2012-01-01 NOTE — Progress Notes (Signed)
Patient blood pressure elevated during hemodialysis. Pt given 1000 am dose of clonidine 0.2 mg at 1144am for blood pressure of 212/114. After an hour there was no change(decrease in blood pressure) blood pressure reading of 207/104. 1000 am dose of Procardia 60mg  given at 1300 to decrease blood pressure. Gerome Apley PA is aware and has ordered these am blood pressure medications to be given on hemdialysis.  Blood pressure has been checked numerous times with no change. At 1540 blood pressure is  212/120. Dr Algie Coffer has ordered po Amlodipine 5mg  if there is no improvement.

## 2012-01-01 NOTE — Discharge Summary (Signed)
Physician Discharge Summary  Patient ID: [name] MRN: [id number] DOB/AGE: 02/26/1926 77 y.o.  Admit date: 12/30/2011 Discharge date: 01/01/2012  Admission Diagnoses: Shortness of breath Anemia of chronic renal failure Hypertension End stage renal disease Discharge Diagnoses:  Principal Problem:  *Shortness of breath Active Problems:  Anemia associated with chronic renal failure  Hypertension  End stage renal disease   Discharged Condition: good  Hospital Course: 78 years old black male with ESRD has unknown lung cancer. He had left plural drainage tube for chronic pleural effusion. His Hgb was down to 6.5. Post PRBC transfusion of 2 units, Hgb came up to 8.2. Post dialysis he felt better and was discharged home.  Once again patient and family refused lung mass evaluation.  Consults: nephrology  Significant Diagnostic Studies: labs: Hgb-6.5 to 8.2. Normal WBC and platelets count.   Treatments: IV hydration, Hemodialysis and Transfusion.  Discharge Exam: Blood pressure 214/120, pulse 73, temperature 98.3 F (36.8 C), temperature source Oral, resp. rate 20, height 6\' 3"  (1.905 m), weight 80.8 kg (178 lb 2.1 oz), SpO2 97.00%. HEENT: [place]/AT. Brown eyes, conjunctiva-pale, Sclera- muddy. Wears reading glasses and partial upper dentures.  Neck: No JVD, supple.  Lungs-Few rhonchi, bilaterally. Left chest tube in place.  CVS: Normal S1, S2. II/VI systolic murmur..  Abdomen: Soft  Extremities: Trace edema. No cyanosis or clubbing.  CNS; AXOX3. Moves all 4 ext.   Disposition: 01-Home or Self Care   Medication List  As of 01/01/2012  2:30 PM   ASK your doctor about these medications         aspirin EC 81 MG tablet   Take 81 mg by mouth at bedtime. Does not take on Sunday's      cloNIDine 0.2 MG tablet   Commonly known as: CATAPRES   Take 0.2 mg by mouth 2 (two) times daily.      clopidogrel 75 MG tablet   Commonly known as: PLAVIX   Take 75 mg by mouth daily.     furosemide 40 MG tablet   Commonly known as: LASIX   Take 40 mg by mouth 2 (two) times daily.      isosorbide mononitrate 60 MG 24 hr tablet   Commonly known as: IMDUR   Take 60 mg by mouth daily.      lisinopril 20 MG tablet   Commonly known as: PRINIVIL,ZESTRIL   Take 20 mg by mouth every evening.      LORazepam 0.5 MG tablet   Commonly known as: ATIVAN   Take 0.5 mg by mouth daily as needed.      lovastatin 20 MG tablet   Commonly known as: MEVACOR   Take 20 mg by mouth at bedtime.      montelukast 10 MG tablet   Commonly known as: SINGULAIR   Take 10 mg by mouth at bedtime.      NIFEDICAL XL 60 MG 24 hr tablet   Generic drug: NIFEdipine   Take 60 mg by mouth daily.      nitroGLYCERIN 0.4 MG SL tablet   Commonly known as: NITROSTAT   Place 0.4 mg under the tongue every 5 (five) minutes as needed. For chest pain      RENO CAPS PO   Take 1 capsule by mouth every evening.      traMADol 50 MG tablet   Commonly known as: ULTRAM   Take 50 mg by mouth 2 (two) times daily as needed. Maximum dose= 8 tablets per day  For  Pain             Signed: Chesney Suares S 01/01/2012, 2:30 PM

## 2012-01-01 NOTE — Progress Notes (Signed)
Patient blood pressure assessed before writing prn order for amlodipine, blood pressure of 186/99. Patient will have Imdur given once he is sent to 6700. Amlodipine not given. Blood pressure improved/ Imdur given at 1700

## 2012-01-01 NOTE — Procedures (Signed)
I have seen and examined this patient and agree with the plan of care seen on dialysis  Murphy Watson [facility] W 01/01/2012, 8:35 AM

## 2012-01-20 ENCOUNTER — Emergency Department (HOSPITAL_COMMUNITY): Payer: Medicare Other

## 2012-01-20 ENCOUNTER — Encounter (HOSPITAL_COMMUNITY): Payer: Self-pay | Admitting: Emergency Medicine

## 2012-01-20 ENCOUNTER — Emergency Department (HOSPITAL_COMMUNITY)
Admission: EM | Admit: 2012-01-20 | Discharge: 2012-01-20 | Disposition: A | Discharge status: Home / Self Care | Payer: Medicare Other | Attending: Emergency Medicine | Admitting: Emergency Medicine

## 2012-01-20 DIAGNOSIS — Z79899 Other long term (current) drug therapy: Secondary | ICD-10-CM | POA: Insufficient documentation

## 2012-01-20 DIAGNOSIS — R0600 Dyspnea, unspecified: Secondary | ICD-10-CM

## 2012-01-20 DIAGNOSIS — N186 End stage renal disease: Secondary | ICD-10-CM | POA: Insufficient documentation

## 2012-01-20 DIAGNOSIS — R222 Localized swelling, mass and lump, trunk: Secondary | ICD-10-CM | POA: Insufficient documentation

## 2012-01-20 DIAGNOSIS — R5381 Other malaise: Secondary | ICD-10-CM | POA: Insufficient documentation

## 2012-01-20 DIAGNOSIS — J4489 Other specified chronic obstructive pulmonary disease: Secondary | ICD-10-CM | POA: Insufficient documentation

## 2012-01-20 DIAGNOSIS — E785 Hyperlipidemia, unspecified: Secondary | ICD-10-CM | POA: Insufficient documentation

## 2012-01-20 DIAGNOSIS — J449 Chronic obstructive pulmonary disease, unspecified: Secondary | ICD-10-CM | POA: Insufficient documentation

## 2012-01-20 DIAGNOSIS — D649 Anemia, unspecified: Secondary | ICD-10-CM | POA: Insufficient documentation

## 2012-01-20 DIAGNOSIS — R5383 Other fatigue: Secondary | ICD-10-CM | POA: Insufficient documentation

## 2012-01-20 DIAGNOSIS — Z87891 Personal history of nicotine dependence: Secondary | ICD-10-CM | POA: Insufficient documentation

## 2012-01-20 DIAGNOSIS — R0602 Shortness of breath: Secondary | ICD-10-CM | POA: Insufficient documentation

## 2012-01-20 DIAGNOSIS — R609 Edema, unspecified: Secondary | ICD-10-CM | POA: Insufficient documentation

## 2012-01-20 DIAGNOSIS — I252 Old myocardial infarction: Secondary | ICD-10-CM | POA: Insufficient documentation

## 2012-01-20 DIAGNOSIS — I120 Hypertensive chronic kidney disease with stage 5 chronic kidney disease or end stage renal disease: Secondary | ICD-10-CM | POA: Insufficient documentation

## 2012-01-20 DIAGNOSIS — Z992 Dependence on renal dialysis: Secondary | ICD-10-CM | POA: Insufficient documentation

## 2012-01-20 LAB — POCT I-STAT, CHEM 8
BUN: 58 mg/dL — ABNORMAL HIGH (ref 6–23)
Chloride: 99 mEq/L (ref 96–112)
Creatinine, Ser: 7.4 mg/dL — ABNORMAL HIGH (ref 0.50–1.35)
Glucose, Bld: 108 mg/dL — ABNORMAL HIGH (ref 70–99)
Hemoglobin: 9.9 g/dL — ABNORMAL LOW (ref 13.0–17.0)
Potassium: 4.3 mEq/L (ref 3.5–5.1)
Sodium: 133 mEq/L — ABNORMAL LOW (ref 135–145)

## 2012-01-20 LAB — CBC WITH DIFFERENTIAL/PLATELET
Basophils Absolute: 0 10*3/uL (ref 0.0–0.1)
Eosinophils Absolute: 0.4 10*3/uL (ref 0.0–0.7)
Eosinophils Relative: 4 % (ref 0–5)
Lymphocytes Relative: 6 % — ABNORMAL LOW (ref 12–46)
MCV: 84.7 fL (ref 78.0–100.0)
Platelets: 249 10*3/uL (ref 150–400)
RDW: 16 % — ABNORMAL HIGH (ref 11.5–15.5)
WBC: 9.8 10*3/uL (ref 4.0–10.5)

## 2012-01-20 LAB — PROTIME-INR
INR: 1.26 (ref 0.00–1.49)
Prothrombin Time: 16.1 seconds — ABNORMAL HIGH (ref 11.6–15.2)

## 2012-01-20 LAB — COMPREHENSIVE METABOLIC PANEL
Albumin: 3 g/dL — ABNORMAL LOW (ref 3.5–5.2)
Alkaline Phosphatase: 84 U/L (ref 39–117)
BUN: 61 mg/dL — ABNORMAL HIGH (ref 6–23)
Calcium: 9.5 mg/dL (ref 8.4–10.5)
GFR calc Af Amer: 7 mL/min — ABNORMAL LOW (ref >90)
Glucose, Bld: 110 mg/dL — ABNORMAL HIGH (ref 70–99)
Potassium: 4.3 mEq/L (ref 3.5–5.1)
Sodium: 132 mEq/L — ABNORMAL LOW (ref 135–145)
Total Protein: 9.1 g/dL — ABNORMAL HIGH (ref 6.0–8.3)

## 2012-01-20 LAB — CARDIAC PANEL(CRET KIN+CKTOT+MB+TROPI)
CK, MB: 4.9 ng/mL — ABNORMAL HIGH (ref 0.3–4.0)
Total CK: 213 U/L (ref 7–232)
Troponin I: <0.3 ng/mL (ref <0.30)

## 2012-01-20 LAB — POCT I-STAT TROPONIN I

## 2012-01-20 LAB — TYPE AND SCREEN: ABO/RH(D): O POS

## 2012-01-20 MED ORDER — IPRATROPIUM BROMIDE 0.02 % IN SOLN
0.5000 mg | Freq: Once | RESPIRATORY_TRACT | Status: AC
  Administered 2012-01-20: 0.5 mg via RESPIRATORY_TRACT
  Filled 2012-01-20: qty 2.5

## 2012-01-20 MED ORDER — ALBUTEROL SULFATE HFA 108 (90 BASE) MCG/ACT IN AERS: 1.0000 | INHALATION_SPRAY | Freq: Four times a day (QID) | RESPIRATORY_TRACT | Status: DC | PRN

## 2012-01-20 MED ORDER — ALBUTEROL SULFATE (5 MG/ML) 0.5% IN NEBU
2.5000 mg | INHALATION_SOLUTION | Freq: Once | RESPIRATORY_TRACT | Status: AC
  Administered 2012-01-20: 2.5 mg via RESPIRATORY_TRACT
  Filled 2012-01-20: qty 0.5

## 2012-01-20 MED ORDER — IOHEXOL 350 MG/ML SOLN
100.0000 mL | Freq: Once | INTRAVENOUS | Status: AC | PRN
  Administered 2012-01-20: 100 mL via INTRAVENOUS

## 2012-01-20 NOTE — ED Notes (Signed)
Patient transported to X-ray 

## 2012-01-20 NOTE — ED Notes (Signed)
Patient transported to CT 

## 2012-01-20 NOTE — ED Notes (Signed)
Patient is alert and oriented x4.  No complaints of pain.  Patient's wife is present to take him home. Discharge instructions were explained to the patient and the wife with no questions asked.  No further action taken.

## 2012-01-20 NOTE — ED Notes (Signed)
Ambulated pt down hallway, oxygen saturation staying <99%, pt sts he felt short of breath with walking.

## 2012-01-20 NOTE — ED Provider Notes (Signed)
History     CSN: [id number]  Arrival date & time 01/20/12  6295   First MD Initiated Contact with Patient 01/20/12 1015      Chief Complaint  Patient presents with  . Shortness of Breath    (Consider location/radiation/quality/duration/timing/severity/associated sxs/prior treatment) HPI Comments: Patient complains of fatigue and shortness of breath for the past 3 days. States that he feels like he developed a cold and now has shortness of breath, dyspnea on exertion without chest pain. He has hx of ESRD on dialysis, has lung mass which he does not wish to have treated or evaluated further- also has pleurex catheter in left chest due to pleural effusion.  His last dialysis was Friday and is due to go today at 11 AM. He denies any leg pain or swelling. Notably he was admitted to the hospital to June 10- 12th of similar symptoms required blood transfusion.  The history is provided by the patient.    Past Medical History  Diagnosis Date  . Hypertension   . CAD (coronary artery disease)   . Hyperlipidemia   . Gout   . Arthritis   . Anxiety   . Wears hearing aid   . Neuromuscular disorder     sciatica right leg  . Dialysis patient     M-W-F [address]  . Angina     takes imdur  . Chronic kidney disease     on Hemodialysis  . Cancer 2010    - monitoring- pt did not want tx  . Blood transfusion   . Myocardial infarction     X 4  . Dysrhythmia   . COPD (chronic obstructive pulmonary disease)   . Pneumonia 2009  . Anemia   . Renal insufficiency     Past Surgical History  Procedure Date  . Nose surgery     cauterized for numerous bleeds  . Kidney surgery   . Insertion of dialysis catheter   . US echocardiography   . Av fistula placement 07/11/2011    Procedure: ARTERIOVENOUS (AV) FISTULA CREATION;  Surgeon: Juleen China, MD;  Location: [facility] OR;  Service: Vascular;  Laterality: Left;  . Coronary angioplasty with stent placement 2010  . Diagnostic laparoscopy     remove scar tissue  . Eye surgery     right cataract surgery  . Cataract extraction w/phaco 08/14/2011    Procedure: CATARACT EXTRACTION PHACO AND INTRAOCULAR LENS PLACEMENT (IOC);  Surgeon: Chalmers Guest, MD;  Location: [facility] OR;  Service: Ophthalmology;  Laterality: Left;    Family History  Problem Relation Age of Onset  . Heart disease Mother   . Hypertension Mother   . Heart disease Father   . COPD Father   . Hypertension Sister   . Heart disease Sister   . Anesthesia problems Neg Hx     History  Substance Use Topics  . Smoking status: Former Smoker -- 0 years    Quit date: 05/31/1963  . Smokeless tobacco: Not on file  . Alcohol Use: No      Review of Systems  Constitutional: Positive for activity change, appetite change and fatigue. Negative for fever.  HENT: Negative for congestion and rhinorrhea.   Respiratory: Positive for cough and shortness of breath. Negative for chest tightness.   Cardiovascular: Negative for chest pain.  Gastrointestinal: Negative for nausea, vomiting and abdominal pain.  Genitourinary: Negative for dysuria and hematuria.  Musculoskeletal: Negative for back pain.  Skin: Negative for rash.  Neurological: Negative for dizziness,  weakness and headaches.    Allergies  Caffeine; Ibuprofen; Pork-derived products; Shellfish allergy; and Tarka  Home Medications   Current Outpatient Rx  Name Route Sig Dispense Refill  . AMOXICILLIN 500 MG PO CAPS Oral Take 500 mg by mouth every 8 (eight) hours. Take for 10 days.  First dose 01/16/2012.    Marland Kitchen ASPIRIN EC 81 MG PO TBEC Oral Take 81 mg by mouth at bedtime. Does not take on Sunday's.    Marland Kitchen RENO CAPS PO Oral Take 1 capsule by mouth every evening.    Marland Kitchen CLONIDINE HCL 0.2 MG PO TABS Oral Take 0.2 mg by mouth 2 (two) times daily.     Marland Kitchen CLOPIDOGREL BISULFATE 75 MG PO TABS Oral Take 75 mg by mouth daily.    . CORICIDIN HBP FLU PO Oral Take 1 tablet by mouth daily.    . FUROSEMIDE 40 MG PO TABS Oral Take 40 mg by  mouth 2 (two) times daily.     . ISOSORBIDE MONONITRATE ER 60 MG PO TB24 Oral Take 60 mg by mouth daily.     Marland Kitchen LISINOPRIL 20 MG PO TABS Oral Take 20 mg by mouth every evening.     Marland Kitchen LORAZEPAM 0.5 MG PO TABS Oral Take 0.5 mg by mouth daily as needed. For anxiety.    Marland Kitchen LOVASTATIN 20 MG PO TABS Oral Take 20 mg by mouth at bedtime.     Marland Kitchen MONTELUKAST SODIUM 10 MG PO TABS Oral Take 10 mg by mouth at bedtime.     Marland Kitchen NIFEDIPINE ER OSMOTIC 60 MG PO TB24 Oral Take 60 mg by mouth daily.    . TRAMADOL HCL 50 MG PO TABS Oral Take 50 mg by mouth 2 (two) times daily as needed. For pain. Maximum dose= 8 tablets per day    . NITROGLYCERIN 0.4 MG SL SUBL Sublingual Place 0.4 mg under the tongue every 5 (five) minutes as needed. For chest pain      BP 178/98  Pulse 78  Temp 98.2 F (36.8 C) (Oral)  Resp 18  SpO2 99%  Physical Exam  Constitutional: He is oriented to person, place, and time. He appears well-developed and well-nourished. No distress.  HENT:  Head: Normocephalic and atraumatic.  Mouth/Throat: Oropharynx is clear and moist. No oropharyngeal exudate.  Eyes: Conjunctivae and EOM are normal. Pupils are equal, round, and reactive to light.  Neck: Normal range of motion.  Cardiovascular: Normal rate and regular rhythm.   Murmur heard. Pulmonary/Chest: Effort normal and breath sounds normal.       Course rhonchi bilaterally Right upper chest dialysis catheter, left lateral chest thorax catheter  Musculoskeletal: Normal range of motion. He exhibits edema.       Trace pedal edema  Neurological: He is alert and oriented to person, place, and time. No cranial nerve deficit.  Skin: Skin is warm.    ED Course  Procedures (including critical care time)  Labs Reviewed  CBC WITH DIFFERENTIAL - Abnormal; Notable for the following:    RBC 3.08 (*)     Hemoglobin 9.3 (*)     HCT 26.1 (*)     RDW 16.0 (*)     Neutrophils Relative 80 (*)     Neutro Abs 7.8 (*)     Lymphocytes Relative 6 (*)      Lymphs Abs 0.5 (*)     Monocytes Absolute 1.1 (*)     All other components within normal limits  POCT I-STAT, CHEM 8 - Abnormal;  Notable for the following:    Sodium 133 (*)     BUN 58 (*)     Creatinine, Ser 7.40 (*)     Glucose, Bld 108 (*)     Hemoglobin 9.9 (*)     HCT 29.0 (*)     All other components within normal limits  POCT I-STAT TROPONIN I  COMPREHENSIVE METABOLIC PANEL  PROTIME-INR  CARDIAC PANEL(CRET KIN+CKTOT+MB+TROPI)  PRO B NATRIURETIC PEPTIDE  TYPE AND SCREEN   Dg Chest 2 View  01/20/2012  *RADIOLOGY REPORT*  Clinical Data: Shortness of breath  CHEST - 2 VIEW  Comparison: 12/30/2011  Findings: Cardiomediastinal silhouette is stable.  Left lower lateral pleural catheter again noted.  Stable dual lumen right IJ catheter.  Persistent nodular consolidation in the left lower lobe and lingula.  There is again noted partially loculated left lower pleural effusion.  No pulmonary edema.  IMPRESSION: Again noted left lower pleural catheter.  Persistent multifocal nodular consolidation left lower lobe and lingula. Again noted partially loculated left pleural effusion.  No pulmonary edema.  Original Report Authenticated By: Natasha Mead, M.D.     No diagnosis found.    MDM  End-stage renal disease on dialysis, chronic anemia, untreated lung mass presenting with shortness of breath for 3 days due for dialysis now. Family states has only been getting drops from pleurex catheter.  Potassium within normal limits. No Volume Overload on chest x-ray. Need for dialysis today discussed with Dr. Lowell Guitar.  D/w Dr. Algie Coffer who knows patient well. Agrees with dialysis.  If still SOB after, will likely need admission.  Hemoglobin stable. Electrolytes stable. No hyperkalemia or acidosis. Nephrology consult by Dr. Bettina Gavia complete. Dialysis arranged at 1130 tomorrow at his center. Patient ambulated without desaturation or SOB. Requesting breathing treatment for home.  Informed Dr. Algie Coffer of ED  findings and renal recommendation.  Agrees with Discharge and close follow up this week.  Patient and wife in agreement.  Continue to refuse further workup of lung mass.   Date: 01/20/2012  Rate: 75  Rhythm: normal sinus rhythm and premature atrial contractions (PAC)  QRS Axis: normal  Intervals: normal  ST/T Wave abnormalities: normal  Conduction Disutrbances:none  Narrative Interpretation:   Old EKG Reviewed: unchanged  CRITICAL CARE Performed by: Glynn Octave   Total critical care time: 30  Critical care time was exclusive of separately billable procedures and treating other patients.  Critical care was necessary to treat or prevent imminent or life-threatening deterioration.  Critical care was time spent personally by me on the following activities: development of treatment plan with patient and/or surrogate as well as nursing, discussions with consultants, evaluation of patient's response to treatment, examination of patient, obtaining history from patient or surrogate, ordering and performing treatments and interventions, ordering and review of laboratory studies, ordering and review of radiographic studies, pulse oximetry and re-evaluation of patient's condition.         Glynn Octave, MD 01/20/12 2021

## 2012-01-20 NOTE — ED Notes (Signed)
Pt c/o SOB x 3 days; pt sts recently treated for bronchitis; pt sts improvement in cough but now SOB; pt denies pain

## 2012-01-20 NOTE — ED Notes (Signed)
C/o SOB on exertion & orthopnea since Friday. Denies SOB presently. Respirations even & unlabored, no distress.  Has hemodialysis mon, wed, fri. Reports non prod cough, no fever. No extremity edema noted

## 2012-01-20 NOTE — Discharge Instructions (Signed)
Dyspnea Followup for dialysis tomorrow at 1130.  Follow up with Dr. Algie Coffer this week. Return to the ED if you develop new or worsening symptoms.  Shortness of breath (dyspnea) is the feeling of uneasy breathing. Dyspnea should be evaluated promptly. DIAGNOSIS  Many tests may be done to find why you are having shortness of breath. Tests may include:  A chest X-ray.   A lung function test.   Blood tests.   Recordings of the electrical activity of the heart (electrocardiogram).   Exercise testing.   Sound wave images of the heart (a cardiac echocardiogram).   A scan.  A cause for your shortness of breath may not be identified initially. In this case, it is important to have a follow-up exam with your caregiver. HOME CARE INSTRUCTIONS   Do not smoke. Smoking is a common cause of shortness of breath. Ask for help to stop smoking.   Avoid being around chemicals that may bother your breathing, such as paint fumes or dust.   Rest as needed. Slowly begin your usual activities.   If medications were prescribed, take them as directed for the full length of time directed. This includes oxygen and any inhaled medications, if prescribed.   It is very important that you follow up with your caregiver or other physician as directed. Waiting to do so or failure to follow up could result in worsening of your condition, possible disability, or death.   Be sure you understand what to do or who to call if your shortness of breath worsens.  SEEK MEDICAL CARE IF:   Your condition does not improve in the time expected.   You have a hard time doing your normal activities even with rest.   You have any side effects from or problems with medications prescribed.  SEEK IMMEDIATE MEDICAL CARE IF:   You feel your shortness of breath is getting worse.   You feel lightheaded, faint or develop a cough not controlled with medications.   You start coughing up blood.   You get pain with breathing.    You get chest pain or pain in your arms, shoulders or belly (abdomen).   You have a fever.   You are unable to walk up stairs or exercise the way you normally can.  MAKE SURE YOU:   Understand these instructions.   Will watch your condition.   Will get help right away if you are not doing well or get worse.  Document Released: 08/15/2004 Document Revised: 03/20/2011 Document Reviewed: 11/23/2009 [facility] Patient Information 2012 [place], [place].

## 2012-01-22 ENCOUNTER — Other Ambulatory Visit: Payer: Self-pay

## 2012-01-27 MED ORDER — SODIUM CHLORIDE 0.9 % IJ SOLN: 3.0000 mL | INTRAMUSCULAR | Status: DC | PRN

## 2012-01-28 ENCOUNTER — Ambulatory Visit (HOSPITAL_COMMUNITY)
Admission: RE | Admit: 2012-01-28 | Discharge: 2012-01-28 | Disposition: A | Discharge status: Home / Self Care | Payer: Medicare Other | Source: Ambulatory Visit | Attending: Surgery | Admitting: Surgery

## 2012-01-28 ENCOUNTER — Encounter (HOSPITAL_COMMUNITY)
Admission: RE | Disposition: A | Discharge status: Home / Self Care | Payer: Self-pay | Source: Ambulatory Visit | Attending: Surgery

## 2012-01-28 DIAGNOSIS — Y849 Medical procedure, unspecified as the cause of abnormal reaction of the patient, or of later complication, without mention of misadventure at the time of the procedure: Secondary | ICD-10-CM | POA: Insufficient documentation

## 2012-01-28 DIAGNOSIS — T82598A Other mechanical complication of other cardiac and vascular devices and implants, initial encounter: Secondary | ICD-10-CM | POA: Insufficient documentation

## 2012-01-28 DIAGNOSIS — J449 Chronic obstructive pulmonary disease, unspecified: Secondary | ICD-10-CM | POA: Insufficient documentation

## 2012-01-28 DIAGNOSIS — J4489 Other specified chronic obstructive pulmonary disease: Secondary | ICD-10-CM | POA: Insufficient documentation

## 2012-01-28 DIAGNOSIS — T82898A Other specified complication of vascular prosthetic devices, implants and grafts, initial encounter: Secondary | ICD-10-CM

## 2012-01-28 DIAGNOSIS — E785 Hyperlipidemia, unspecified: Secondary | ICD-10-CM | POA: Insufficient documentation

## 2012-01-28 DIAGNOSIS — I2510 Atherosclerotic heart disease of native coronary artery without angina pectoris: Secondary | ICD-10-CM | POA: Insufficient documentation

## 2012-01-28 DIAGNOSIS — Z992 Dependence on renal dialysis: Secondary | ICD-10-CM | POA: Insufficient documentation

## 2012-01-28 DIAGNOSIS — N186 End stage renal disease: Secondary | ICD-10-CM | POA: Insufficient documentation

## 2012-01-28 DIAGNOSIS — I252 Old myocardial infarction: Secondary | ICD-10-CM | POA: Insufficient documentation

## 2012-01-28 DIAGNOSIS — I120 Hypertensive chronic kidney disease with stage 5 chronic kidney disease or end stage renal disease: Secondary | ICD-10-CM | POA: Insufficient documentation

## 2012-01-28 HISTORY — PX: SHUNTOGRAM: SHX5491

## 2012-01-28 LAB — POCT I-STAT, CHEM 8
BUN: 36 mg/dL — ABNORMAL HIGH (ref 6–23)
Chloride: 100 mEq/L (ref 96–112)
Creatinine, Ser: 5.1 mg/dL — ABNORMAL HIGH (ref 0.50–1.35)
Potassium: 4.7 mEq/L (ref 3.5–5.1)
Sodium: 139 mEq/L (ref 135–145)
TCO2: 29 mmol/L (ref 0–100)

## 2012-01-28 LAB — POCT ACTIVATED CLOTTING TIME: Activated Clotting Time: 209 seconds

## 2012-01-28 SURGERY — ASSESSMENT, SHUNT FUNCTION, WITH CONTRAST RADIOGRAPHIC STUDY
Anesthesia: LOCAL

## 2012-01-28 MED ORDER — HYDRALAZINE HCL 20 MG/ML IJ SOLN: 10.0000 mg | INTRAMUSCULAR | Status: DC | PRN

## 2012-01-28 MED ORDER — HEPARIN SODIUM (PORCINE) 1000 UNIT/ML IJ SOLN
INTRAMUSCULAR | Status: AC
  Filled 2012-01-28: qty 1

## 2012-01-28 MED ORDER — HEPARIN (PORCINE) IN NACL 2-0.9 UNIT/ML-% IJ SOLN
INTRAMUSCULAR | Status: AC
  Filled 2012-01-28: qty 1000

## 2012-01-28 MED ORDER — METOPROLOL TARTRATE 1 MG/ML IV SOLN: 2.0000 mg | INTRAVENOUS | Status: DC | PRN

## 2012-01-28 MED ORDER — HYDRALAZINE HCL 20 MG/ML IJ SOLN
INTRAMUSCULAR | Status: AC
  Filled 2012-01-28: qty 1

## 2012-01-28 MED ORDER — LIDOCAINE HCL (PF) 1 % IJ SOLN
INTRAMUSCULAR | Status: AC
  Filled 2012-01-28: qty 30

## 2012-01-28 MED ORDER — LABETALOL HCL 5 MG/ML IV SOLN
10.0000 mg | INTRAVENOUS | Status: DC | PRN
  Administered 2012-01-28: 10 mg via INTRAVENOUS

## 2012-01-28 NOTE — H&P (Signed)
[facility]   Patient name: [name] MRN: [id number] DOB: 1925/10/26 Sex: male    No chief complaint on file.   HISTORY OF PRESENT ILLNESS: The patient comes in today for further evaluation of his left upper arm fistula which was placed in December of 2012.  Past Medical History  Diagnosis Date  . Hypertension   . CAD (coronary artery disease)   . Hyperlipidemia   . Gout   . Arthritis   . Anxiety   . Wears hearing aid   . Neuromuscular disorder     sciatica right leg  . Dialysis patient     M-W-F [address]  . Angina     takes imdur  . Chronic kidney disease     on Hemodialysis  . Cancer 2010    - monitoring- pt did not want tx  . Blood transfusion   . Myocardial infarction     X 4  . Dysrhythmia   . COPD (chronic obstructive pulmonary disease)   . Pneumonia 2009  . Anemia   . Renal insufficiency     Past Surgical History  Procedure Date  . Nose surgery     cauterized for numerous bleeds  . Kidney surgery   . Insertion of dialysis catheter   . US echocardiography   . Av fistula placement 07/11/2011    Procedure: ARTERIOVENOUS (AV) FISTULA CREATION;  Surgeon: Juleen China, MD;  Location: [facility] OR;  Service: Vascular;  Laterality: Left;  . Coronary angioplasty with stent placement 2010  . Diagnostic laparoscopy     remove scar tissue  . Eye surgery     right cataract surgery  . Cataract extraction w/phaco 08/14/2011    Procedure: CATARACT EXTRACTION PHACO AND INTRAOCULAR LENS PLACEMENT (IOC);  Surgeon: Chalmers Guest, MD;  Location: [facility] OR;  Service: Ophthalmology;  Laterality: Left;    History   Social History  . Marital Status: Married    Spouse Name: N/A    Number of Children: N/A  . Years of Education: N/A   Occupational History  . Not on file.   Social History Main Topics  . Smoking status: Former Smoker -- 0 years    Quit date: 05/31/1963  . Smokeless tobacco: Not on file  . Alcohol Use: No  . Drug  Use: No  . Sexually Active: Not on file   Other Topics Concern  . Not on file   Social History Narrative  . No narrative on file    Family History  Problem Relation Age of Onset  . Heart disease Mother   . Hypertension Mother   . Heart disease Father   . COPD Father   . Hypertension Sister   . Heart disease Sister   . Anesthesia problems Neg Hx     Allergies as of 01/22/2012 - Review Complete 01/20/2012  Allergen Reaction Noted  . Caffeine  12/30/2011  . Ibuprofen Other (See Comments) 08/14/2011  . Pork-derived products  12/30/2011  . Shellfish allergy  12/30/2011  . Tarka (trandolapril-verapamil hcl er) Cough 05/24/2011    No current facility-administered medications on file prior to encounter.   Current Outpatient Prescriptions on File Prior to Encounter  Medication Sig Dispense Refill  . albuterol (PROVENTIL HFA;VENTOLIN HFA) 108 (90 BASE) MCG/ACT inhaler Inhale 1-2 puffs into the lungs every 6 (six) hours as needed for wheezing.  1 Inhaler  0  . amoxicillin (AMOXIL) 500 MG capsule Take 500 mg by mouth every 8 (eight)  hours. Take for 10 days.  First dose 01/16/2012.      Marland Kitchen aspirin EC 81 MG tablet Take 81 mg by mouth at bedtime. Does not take on Sunday's.      . B Complex-C-Folic Acid (RENO CAPS PO) Take 1 capsule by mouth every evening.      . cloNIDine (CATAPRES) 0.2 MG tablet Take 0.2 mg by mouth 2 (two) times daily.       . clopidogrel (PLAVIX) 75 MG tablet Take 75 mg by mouth daily.      Marland Kitchen DM-APAP-CPM (CORICIDIN HBP FLU PO) Take 1 tablet by mouth daily.      . furosemide (LASIX) 40 MG tablet Take 40 mg by mouth 2 (two) times daily.       . isosorbide mononitrate (IMDUR) 60 MG 24 hr tablet Take 60 mg by mouth daily.       Marland Kitchen lisinopril (PRINIVIL,ZESTRIL) 20 MG tablet Take 20 mg by mouth every evening.       Marland Kitchen LORazepam (ATIVAN) 0.5 MG tablet Take 0.5 mg by mouth daily as needed. For anxiety.      . lovastatin (MEVACOR) 20 MG tablet Take 20 mg by mouth at bedtime.        . montelukast (SINGULAIR) 10 MG tablet Take 10 mg by mouth at bedtime.       Marland Kitchen NIFEdipine (NIFEDICAL XL) 60 MG 24 hr tablet Take 60 mg by mouth daily.      . nitroGLYCERIN (NITROSTAT) 0.4 MG SL tablet Place 0.4 mg under the tongue every 5 (five) minutes as needed. For chest pain      . traMADol (ULTRAM) 50 MG tablet Take 50 mg by mouth 2 (two) times daily as needed. For pain. Maximum dose= 8 tablets per day         REVIEW OF SYSTEMS: No chest pain no shortness of breath  PHYSICAL EXAMINATION:   Vital signs are BP 199/98  Pulse 67  Temp 98 F (36.7 C) (Oral)  Resp 14  Ht 6\' 2"  (1.88 m)  Wt 178 lb (80.74 kg)  BMI 22.85 kg/m2  SpO2 100% General: The patient appears their stated age. HEENT:  No gross abnormalities Pulmonary:  Non labored breathing Musculoskeletal: There are no major deformities. Neurologic: No focal weakness or paresthesias are detected, Skin: There are no ulcer or rashes noted. Psychiatric: The patient has normal affect. Cardiovascular: Thrill within the fistula   Diagnostic Studies None  Assessment: End-stage renal disease Plan: Fistulogram and possible intervention today  V. Charlena Cross, M.D. [facility] Office: [phone] Pager:  [phone]

## 2012-01-28 NOTE — Interval H&P Note (Signed)
History and Physical Interval Note:  01/28/2012 10:51 AM  [name]  has presented today for surgery, with the diagnosis of End stage renal  The various methods of treatment have been discussed with the patient and family. After consideration of risks, benefits and other options for treatment, the patient has consented to  Procedure(s) (LRB): SHUNTOGRAM (N/A) as a surgical intervention .  The patient's history has been reviewed, patient examined, no change in status, stable for surgery.  I have reviewed the patients' chart and labs.  Questions were answered to the patient's satisfaction.     BRABHAM IV, V. WELLS

## 2012-01-28 NOTE — Op Note (Signed)
[facility]  Patient name: [name] MRN: [id number] DOB: 1925-08-29 Sex: male  01/28/2012 Pre-operative Diagnosis: Non-maturing left upper arm AV fistula Post-operative diagnosis:  Same Surgeon:  Jorge Ny Procedure Performed:  1.  ultrasound access left arm fistula  2.  fistulogram  3.  PTA venous (cephalic vein)     Indications:  The patient had a left upper arm fistula placed 7 months ago. They're having difficulty with access. He comes in today for fistulogram  Procedure:  The patient was identified in the holding area and taken to room 8.  The patient was then placed supine on the table and prepped and draped in the usual sterile fashion.  A time out was called.  Ultrasound was used to evaluate the fistula.  The vein was patent and compressible.  A digital ultrasound image was acquired.  The fistula was then accessed under ultrasound guidance using a micropuncture needle.  An 018 wire was then asvanced without resistance and a micropuncture sheath was placed.  Contrast injections were then performed through the sheath.  Findings:  The central venous system is widely patent. The arterial venous anastomosis is widely patent. There is a diffusely diseased cephalic vein which appears to be sclerotic. No focal stenosis was identified however the vein is narrowed down particularly in its midportion.   Intervention:  After the above images were obtained the decision was made to proceed with intervention. An 035 Bentson wire was placed. The micropuncture sheath was removed and exchanged for a 6 French sheath. The patient was fully heparinized. A 4 mm balloon was used to perform angioplasty of the majority of the cephalic vein in the upper arm. Patient had significant discomfort with balloon inflation. The balloon was taken to 24 atmospheres which was burst pressure which correlated with the diameter of 4.24 mm. Once the entire cephalic vein was  angioplasty a followup study was performed. It did appear to have improved flow through the vein. There is also an improved thrill within the fistula. I did not want to up size the balloon at this time for fear of vein rupture. At this point catheters and wires were removed and the patient taken the holding area for sheath pull once his coagulation profile corrects.  Impression:  #1  successful angioplasty of a sclerotic cephalic vein using a 4 mm balloon  #2  the patient could potentially return for a repeat study and outpatient with a 5 mm balloon access is still compromised  #3  no evidence of central venous stenosis     V. Durene Cal, M.D. [facility] Office: [phone] Pager:  [phone]

## 2012-01-29 LAB — POCT ACTIVATED CLOTTING TIME: Activated Clotting Time: 179 seconds

## 2012-03-26 ENCOUNTER — Other Ambulatory Visit (HOSPITAL_COMMUNITY): Payer: Self-pay | Admitting: Nephrology

## 2012-03-26 DIAGNOSIS — N186 End stage renal disease: Secondary | ICD-10-CM

## 2012-03-27 ENCOUNTER — Other Ambulatory Visit: Payer: Self-pay

## 2012-03-27 DIAGNOSIS — T82598A Other mechanical complication of other cardiac and vascular devices and implants, initial encounter: Secondary | ICD-10-CM

## 2012-03-27 DIAGNOSIS — T82898A Other specified complication of vascular prosthetic devices, implants and grafts, initial encounter: Secondary | ICD-10-CM

## 2012-03-31 ENCOUNTER — Ambulatory Visit (HOSPITAL_COMMUNITY)
Admission: RE | Admit: 2012-03-31 | Discharge: 2012-03-31 | Disposition: A | Discharge status: Home / Self Care | Payer: Medicare Other | Source: Ambulatory Visit | Attending: Nephrology | Admitting: Nephrology

## 2012-03-31 DIAGNOSIS — Z4901 Encounter for fitting and adjustment of extracorporeal dialysis catheter: Secondary | ICD-10-CM | POA: Insufficient documentation

## 2012-03-31 DIAGNOSIS — Z992 Dependence on renal dialysis: Secondary | ICD-10-CM | POA: Insufficient documentation

## 2012-03-31 DIAGNOSIS — N186 End stage renal disease: Secondary | ICD-10-CM

## 2012-03-31 MED ORDER — CHLORHEXIDINE GLUCONATE 4 % EX LIQD
CUTANEOUS | Status: AC
  Filled 2012-03-31: qty 30

## 2012-04-06 ENCOUNTER — Encounter: Payer: Self-pay | Admitting: Vascular Surgery

## 2012-04-07 ENCOUNTER — Encounter: Payer: Self-pay | Admitting: *Deleted

## 2012-04-07 ENCOUNTER — Encounter: Payer: Self-pay | Admitting: Vascular Surgery

## 2012-04-07 ENCOUNTER — Encounter (INDEPENDENT_AMBULATORY_CARE_PROVIDER_SITE_OTHER): Payer: Medicare Other

## 2012-04-07 ENCOUNTER — Ambulatory Visit (INDEPENDENT_AMBULATORY_CARE_PROVIDER_SITE_OTHER): Payer: Medicare Other | Admitting: Vascular Surgery

## 2012-04-07 VITALS — BP 185/94 | HR 65 | Resp 20 | Ht 71.0 in | Wt 176.0 lb

## 2012-04-07 DIAGNOSIS — T82598A Other mechanical complication of other cardiac and vascular devices and implants, initial encounter: Secondary | ICD-10-CM

## 2012-04-07 DIAGNOSIS — N186 End stage renal disease: Secondary | ICD-10-CM

## 2012-04-07 NOTE — Progress Notes (Signed)
The patient presents today for evaluation of left upper arm AV fistula. He has had this placed by Dr. Myra Gianotti in December of 2012. He had a diminished flow through this and had a fistulogram and venous angioplasty by Dr. Myra Gianotti in July. His operative report he reports this is dilated to 4 mm and could be redilated of 5 mm of continued to have difficulty. He subsequently travel to [place] on a trip and had difficult access there and had a dialysis catheter placed for hemodialysis. He reports that there was a new staff member at the dialysis center infiltrated the fistula on the first attempt. He is seen today for further evaluation. Since returning to [facility] he has been using the fistula and the catheter has been removed.  Past Medical History  Diagnosis Date  . Hypertension   . CAD (coronary artery disease)   . Hyperlipidemia   . Gout   . Arthritis   . Anxiety   . Wears hearing aid   . Neuromuscular disorder     sciatica right leg  . Dialysis patient     M-W-F [address]  . Angina     takes imdur  . Chronic kidney disease     on Hemodialysis  . Cancer 2010    - monitoring- pt did not want tx  . Blood transfusion   . Myocardial infarction     X 4  . Dysrhythmia   . COPD (chronic obstructive pulmonary disease)   . Pneumonia 2009  . Anemia   . Renal insufficiency     History  Substance Use Topics  . Smoking status: Former Smoker -- 0 years    Types: Cigarettes    Quit date: 05/31/1963  . Smokeless tobacco: Former Neurosurgeon    Quit date: 04/07/1937  . Alcohol Use: No    Family History  Problem Relation Age of Onset  . Heart disease Mother   . Hypertension Mother   . Heart disease Father   . COPD Father   . Hypertension Sister   . Heart disease Sister   . Anesthesia problems Neg Hx     Allergies  Allergen Reactions  . Caffeine     Seventh day advantist  . Ibuprofen Other (See Comments)    Messed up kidneys  . Pork-Derived Products     Seventh day  advantist  . Shellfish Allergy     Seventh day advantist  . Tarka (Trandolapril-Verapamil Hcl Er) Cough    Current outpatient prescriptions:albuterol (PROVENTIL HFA;VENTOLIN HFA) 108 (90 BASE) MCG/ACT inhaler, Inhale 1-2 puffs into the lungs every 6 (six) hours as needed for wheezing., Disp: 1 Inhaler, Rfl: 0;  amoxicillin (AMOXIL) 500 MG capsule, Take 500 mg by mouth every 8 (eight) hours. Take for 10 days.  First dose 01/16/2012., Disp: , Rfl: ;  aspirin EC 81 MG tablet, Take 81 mg by mouth at bedtime. Does not take on Sunday's., Disp: , Rfl:  B Complex-C-Folic Acid (RENO CAPS PO), Take 1 capsule by mouth every evening., Disp: , Rfl: ;  cloNIDine (CATAPRES) 0.2 MG tablet, Take 0.2 mg by mouth 2 (two) times daily. , Disp: , Rfl: ;  clopidogrel (PLAVIX) 75 MG tablet, Take 75 mg by mouth daily., Disp: , Rfl: ;  DM-APAP-CPM (CORICIDIN HBP FLU PO), Take 1 tablet by mouth daily., Disp: , Rfl: ;  furosemide (LASIX) 40 MG tablet, Take 40 mg by mouth 2 (two) times daily. , Disp: , Rfl:  isosorbide mononitrate (IMDUR) 60 MG 24 hr  tablet, Take 120 mg by mouth daily. , Disp: , Rfl: ;  lisinopril (PRINIVIL,ZESTRIL) 20 MG tablet, Take 20 mg by mouth every evening. , Disp: , Rfl: ;  LORazepam (ATIVAN) 0.5 MG tablet, Take 0.5 mg by mouth daily as needed. For anxiety., Disp: , Rfl: ;  lovastatin (MEVACOR) 20 MG tablet, Take 20 mg by mouth at bedtime. , Disp: , Rfl:  montelukast (SINGULAIR) 10 MG tablet, Take 10 mg by mouth at bedtime. , Disp: , Rfl: ;  NIFEdipine (NIFEDICAL XL) 60 MG 24 hr tablet, Take 60 mg by mouth daily., Disp: , Rfl: ;  nitroGLYCERIN (NITROSTAT) 0.4 MG SL tablet, Place 0.4 mg under the tongue every 5 (five) minutes as needed. For chest pain, Disp: , Rfl:  traMADol (ULTRAM) 50 MG tablet, Take 50 mg by mouth 2 (two) times daily as needed. For pain. Maximum dose= 8 tablets per day, Disp: , Rfl:   BP 185/94  Pulse 65  Resp 20  Ht 5\' 11"  (1.803 m)  Wt 176 lb (79.833 kg)  BMI 24.55 kg/m2  Body mass  index is 24.55 kg/(m^2).       On physical exam he does have a patent upper arm fistula. The diameter is quite nice from the antecubital space to the proximal third. 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 He underwent a duplex imaging of this and have reviewed this with the patient and his wife. This shows very small vein pass this area of stenosis with nearly 500 cm/s velocities above this.  Impression and plan a patent left upper arm AV fistula with poor flow. He does not have any evidence of impending rupture with the false aneurysm. The main problem is of stenosis and poor flow. I have recommended he undergo repeat shuntogram and angioplasty we will schedule this on a Tuesday or Thursday at his convenience with Dr. Myra Gianotti

## 2012-04-08 ENCOUNTER — Other Ambulatory Visit: Payer: Self-pay | Admitting: *Deleted

## 2012-04-08 IMAGING — XA IR EXTERNAL REPAIR CV CATH
1 series · 2 of 2 positions shown · non-contrast
Comparison: none

CLINICAL DATA: Broken arterial clamp of indwelling tunneled
dialysis catheter.

[Series 1: run · 2 of 2 slices shown]
[im 1/2]
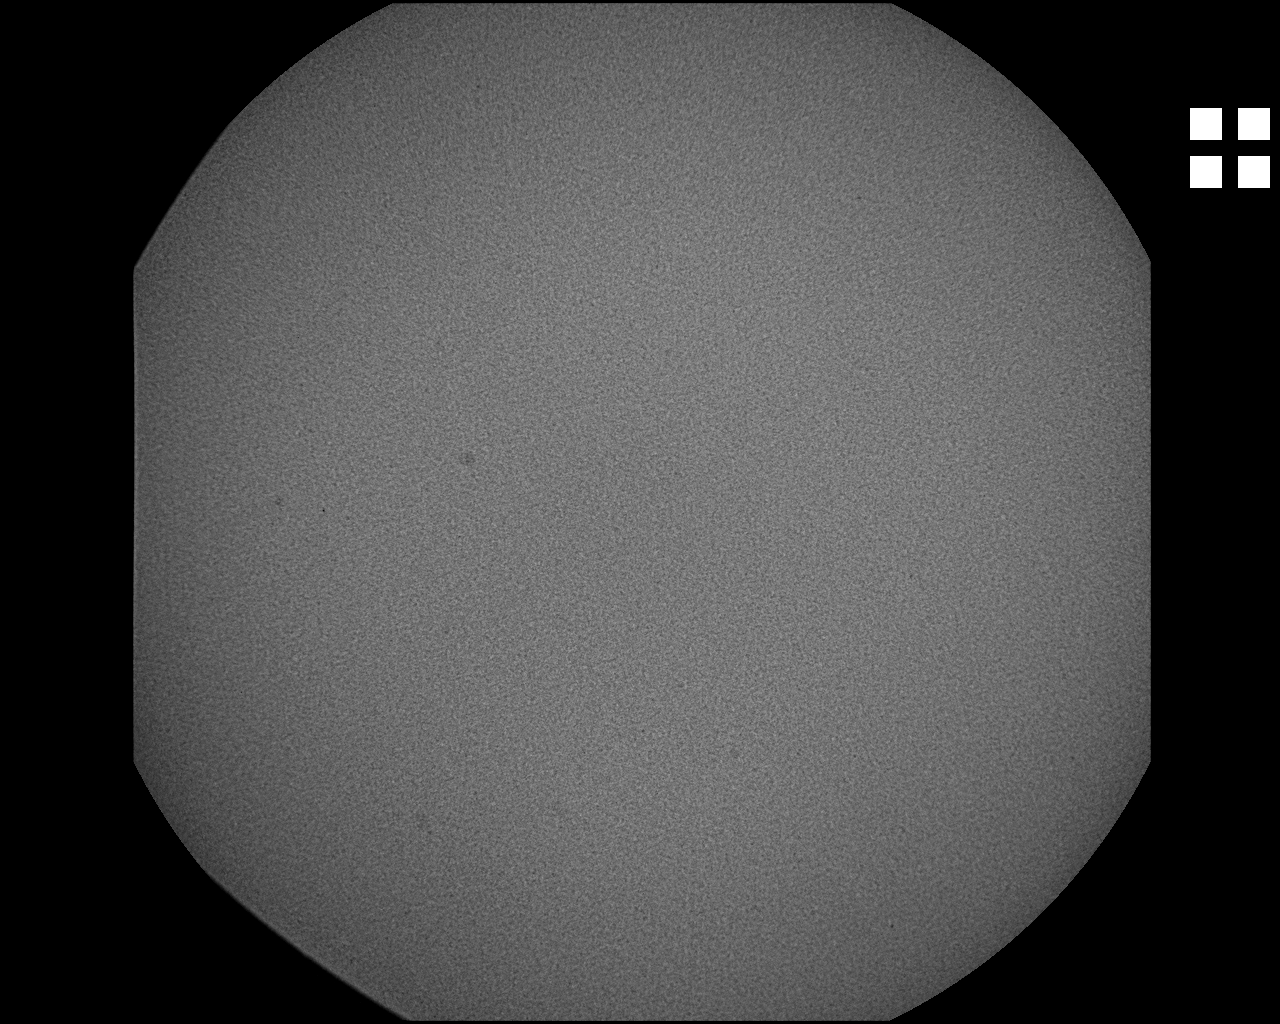
[im 2/2]
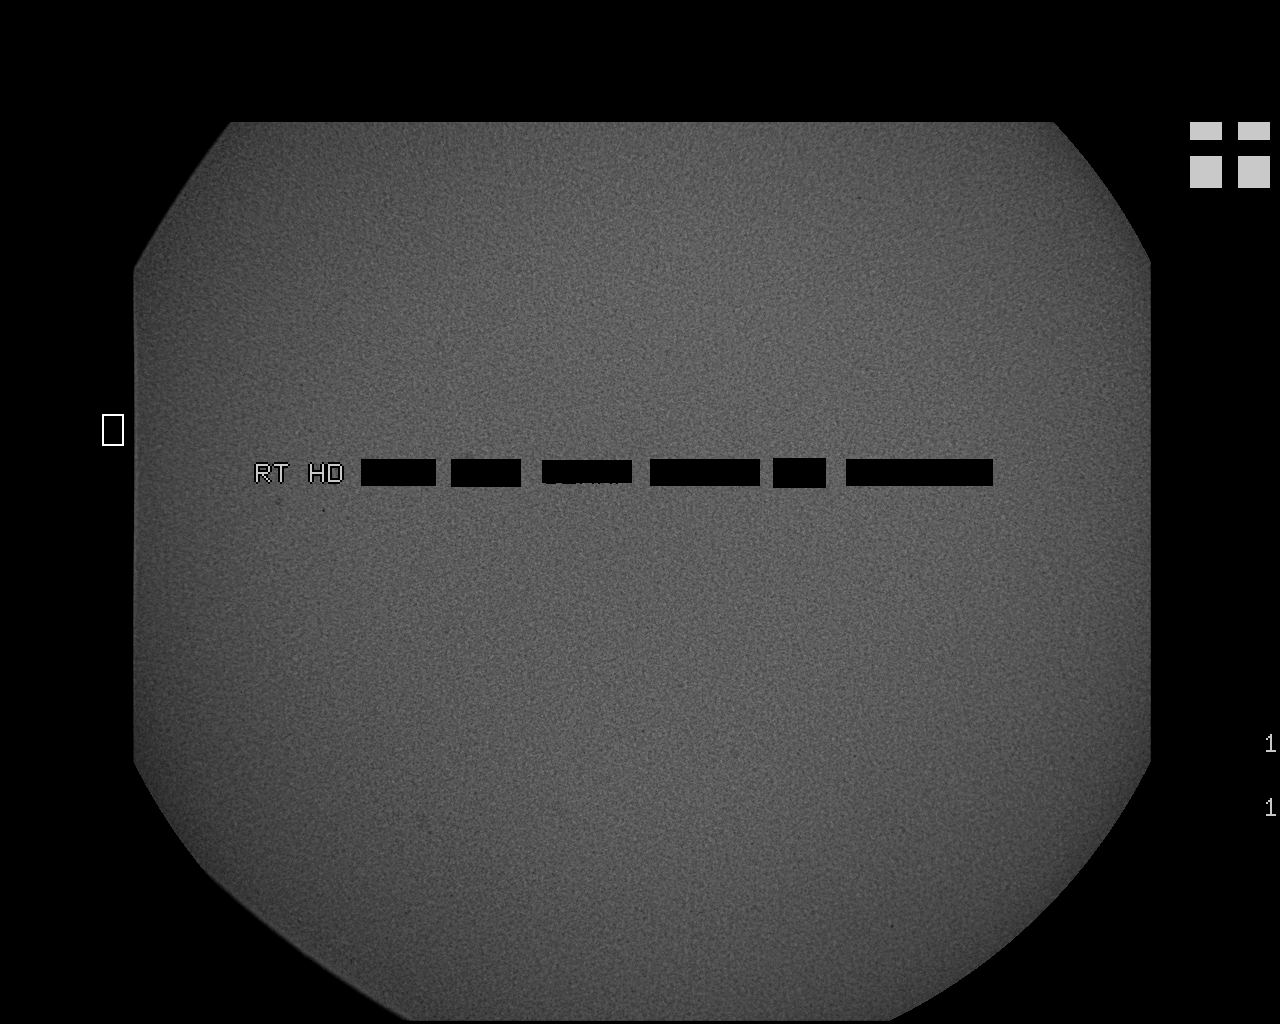

[2 of 2 positions shown; findings below may reference images not displayed]

EXTERNAL REPAIR OF TUNNELED HEMODIALYSIS CATHETER

The preexisting right-sided tunneled dialysis catheter was prepped
with chlorhexidine.  The arterial lumen was aspirated of heparin.
The external limb was clamped.  The hub was then cut off and the
broken clamp replaced with a new clamp.  A new hub was then
attached to the external limb from a repair kit utilizing sterile
technique.  The catheter was then aspirated and flushed with saline
and injected with appropriate volume heparin dwells.
IMPRESSION: Repair of tunneled dialysis catheter as above.

## 2012-04-14 ENCOUNTER — Encounter (HOSPITAL_COMMUNITY): Payer: Self-pay | Admitting: Pharmacy Technician

## 2012-04-15 ENCOUNTER — Encounter (HOSPITAL_COMMUNITY)
Admission: RE | Disposition: A | Discharge status: Home / Self Care | Payer: Self-pay | Source: Ambulatory Visit | Attending: Surgery

## 2012-04-15 ENCOUNTER — Ambulatory Visit (HOSPITAL_COMMUNITY)
Admission: RE | Admit: 2012-04-15 | Discharge: 2012-04-15 | Disposition: A | Discharge status: Home / Self Care | Payer: Medicare Other | Source: Ambulatory Visit | Attending: Surgery | Admitting: Surgery

## 2012-04-15 DIAGNOSIS — J4489 Other specified chronic obstructive pulmonary disease: Secondary | ICD-10-CM | POA: Insufficient documentation

## 2012-04-15 DIAGNOSIS — N186 End stage renal disease: Secondary | ICD-10-CM | POA: Insufficient documentation

## 2012-04-15 DIAGNOSIS — Z992 Dependence on renal dialysis: Secondary | ICD-10-CM | POA: Insufficient documentation

## 2012-04-15 DIAGNOSIS — T82898A Other specified complication of vascular prosthetic devices, implants and grafts, initial encounter: Secondary | ICD-10-CM | POA: Insufficient documentation

## 2012-04-15 DIAGNOSIS — J449 Chronic obstructive pulmonary disease, unspecified: Secondary | ICD-10-CM | POA: Insufficient documentation

## 2012-04-15 DIAGNOSIS — I120 Hypertensive chronic kidney disease with stage 5 chronic kidney disease or end stage renal disease: Secondary | ICD-10-CM | POA: Insufficient documentation

## 2012-04-15 DIAGNOSIS — Y832 Surgical operation with anastomosis, bypass or graft as the cause of abnormal reaction of the patient, or of later complication, without mention of misadventure at the time of the procedure: Secondary | ICD-10-CM | POA: Insufficient documentation

## 2012-04-15 HISTORY — PX: SHUNTOGRAM: SHX5491

## 2012-04-15 LAB — POCT I-STAT, CHEM 8
Chloride: 102 mEq/L (ref 96–112)
HCT: 38 % — ABNORMAL LOW (ref 39.0–52.0)
Potassium: 5.1 mEq/L (ref 3.5–5.1)

## 2012-04-15 LAB — POCT ACTIVATED CLOTTING TIME: Activated Clotting Time: 185 seconds

## 2012-04-15 SURGERY — ASSESSMENT, SHUNT FUNCTION, WITH CONTRAST RADIOGRAPHIC STUDY
Anesthesia: LOCAL

## 2012-04-15 MED ORDER — LIDOCAINE HCL (PF) 1 % IJ SOLN
INTRAMUSCULAR | Status: AC
  Filled 2012-04-15: qty 30

## 2012-04-15 MED ORDER — FENTANYL CITRATE 0.05 MG/ML IJ SOLN
INTRAMUSCULAR | Status: AC
  Filled 2012-04-15: qty 2

## 2012-04-15 MED ORDER — HEPARIN SODIUM (PORCINE) 1000 UNIT/ML IJ SOLN
INTRAMUSCULAR | Status: AC
  Filled 2012-04-15: qty 1

## 2012-04-15 MED ORDER — NITROGLYCERIN IN D5W 200-5 MCG/ML-% IV SOLN
INTRAVENOUS | Status: AC
  Filled 2012-04-15: qty 250

## 2012-04-15 MED ORDER — SODIUM CHLORIDE 0.9 % IJ SOLN: 3.0000 mL | INTRAMUSCULAR | Status: DC | PRN

## 2012-04-15 NOTE — Op Note (Signed)
[facility]  Patient name: [name] MRN: [id number] DOB: 01-Feb-1926 Sex: male  04/15/2012 Pre-operative Diagnosis: Poorly functioning left upper arm fistula Post-operative diagnosis:  Same Surgeon:  Jorge Ny Procedure Performed:  1.  ultrasound access, left cephalic vein fistula  2.  fistulogram  3.  angioplasty, left cephalic vein (balloon assisted maturation)  4.  followup x1  5.  intravascular administration of nitroglycerin    Indications:   The patient has he comes in today for further evaluation.had a left upper arm fistula placed. They're having trouble with access. He has had infiltration already and a pseudoaneurysm formed.  Procedure:  The patient was identified in the holding area and taken to room 8.  The patient was then placed supine on the table and prepped and draped in the usual sterile fashion.  A time out was called.  Ultrasound was used to evaluate the fistula.  The vein was patent and compressible.  A digital ultrasound image was acquired.  The fistula was then accessed under ultrasound guidance using a micropuncture needle.  An 018 wire was then asvanced without resistance and a micropuncture sheath was placed.  Contrast injections were then performed through the sheath.  Findings:   the left cephalic vein is patent throughout it's course however it is diffusely narrowed in the upper arm. Pseudoaneurysmal changes are seen in the distal upper arm. The arterial venous anastomosis is widely patent. The central venous system is widely patent.    Intervention:   over a 035 wire a 6 French sheath was placed. The patient was given 4000 units of heparin. I removed the 035 wire and advanced a 014 wire into the central venous system. I then performed balloon assisted maturation with angioplasty of the entire cephalic vein using a 4 mm balloon taking it to grade pressure which corresponded to a diameter of 4.3 mm. With each inflation the  balloon was held up for 1 minute. I also administered a total of 800 mcg of nitroglycerin. On followup evaluation there was significantly improved flow through the fistula. I elected to up size to a 5 mm balloon and then performed selected angioplasty of the cephalic vein in the narrowed down to areas which were in the midportion. The balloon was taken to 10 atmospheres which correlated with a diameter of 5.3 mm. Followup study revealed  improve flow through the fistula. There is also a thrill present within the fistula that could not be felt prior to intervention. At this point the decision was made to terminate the procedure. The patient taken the holding area for sheath pull once his coagulation profile corrects.   Impression:  #1   Successful balloon assisted maturation of the left upper arm cephalic vein fistula using a 5 mm balloon.   #2   the patient remains amenable to percutaneous intervention     V. Durene Cal, M.D. [facility] [place] Office: [phone] Pager:  [phone]

## 2012-04-15 NOTE — H&P (View-Only) (Signed)
The patient presents today for evaluation of left upper arm AV fistula. He has had this placed by Dr. Brabham in December of 2012. He had a diminished flow through this and had a fistulogram and venous angioplasty by Dr. Brabham in July. His operative report he reports this is dilated to 4 mm and could be redilated of 5 mm of continued to have difficulty. He subsequently travel to [place] on a trip and had difficult access there and had a dialysis catheter placed for hemodialysis. He reports that there was a new staff member at the dialysis center infiltrated the fistula on the first attempt. He is seen today for further evaluation. Since returning to [place] he has been using the fistula and the catheter has been removed.  Past Medical History  Diagnosis Date  . Hypertension   . CAD (coronary artery disease)   . Hyperlipidemia   . Gout   . Arthritis   . Anxiety   . Wears hearing aid   . Neuromuscular disorder     sciatica right leg  . Dialysis patient     M-W-F [address]  . Angina     takes imdur  . Chronic kidney disease     on Hemodialysis  . Cancer 2010    - monitoring- pt did not want tx  . Blood transfusion   . Myocardial infarction     X 4  . Dysrhythmia   . COPD (chronic obstructive pulmonary disease)   . Pneumonia 2009  . Anemia   . Renal insufficiency     History  Substance Use Topics  . Smoking status: Former Smoker -- 0 years    Types: Cigarettes    Quit date: 05/31/1963  . Smokeless tobacco: Former User    Quit date: 04/07/1937  . Alcohol Use: No    Family History  Problem Relation Age of Onset  . Heart disease Mother   . Hypertension Mother   . Heart disease Father   . COPD Father   . Hypertension Sister   . Heart disease Sister   . Anesthesia problems Neg Hx     Allergies  Allergen Reactions  . Caffeine     Seventh day advantist  . Ibuprofen Other (See Comments)    Messed up kidneys  . Pork-Derived Products     Seventh day  advantist  . Shellfish Allergy     Seventh day advantist  . Tarka (Trandolapril-Verapamil Hcl Er) Cough    Current outpatient prescriptions:albuterol (PROVENTIL HFA;VENTOLIN HFA) 108 (90 BASE) MCG/ACT inhaler, Inhale 1-2 puffs into the lungs every 6 (six) hours as needed for wheezing., Disp: 1 Inhaler, Rfl: 0;  amoxicillin (AMOXIL) 500 MG capsule, Take 500 mg by mouth every 8 (eight) hours. Take for 10 days.  First dose 01/16/2012., Disp: , Rfl: ;  aspirin EC 81 MG tablet, Take 81 mg by mouth at bedtime. Does not take on Sunday's., Disp: , Rfl:  B Complex-C-Folic Acid (RENO CAPS PO), Take 1 capsule by mouth every evening., Disp: , Rfl: ;  cloNIDine (CATAPRES) 0.2 MG tablet, Take 0.2 mg by mouth 2 (two) times daily. , Disp: , Rfl: ;  clopidogrel (PLAVIX) 75 MG tablet, Take 75 mg by mouth daily., Disp: , Rfl: ;  DM-APAP-CPM (CORICIDIN HBP FLU PO), Take 1 tablet by mouth daily., Disp: , Rfl: ;  furosemide (LASIX) 40 MG tablet, Take 40 mg by mouth 2 (two) times daily. , Disp: , Rfl:  isosorbide mononitrate (IMDUR) 60 MG 24 hr   tablet, Take 120 mg by mouth daily. , Disp: , Rfl: ;  lisinopril (PRINIVIL,ZESTRIL) 20 MG tablet, Take 20 mg by mouth every evening. , Disp: , Rfl: ;  LORazepam (ATIVAN) 0.5 MG tablet, Take 0.5 mg by mouth daily as needed. For anxiety., Disp: , Rfl: ;  lovastatin (MEVACOR) 20 MG tablet, Take 20 mg by mouth at bedtime. , Disp: , Rfl:  montelukast (SINGULAIR) 10 MG tablet, Take 10 mg by mouth at bedtime. , Disp: , Rfl: ;  NIFEdipine (NIFEDICAL XL) 60 MG 24 hr tablet, Take 60 mg by mouth daily., Disp: , Rfl: ;  nitroGLYCERIN (NITROSTAT) 0.4 MG SL tablet, Place 0.4 mg under the tongue every 5 (five) minutes as needed. For chest pain, Disp: , Rfl:  traMADol (ULTRAM) 50 MG tablet, Take 50 mg by mouth 2 (two) times daily as needed. For pain. Maximum dose= 8 tablets per day, Disp: , Rfl:   BP 185/94  Pulse 65  Resp 20  Ht 5' 11" (1.803 m)  Wt 176 lb (79.833 kg)  BMI 24.55 kg/m2  Body mass  index is 24.55 kg/(m^2).       On physical exam he does have a patent upper arm fistula. The diameter is quite nice from the antecubital space to the proximal third. 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 He underwent a duplex imaging of this and have reviewed this with the patient and his wife. This shows very small vein pass this area of stenosis with nearly 500 cm/s velocities above this.  Impression and plan a patent left upper arm AV fistula with poor flow. He does not have any evidence of impending rupture with the false aneurysm. The main problem is of stenosis and poor flow. I have recommended he undergo repeat shuntogram and angioplasty we will schedule this on a Tuesday or Thursday at his convenience with Dr. Brabham 

## 2012-04-15 NOTE — Interval H&P Note (Signed)
History and Physical Interval Note:  04/15/2012 9:04 AM  [name]  has presented today for surgery, with the diagnosis of End stage renal  The various methods of treatment have been discussed with the patient and family. After consideration of risks, benefits and other options for treatment, the patient has consented to  Procedure(s) (LRB) with comments: SHUNTOGRAM (N/A) as a surgical intervention .  The patient's history has been reviewed, patient examined, no change in status, stable for surgery.  I have reviewed the patient's chart and labs.  Questions were answered to the patient's satisfaction.     BRABHAM IV, V. WELLS

## 2012-05-26 ENCOUNTER — Emergency Department (INDEPENDENT_AMBULATORY_CARE_PROVIDER_SITE_OTHER)
Admission: EM | Admit: 2012-05-26 | Discharge: 2012-05-26 | Disposition: A | Discharge status: Home / Self Care | Payer: Medicare Other | Source: Home / Self Care | Attending: Family Medicine | Admitting: Family Medicine

## 2012-05-26 ENCOUNTER — Encounter (HOSPITAL_COMMUNITY): Payer: Self-pay | Admitting: Emergency Medicine

## 2012-05-26 ENCOUNTER — Emergency Department (INDEPENDENT_AMBULATORY_CARE_PROVIDER_SITE_OTHER): Payer: Medicare Other

## 2012-05-26 DIAGNOSIS — S20229A Contusion of unspecified back wall of thorax, initial encounter: Secondary | ICD-10-CM

## 2012-05-26 NOTE — ED Provider Notes (Signed)
History     CSN: [id number]  Arrival date & time 05/26/12  1122   First MD Initiated Contact with Patient 05/26/12 1202      Chief Complaint  Patient presents with  . Tailbone Pain    (Consider location/radiation/quality/duration/timing/severity/associated sxs/prior treatment) Patient is a 78 y.o. male presenting with back pain. The history is provided by the patient and the spouse.  Back Pain  This is a new problem. The current episode started yesterday. The problem has not changed since onset.The pain is associated with falling (bent over and when he stood up he fell backward and struck head , no loc , and buttocks). The pain is present in the gluteal region. The quality of the pain is described as shooting. The pain does not radiate. The pain is moderate. Associated symptoms include pelvic pain. Pertinent negatives include no numbness, no paresthesias and no weakness.    Past Medical History  Diagnosis Date  . Hypertension   . CAD (coronary artery disease)   . Hyperlipidemia   . Gout   . Arthritis   . Anxiety   . Wears hearing aid   . Neuromuscular disorder     sciatica right leg  . Dialysis patient     M-W-F [address]  . Angina     takes imdur  . Chronic kidney disease     on Hemodialysis  . Cancer 2010    - monitoring- pt did not want tx  . Blood transfusion   . Myocardial infarction     X 4  . Dysrhythmia   . COPD (chronic obstructive pulmonary disease)   . Pneumonia 2009  . Anemia   . Renal insufficiency     Past Surgical History  Procedure Date  . Nose surgery     cauterized for numerous bleeds  . Kidney surgery   . Insertion of dialysis catheter   . US echocardiography   . Av fistula placement 07/11/2011    Procedure: ARTERIOVENOUS (AV) FISTULA CREATION;  Surgeon: Juleen China, MD;  Location: [facility] OR;  Service: Vascular;  Laterality: Left;  . Coronary angioplasty with stent placement 2010  . Diagnostic laparoscopy     remove scar tissue    . Eye surgery     right cataract surgery  . Cataract extraction w/phaco 08/14/2011    Procedure: CATARACT EXTRACTION PHACO AND INTRAOCULAR LENS PLACEMENT (IOC);  Surgeon: Chalmers Guest, MD;  Location[facility] OR;  Service: Ophthalmology;  Laterality: Left;    Family History  Problem Relation Age of Onset  . Heart disease Mother   . Hypertension Mother   . Heart disease Father   . COPD Father   . Hypertension Sister   . Heart disease Sister   . Anesthesia problems Neg Hx     History  Substance Use Topics  . Smoking status: Former Smoker -- 0 years    Types: Cigarettes    Quit date: 05/31/1963  . Smokeless tobacco: Former Neurosurgeon    Quit date: 04/07/1937  . Alcohol Use: No      Review of Systems  Constitutional: Negative.   Genitourinary: Positive for pelvic pain.  Musculoskeletal: Positive for back pain. Negative for gait problem.  Neurological: Negative for weakness, numbness and paresthesias.    Allergies  Caffeine; Ibuprofen; Pork-derived products; Shellfish allergy; and Tarka  Home Medications   Current Outpatient Rx  Name  Route  Sig  Dispense  Refill  . ASPIRIN EC 81 MG PO TBEC   Oral  Take 81 mg by mouth at bedtime. Does not take on Sunday's.         Marland Kitchen CLONIDINE HCL 0.2 MG PO TABS   Oral   Take 0.2 mg by mouth 2 (two) times daily.          Marland Kitchen CLOPIDOGREL BISULFATE 75 MG PO TABS   Oral   Take 75 mg by mouth daily.         . FUROSEMIDE 40 MG PO TABS   Oral   Take 40 mg by mouth 2 (two) times daily.          . ISOSORBIDE MONONITRATE ER 60 MG PO TB24   Oral   Take 60 mg by mouth 2 (two) times daily.          Marland Kitchen LISINOPRIL 20 MG PO TABS   Oral   Take 20 mg by mouth every evening.          Marland Kitchen LORAZEPAM 0.5 MG PO TABS   Oral   Take 0.5 mg by mouth daily as needed. For anxiety.         Marland Kitchen LOVASTATIN 20 MG PO TABS   Oral   Take 20 mg by mouth at bedtime.          Marland Kitchen MONTELUKAST SODIUM 10 MG PO TABS   Oral   Take 10 mg by mouth at bedtime.           . ALBUTEROL SULFATE HFA 108 (90 BASE) MCG/ACT IN AERS   Inhalation   Inhale 1-2 puffs into the lungs every 6 (six) hours as needed. For shortness of breath         . RENO CAPS PO   Oral   Take 1 capsule by mouth every evening.         Marland Kitchen NIFEDIPINE ER OSMOTIC 60 MG PO TB24   Oral   Take 60 mg by mouth daily.         Marland Kitchen NITROGLYCERIN 0.4 MG SL SUBL   Sublingual   Place 0.4 mg under the tongue every 5 (five) minutes as needed. For chest pain         . TRAMADOL HCL 50 MG PO TABS   Oral   Take 50 mg by mouth 2 (two) times daily as needed. For pain. Maximum dose= 8 tablets per day           BP 165/78  Pulse 59  Temp 97.5 F (36.4 C) (Oral)  Resp 18  SpO2 100%  Physical Exam  Nursing note and vitals reviewed. Constitutional: He is oriented to person, place, and time. He appears well-developed and well-nourished.  Abdominal: Soft. Bowel sounds are normal.  Musculoskeletal: He exhibits tenderness.       Back:  Neurological: He is alert and oriented to person, place, and time.  Skin: Skin is warm and dry.    ED Course  Procedures (including critical care time)  Labs Reviewed - No data to display Dg Pelvis 1-2 Views  05/26/2012  *RADIOLOGY REPORT*  Clinical Data: Fall, buttock pain  PELVIS - 1-2 VIEW  Comparison: None.  Findings: No fracture or dislocation is seen.  Visualized bony pelvis appears intact.  Bilateral hip joint spaces are mildly narrowed but symmetric.  Degenerative changes of the lower lumbar spine.  Moderate stool in the visualized colon.  IMPRESSION: No fracture or dislocation is seen.   Original Report Authenticated By: Charline Bills, M.D.      1. Back contusion  MDM  X-rays reviewed and report per radiologist.         Linna Hoff, MD 05/26/12 [phone]

## 2012-05-26 NOTE — ED Notes (Signed)
Pt states he fell around 20:00 last night... Fell on his glutaeus and since then, c/o bilateral pain on inferior glutaeus... Sx include: difficult to walk and pain when sitting down... Hit his back of head when he fell onto carpet... Denies: loss of conscious... Pt is alert w/no signs of distress.

## 2012-07-07 ENCOUNTER — Inpatient Hospital Stay (HOSPITAL_COMMUNITY): Payer: Medicare Other

## 2012-07-07 ENCOUNTER — Encounter (HOSPITAL_COMMUNITY): Payer: Self-pay | Admitting: *Deleted

## 2012-07-07 ENCOUNTER — Observation Stay (HOSPITAL_COMMUNITY): Payer: Medicare Other | Admitting: *Deleted

## 2012-07-07 ENCOUNTER — Encounter (HOSPITAL_COMMUNITY)
Admission: EM | Disposition: A | Discharge status: Home / Self Care | Payer: Self-pay | Source: Home / Self Care | Attending: Vascular Surgery

## 2012-07-07 ENCOUNTER — Encounter (HOSPITAL_COMMUNITY): Payer: Self-pay | Admitting: Nephrology

## 2012-07-07 ENCOUNTER — Telehealth: Payer: Self-pay | Admitting: Surgery

## 2012-07-07 ENCOUNTER — Observation Stay (HOSPITAL_COMMUNITY)
Admission: EM | Admit: 2012-07-07 | Discharge: 2012-07-08 | Disposition: A | Discharge status: Home / Self Care | Payer: Medicare Other | Attending: Nephrology | Admitting: Nephrology

## 2012-07-07 ENCOUNTER — Emergency Department (HOSPITAL_COMMUNITY): Payer: Medicare Other

## 2012-07-07 DIAGNOSIS — Y832 Surgical operation with anastomosis, bypass or graft as the cause of abnormal reaction of the patient, or of later complication, without mention of misadventure at the time of the procedure: Secondary | ICD-10-CM | POA: Insufficient documentation

## 2012-07-07 DIAGNOSIS — J449 Chronic obstructive pulmonary disease, unspecified: Secondary | ICD-10-CM | POA: Insufficient documentation

## 2012-07-07 DIAGNOSIS — T82898A Other specified complication of vascular prosthetic devices, implants and grafts, initial encounter: Principal | ICD-10-CM | POA: Insufficient documentation

## 2012-07-07 DIAGNOSIS — I120 Hypertensive chronic kidney disease with stage 5 chronic kidney disease or end stage renal disease: Secondary | ICD-10-CM | POA: Insufficient documentation

## 2012-07-07 DIAGNOSIS — Z79899 Other long term (current) drug therapy: Secondary | ICD-10-CM | POA: Insufficient documentation

## 2012-07-07 DIAGNOSIS — M7989 Other specified soft tissue disorders: Secondary | ICD-10-CM | POA: Insufficient documentation

## 2012-07-07 DIAGNOSIS — Z992 Dependence on renal dialysis: Secondary | ICD-10-CM | POA: Insufficient documentation

## 2012-07-07 DIAGNOSIS — J4489 Other specified chronic obstructive pulmonary disease: Secondary | ICD-10-CM | POA: Insufficient documentation

## 2012-07-07 DIAGNOSIS — E875 Hyperkalemia: Secondary | ICD-10-CM

## 2012-07-07 DIAGNOSIS — M79609 Pain in unspecified limb: Secondary | ICD-10-CM

## 2012-07-07 DIAGNOSIS — T82590A Other mechanical complication of surgically created arteriovenous fistula, initial encounter: Secondary | ICD-10-CM

## 2012-07-07 DIAGNOSIS — N186 End stage renal disease: Secondary | ICD-10-CM | POA: Insufficient documentation

## 2012-07-07 HISTORY — DX: Dependence on renal dialysis: N18.6

## 2012-07-07 HISTORY — DX: End stage renal disease: Z99.2

## 2012-07-07 HISTORY — PX: LIGATION OF ARTERIOVENOUS  FISTULA: SHX5948

## 2012-07-07 HISTORY — PX: INSERTION OF DIALYSIS CATHETER: SHX1324

## 2012-07-07 HISTORY — DX: Other specified disorders of adrenal gland: E27.8

## 2012-07-07 LAB — BASIC METABOLIC PANEL
BUN: 134 mg/dL — ABNORMAL HIGH (ref 6–23)
CO2: 24 mEq/L (ref 19–32)
CO2: 23 mEq/L (ref 19–32)
GFR calc non Af Amer: 3 mL/min — ABNORMAL LOW (ref >90)
GFR calc non Af Amer: 3 mL/min — ABNORMAL LOW (ref >90)
Glucose, Bld: 137 mg/dL — ABNORMAL HIGH (ref 70–99)
Glucose, Bld: 101 mg/dL — ABNORMAL HIGH (ref 70–99)
Potassium: 5.6 mEq/L — ABNORMAL HIGH (ref 3.5–5.1)
Potassium: 6 mEq/L — ABNORMAL HIGH (ref 3.5–5.1)
Sodium: 136 mEq/L (ref 135–145)

## 2012-07-07 LAB — CBC
Hemoglobin: 8.6 g/dL — ABNORMAL LOW (ref 13.0–17.0)
RBC: 2.92 MIL/uL — ABNORMAL LOW (ref 4.22–5.81)

## 2012-07-07 SURGERY — LIGATION OF ARTERIOVENOUS  FISTULA
Anesthesia: General | Site: Arm Upper | Laterality: Left | Wound class: Clean

## 2012-07-07 MED ORDER — NEPRO/CARBSTEADY PO LIQD: 237.0000 mL | ORAL | Status: DC | PRN

## 2012-07-07 MED ORDER — SODIUM CHLORIDE 0.9 % IV SOLN: 100.0000 mL | INTRAVENOUS | Status: DC | PRN

## 2012-07-07 MED ORDER — ACETAMINOPHEN 650 MG RE SUPP: 650.0000 mg | Freq: Four times a day (QID) | RECTAL | Status: DC | PRN

## 2012-07-07 MED ORDER — HYDROCODONE-ACETAMINOPHEN 5-325 MG PO TABS: 1.0000 | ORAL_TABLET | ORAL | Status: DC | PRN

## 2012-07-07 MED ORDER — HYDROMORPHONE HCL PF 1 MG/ML IJ SOLN: 0.2500 mg | INTRAMUSCULAR | Status: DC | PRN

## 2012-07-07 MED ORDER — ANTICOAGULANT SODIUM CITRATE 4% (200MG/5ML) IV SOLN
Status: DC | PRN
  Administered 2012-07-07: 4.6 mL via INTRAVENOUS

## 2012-07-07 MED ORDER — SODIUM CHLORIDE 0.9 % IV SOLN
INTRAVENOUS | Status: DC
  Administered 2012-07-07: 15:00:00 via INTRAVENOUS

## 2012-07-07 MED ORDER — DEXTROSE 5 % IV SOLN
1.5000 g | INTRAVENOUS | Status: DC
  Filled 2012-07-07: qty 1.5

## 2012-07-07 MED ORDER — LORAZEPAM 0.5 MG PO TABS
0.5000 mg | ORAL_TABLET | Freq: Four times a day (QID) | ORAL | Status: DC | PRN
  Administered 2012-07-08: 0.5 mg via ORAL
  Filled 2012-07-07: qty 1

## 2012-07-07 MED ORDER — SODIUM CHLORIDE 0.9 % IR SOLN
Status: DC | PRN
  Administered 2012-07-07: 500 mL

## 2012-07-07 MED ORDER — ACETAMINOPHEN 325 MG PO TABS: 650.0000 mg | ORAL_TABLET | Freq: Four times a day (QID) | ORAL | Status: DC | PRN

## 2012-07-07 MED ORDER — PROPOFOL INFUSION 10 MG/ML OPTIME
INTRAVENOUS | Status: DC | PRN
  Administered 2012-07-07: 75 ug/kg/min via INTRAVENOUS

## 2012-07-07 MED ORDER — SODIUM CHLORIDE 0.9 % IV SOLN
INTRAVENOUS | Status: DC | PRN
  Administered 2012-07-07 (×2): via INTRAVENOUS

## 2012-07-07 MED ORDER — LIDOCAINE-PRILOCAINE 2.5-2.5 % EX CREA: 1.0000 "application " | TOPICAL_CREAM | CUTANEOUS | Status: DC | PRN

## 2012-07-07 MED ORDER — ISOSORBIDE MONONITRATE ER 60 MG PO TB24
60.0000 mg | ORAL_TABLET | Freq: Two times a day (BID) | ORAL | Status: DC
  Administered 2012-07-07 – 2012-07-08 (×2): 60 mg via ORAL
  Filled 2012-07-07 (×3): qty 1

## 2012-07-07 MED ORDER — LIDOCAINE HCL (PF) 1 % IJ SOLN
INTRAMUSCULAR | Status: DC | PRN
  Administered 2012-07-07: 15 mL

## 2012-07-07 MED ORDER — LIDOCAINE HCL (PF) 1 % IJ SOLN: 5.0000 mL | INTRAMUSCULAR | Status: DC | PRN

## 2012-07-07 MED ORDER — DEXTROSE 5 % IV SOLN
1.5000 g | INTRAVENOUS | Status: DC | PRN
  Administered 2012-07-07: 1.5 g via INTRAVENOUS

## 2012-07-07 MED ORDER — MONTELUKAST SODIUM 10 MG PO TABS
10.0000 mg | ORAL_TABLET | Freq: Every day | ORAL | Status: DC
  Administered 2012-07-07: 10 mg via ORAL
  Filled 2012-07-07 (×2): qty 1

## 2012-07-07 MED ORDER — CLOPIDOGREL BISULFATE 75 MG PO TABS
75.0000 mg | ORAL_TABLET | Freq: Every day | ORAL | Status: DC
  Administered 2012-07-08: 75 mg via ORAL
  Filled 2012-07-07: qty 1

## 2012-07-07 MED ORDER — SORBITOL 70 % SOLN: 30.0000 mL | Freq: Every day | Status: DC | PRN

## 2012-07-07 MED ORDER — 0.9 % SODIUM CHLORIDE (POUR BTL) OPTIME
TOPICAL | Status: DC | PRN
  Administered 2012-07-07: 1000 mL

## 2012-07-07 MED ORDER — PENTAFLUOROPROP-TETRAFLUOROETH EX AERO: 1.0000 "application " | INHALATION_SPRAY | CUTANEOUS | Status: DC | PRN

## 2012-07-07 MED ORDER — ALTEPLASE 2 MG IJ SOLR
2.0000 mg | Freq: Once | INTRAMUSCULAR | Status: AC | PRN
  Filled 2012-07-07: qty 2

## 2012-07-07 MED ORDER — SIMVASTATIN 10 MG PO TABS
10.0000 mg | ORAL_TABLET | Freq: Every day | ORAL | Status: DC
  Filled 2012-07-07: qty 1

## 2012-07-07 MED ORDER — SODIUM CHLORIDE 0.9 % IJ SOLN: 3.0000 mL | Freq: Two times a day (BID) | INTRAMUSCULAR | Status: DC

## 2012-07-07 MED ORDER — DARBEPOETIN ALFA-POLYSORBATE 100 MCG/0.5ML IJ SOLN
100.0000 ug | Freq: Once | INTRAMUSCULAR | Status: DC
  Filled 2012-07-07: qty 0.5

## 2012-07-07 MED ORDER — HYDROCODONE-ACETAMINOPHEN 5-325 MG PO TABS
1.0000 | ORAL_TABLET | ORAL | Status: DC | PRN
  Administered 2012-07-07 – 2012-07-08 (×3): 2 via ORAL
  Filled 2012-07-07: qty 2

## 2012-07-07 MED ORDER — FENTANYL CITRATE 0.05 MG/ML IJ SOLN
INTRAMUSCULAR | Status: DC | PRN
  Administered 2012-07-07: 25 ug via INTRAVENOUS

## 2012-07-07 SURGICAL SUPPLY — 74 items
ADH SKN CLS APL DERMABOND .7 (GAUZE/BANDAGES/DRESSINGS) ×2
BAG BANDED W/RUBBER/TAPE 36X54 (MISCELLANEOUS) ×1 IMPLANT
BAG DECANTER FOR FLEXI CONT (MISCELLANEOUS) ×2 IMPLANT
BAG EQP BAND 135X91 W/RBR TAPE (MISCELLANEOUS) ×1
BANDAGE ELASTIC 6 VELCRO ST LF (GAUZE/BANDAGES/DRESSINGS) ×1 IMPLANT
BANDAGE GAUZE ELAST BULKY 4 IN (GAUZE/BANDAGES/DRESSINGS) ×1 IMPLANT
CANISTER SUCTION 2500CC (MISCELLANEOUS) ×2 IMPLANT
CATH CANNON HEMO 15F 50CM (CATHETERS) IMPLANT
CATH CANNON HEMO 15FR 19 (HEMODIALYSIS SUPPLIES) IMPLANT
CATH CANNON HEMO 15FR 23CM (HEMODIALYSIS SUPPLIES) ×1 IMPLANT
CATH CANNON HEMO 15FR 31CM (HEMODIALYSIS SUPPLIES) IMPLANT
CATH CANNON HEMO 15FR 32 (HEMODIALYSIS SUPPLIES) IMPLANT
CATH CANNON HEMO 15FR 32CM (HEMODIALYSIS SUPPLIES) IMPLANT
CHLORAPREP W/TINT 26ML (MISCELLANEOUS) ×2 IMPLANT
CLOTH BEACON ORANGE TIMEOUT ST (SAFETY) ×2 IMPLANT
COVER DOME SNAP 22 D (MISCELLANEOUS) ×1 IMPLANT
COVER PROBE W GEL 5X96 (DRAPES) IMPLANT
COVER SURGICAL LIGHT HANDLE (MISCELLANEOUS) ×2 IMPLANT
DECANTER SPIKE VIAL GLASS SM (MISCELLANEOUS) ×2 IMPLANT
DERMABOND ADVANCED (GAUZE/BANDAGES/DRESSINGS) ×2
DERMABOND ADVANCED .7 DNX12 (GAUZE/BANDAGES/DRESSINGS) ×1 IMPLANT
DRAPE C-ARM 42X72 X-RAY (DRAPES) ×2 IMPLANT
DRAPE CHEST BREAST 15X10 FENES (DRAPES) ×3 IMPLANT
ELECT REM PT RETURN 9FT ADLT (ELECTROSURGICAL) ×2
ELECTRODE REM PT RTRN 9FT ADLT (ELECTROSURGICAL) ×1 IMPLANT
GAUZE SPONGE 2X2 8PLY STRL LF (GAUZE/BANDAGES/DRESSINGS) ×1 IMPLANT
GAUZE SPONGE 4X4 16PLY XRAY LF (GAUZE/BANDAGES/DRESSINGS) ×2 IMPLANT
GEL ULTRASOUND 20GR AQUASONIC (MISCELLANEOUS) ×2 IMPLANT
GLOVE BIO SURGEON STRL SZ7.5 (GLOVE) ×3 IMPLANT
GLOVE BIOGEL PI IND STRL 6.5 (GLOVE) IMPLANT
GLOVE BIOGEL PI IND STRL 7.0 (GLOVE) IMPLANT
GLOVE BIOGEL PI IND STRL 7.5 (GLOVE) IMPLANT
GLOVE BIOGEL PI INDICATOR 6.5 (GLOVE) ×4
GLOVE BIOGEL PI INDICATOR 7.0 (GLOVE) ×1
GLOVE BIOGEL PI INDICATOR 7.5 (GLOVE) ×1
GLOVE ECLIPSE 6.5 STRL STRAW (GLOVE) ×1 IMPLANT
GLOVE SS BIOGEL STRL SZ 6.5 (GLOVE) IMPLANT
GLOVE SS BIOGEL STRL SZ 7 (GLOVE) IMPLANT
GLOVE SUPERSENSE BIOGEL SZ 6.5 (GLOVE) ×2
GLOVE SUPERSENSE BIOGEL SZ 7 (GLOVE) ×1
GOWN PREVENTION PLUS XLARGE (GOWN DISPOSABLE) ×2 IMPLANT
GOWN STRL NON-REIN LRG LVL3 (GOWN DISPOSABLE) ×6 IMPLANT
GOWN STRL REIN XL XLG (GOWN DISPOSABLE) ×4 IMPLANT
KIT BASIN OR (CUSTOM PROCEDURE TRAY) ×2 IMPLANT
KIT ROOM TURNOVER OR (KITS) ×2 IMPLANT
LOOP VESSEL MINI RED (MISCELLANEOUS) IMPLANT
NDL 18GX1X1/2 (RX/OR ONLY) (NEEDLE) ×1 IMPLANT
NDL HYPO 25GX1X1/2 BEV (NEEDLE) ×1 IMPLANT
NEEDLE 18GX1X1/2 (RX/OR ONLY) (NEEDLE) ×2 IMPLANT
NEEDLE HYPO 25GX1X1/2 BEV (NEEDLE) ×2 IMPLANT
NS IRRIG 1000ML POUR BTL (IV SOLUTION) ×2 IMPLANT
PACK CV ACCESS (CUSTOM PROCEDURE TRAY) ×2 IMPLANT
PACK SURGICAL SETUP 50X90 (CUSTOM PROCEDURE TRAY) ×2 IMPLANT
PAD ARMBOARD 7.5X6 YLW CONV (MISCELLANEOUS) ×4 IMPLANT
SPONGE GAUZE 2X2 STER 10/PKG (GAUZE/BANDAGES/DRESSINGS) ×1
SPONGE GAUZE 4X4 12PLY (GAUZE/BANDAGES/DRESSINGS) ×1 IMPLANT
SPONGE SURGIFOAM ABS GEL 100 (HEMOSTASIS) IMPLANT
SUT ETHILON 3 0 PS 1 (SUTURE) ×2 IMPLANT
SUT PROLENE 6 0 CC (SUTURE) IMPLANT
SUT SILK 0 (SUTURE) IMPLANT
SUT SILK 0 TIES 10X30 (SUTURE) ×1 IMPLANT
SUT VIC AB 3-0 SH 27 (SUTURE) ×4
SUT VIC AB 3-0 SH 27X BRD (SUTURE) ×1 IMPLANT
SUT VICRYL 4-0 PS2 18IN ABS (SUTURE) ×3 IMPLANT
SYR 20CC LL (SYRINGE) ×5 IMPLANT
SYR 30ML LL (SYRINGE) IMPLANT
SYR 5ML LL (SYRINGE) ×4 IMPLANT
SYR CONTROL 10ML LL (SYRINGE) ×2 IMPLANT
SYRINGE 10CC LL (SYRINGE) ×2 IMPLANT
TOWEL OR 17X24 6PK STRL BLUE (TOWEL DISPOSABLE) ×2 IMPLANT
TOWEL OR 17X26 10 PK STRL BLUE (TOWEL DISPOSABLE) ×2 IMPLANT
UNDERPAD 30X30 INCONTINENT (UNDERPADS AND DIAPERS) ×2 IMPLANT
WATER STERILE IRR 1000ML POUR (IV SOLUTION) ×2 IMPLANT
YANKAUER SUCT BULB TIP NO VENT (SUCTIONS) ×1 IMPLANT

## 2012-07-07 NOTE — ED Notes (Signed)
Pt was told to come to ED for further evaluation of Left arm swelling. Dr.McMannus in room to see pt

## 2012-07-07 NOTE — ED Provider Notes (Signed)
History     CSN: [id number]  Arrival date & time 07/07/12  1029   First MD Initiated Contact with Patient 07/07/12 1118      Chief Complaint  Patient presents with  . Arm Pain  . Edema    (Consider location/radiation/quality/duration/timing/severity/associated sxs/prior treatment) HPI Comments: [name] is a 78 y.o. male with a history of CAD, hypertension, chronic kidney disease (Monday Wednesday Friday dialysis) and COPD that presents emergency department complaining of left upper arm fistula swelling.  Patient has been unable to receive full dialysis for the last 4 visits and the area over his fistula has been gradually swelling with a more progressive swelling of the most recent dialysis yesterday. This site has had a previous infiltration w formed pseudoaneurysm  that was corrected by pts vascular surgeon Dr. Myra Gianotti on Sept, 25, 2013. Pt denies any worsened leg swelling, SOB, OND, orthopnea, CP, fevers, night sweats or chills. He states he chronically has DOE & SOB.   Patient is a 78 y.o. male presenting with arm pain. The history is provided by the patient and medical records.  Arm Pain Pertinent negatives include no congestion, coughing, diaphoresis, fever, headaches, myalgias or neck pain.    Past Medical History  Diagnosis Date  . Hypertension   . CAD (coronary artery disease)   . Hyperlipidemia   . Gout   . Arthritis   . Anxiety   . Wears hearing aid   . Neuromuscular disorder     sciatica right leg  . Dialysis patient     M-W-F [address]  . Angina     takes imdur  . Chronic kidney disease     on Hemodialysis  . Cancer 2010    - monitoring- pt did not want tx  . Blood transfusion   . Myocardial infarction     X 4  . Dysrhythmia   . COPD (chronic obstructive pulmonary disease)   . Pneumonia 2009  . Anemia   . Renal insufficiency     Past Surgical History  Procedure Date  . Nose surgery     cauterized for numerous bleeds  . Kidney  surgery   . Insertion of dialysis catheter   . US echocardiography   . Av fistula placement 07/11/2011    Procedure: ARTERIOVENOUS (AV) FISTULA CREATION;  Surgeon: Juleen China, MD;  Location: [facility] OR;  Service: Vascular;  Laterality: Left;  . Coronary angioplasty with stent placement 2010  . Diagnostic laparoscopy     remove scar tissue  . Eye surgery     right cataract surgery  . Cataract extraction w/phaco 08/14/2011    Procedure: CATARACT EXTRACTION PHACO AND INTRAOCULAR LENS PLACEMENT (IOC);  Surgeon: Chalmers Guest, MD;  Location: [facility] OR;  Service: Ophthalmology;  Laterality: Left;    Family History  Problem Relation Age of Onset  . Heart disease Mother   . Hypertension Mother   . Heart disease Father   . COPD Father   . Hypertension Sister   . Heart disease Sister   . Anesthesia problems Neg Hx     History  Substance Use Topics  . Smoking status: Former Smoker -- 0 years    Types: Cigarettes    Quit date: 05/31/1963  . Smokeless tobacco: Former Neurosurgeon    Quit date: 04/07/1937  . Alcohol Use: No      Review of Systems  Constitutional: Negative for fever, diaphoresis and activity change.  HENT: Negative for congestion and neck pain.  Respiratory: Negative for cough.   Genitourinary: Negative for dysuria.  Musculoskeletal: Negative for myalgias.  Skin: Negative for color change and wound.  Neurological: Negative for headaches.  All other systems reviewed and are negative.    Allergies  Caffeine; Ibuprofen; Pork-derived products; Shellfish allergy; and Tarka  Home Medications   Current Outpatient Rx  Name  Route  Sig  Dispense  Refill  . ALBUTEROL SULFATE HFA 108 (90 BASE) MCG/ACT IN AERS   Inhalation   Inhale 1-2 puffs into the lungs every 6 (six) hours as needed. For shortness of breath         . ASPIRIN EC 81 MG PO TBEC   Oral   Take 81 mg by mouth at bedtime. Does not take on Sunday's.         Marland Kitchen RENO CAPS PO   Oral   Take 1 capsule by mouth  every evening.         Marland Kitchen CLONIDINE HCL 0.2 MG PO TABS   Oral   Take 0.2 mg by mouth 2 (two) times daily.          Marland Kitchen CLOPIDOGREL BISULFATE 75 MG PO TABS   Oral   Take 75 mg by mouth daily.         . FUROSEMIDE 40 MG PO TABS   Oral   Take 40 mg by mouth 2 (two) times daily.          . ISOSORBIDE MONONITRATE ER 60 MG PO TB24   Oral   Take 60 mg by mouth 2 (two) times daily.          Marland Kitchen LISINOPRIL 20 MG PO TABS   Oral   Take 20 mg by mouth every evening.          Marland Kitchen LORAZEPAM 0.5 MG PO TABS   Oral   Take 0.5 mg by mouth daily as needed. For anxiety.         Marland Kitchen LOVASTATIN 20 MG PO TABS   Oral   Take 20 mg by mouth at bedtime.          Marland Kitchen MONTELUKAST SODIUM 10 MG PO TABS   Oral   Take 10 mg by mouth at bedtime.          Marland Kitchen NIFEDIPINE ER OSMOTIC 60 MG PO TB24   Oral   Take 60 mg by mouth daily.         Marland Kitchen NITROGLYCERIN 0.4 MG SL SUBL   Sublingual   Place 0.4 mg under the tongue every 5 (five) minutes as needed. For chest pain         . TRAMADOL HCL 50 MG PO TABS   Oral   Take 50 mg by mouth 2 (two) times daily as needed. For pain. Maximum dose= 8 tablets per day           BP 167/89  Pulse 73  Temp 97.6 F (36.4 C) (Oral)  Resp 18  Ht 6\' 2"  (1.88 m)  Wt 281 lb (127.461 kg)  BMI 36.08 kg/m2  SpO2 99%  Physical Exam  Nursing note and vitals reviewed. Constitutional: He is oriented to person, place, and time. He appears well-developed and well-nourished. No distress.  HENT:  Head: Normocephalic and atraumatic.  Eyes: Conjunctivae normal and EOM are normal.  Neck: Normal range of motion.  Cardiovascular:       RRR, no pitting edema bilaterally  Pulmonary/Chest: Effort normal.       LCAB, no crackles. Normal  effort   Musculoskeletal: Normal range of motion.  Neurological: He is alert and oriented to person, place, and time.  Skin: Skin is warm and dry. No rash noted. He is not diaphoretic.       Left upper extremity with significant  swelling over graft site.   Psychiatric: He has a normal mood and affect. His behavior is normal.    ED Course  Procedures (including critical care time)  Labs Reviewed  CBC - Abnormal; Notable for the following:    RBC 2.92 (*)     Hemoglobin 8.6 (*)     HCT 24.4 (*)     RDW 17.2 (*)     All other components within normal limits  BASIC METABOLIC PANEL - Abnormal; Notable for the following:    Potassium 5.6 (*)     Chloride 89 (*)     Glucose, Bld 137 (*)     BUN 132 (*)     Creatinine, Ser 14.24 (*)     GFR calc non Af Amer 3 (*)     GFR calc Af Amer 3 (*)     All other components within normal limits   Dg Chest 2 View  07/07/2012  *RADIOLOGY REPORT*  Clinical Data: Cough and short of breath  CHEST - 2 VIEW  Comparison: Chest CT and chest radiograph, 01/20/2012  Findings: Mass-like opacity extends posterior and lateral to the left hilum.  There is blunting of the left hemidiaphragm consistent with a small left pleural effusion.  These findings are stable from the prior exam.  The right lung is mildly hyperexpanded, but clear. The cardiac silhouette is mildly enlarged.  No mediastinal or hilar masses or adenopathy.  IMPRESSION: No acute findings.  Stable left perihilar opacity that projects in the left posterior lower lobe.  This has a mass-like appearance, but is stable.  No acute cardiopulmonary disease.   Original Report Authenticated By: Amie Portland, M.D.    Date: 07/07/2012  Rate: 66  Rhythm: normal sinus rhythm  QRS Axis: normal  Intervals: normal  ST/T Wave abnormalities: normal  Conduction Disutrbances:Borderline AV conduction delay  Narrative Interpretation: No PEAK T WAVES  Old EKG Reviewed: no significant changes seen   No diagnosis found.  Vascular report: multi chamber pseudoaneurysm partially thrombosed, different from prior.   Consult: Vascular to see in ED, ligate the fistula and insert ditek catheter Consult: Nephrology- discussed bump in crt, Plan is to  have pt go to dialysis with likely discharge home after vascular gets access. Medical treatment for hyperkalemia not required at this time.   MDM  Dialysis AV fistula malfunction with consults at above. The patient appears reasonably stabilized for transfer to the OR considering the current resources, flow, and capabilities available in the ED at this time, and I doubt any other [facility] requiring further screening and/or treatment in the ED prior to admission.              Jaci Carrel, [place] 07/07/12 1357

## 2012-07-07 NOTE — Anesthesia Preprocedure Evaluation (Addendum)
Anesthesia Evaluation  Patient identified by MRN, date of birth, ID band Patient awake    Reviewed: Allergy & Precautions, H&P , NPO status , Patient's Chart, lab work & pertinent test results  Airway Mallampati: III TM Distance: >3 FB Neck ROM: Limited    Dental No notable dental hx. (+) Dental Advisory Given and Edentulous Upper   Pulmonary neg pulmonary ROS, shortness of breath and with exertion, pneumonia -, resolved, COPD COPD inhaler,  breath sounds clear to auscultation  Pulmonary exam normal       Cardiovascular hypertension, Pt. on medications + CAD, + Past MI and + Cardiac Stents negative cardio ROS  Rhythm:Regular Rate:Normal + Systolic murmurs    Neuro/Psych negative neurological ROS  negative psych ROS   GI/Hepatic negative GI ROS, Neg liver ROS,   Endo/Other  negative endocrine ROS  Renal/GU Dialysis and ESRFRenal diseasenegative Renal ROS  negative genitourinary   Musculoskeletal   Abdominal Normal abdominal exam  (+)   Peds  Hematology negative hematology ROS (+)   Anesthesia Other Findings   Reproductive/Obstetrics negative OB ROS                          Anesthesia Physical Anesthesia Plan  ASA: III  Anesthesia Plan: MAC   Post-op Pain Management:    Induction: Intravenous  Airway Management Planned: Simple Face Mask  Additional Equipment:   Intra-op Plan:   Post-operative Plan:   Informed Consent: I have reviewed the patients History and Physical, chart, labs and discussed the procedure including the risks, benefits and alternatives for the proposed anesthesia with the patient or authorized representative who has indicated his/her understanding and acceptance.   Dental advisory given  Plan Discussed with: Anesthesiologist and Surgeon  Anesthesia Plan Comments:        Anesthesia Quick Evaluation

## 2012-07-07 NOTE — Preoperative (Signed)
Beta Blockers   Reason not to administer Beta Blockers:Not Applicable 

## 2012-07-07 NOTE — Progress Notes (Signed)
Report to Olegario Messier, RN (PACU)

## 2012-07-07 NOTE — Progress Notes (Signed)
Xray done on chest and left arm for foreign body

## 2012-07-07 NOTE — Telephone Encounter (Addendum)
Message copied by Shari Prows on Tue Jul 07, 2012  4:57 PM ------      Message from: Melene Plan      Created: Tue Jul 07, 2012  4:43 PM                   ----- Message -----         From: Lars Mage, PA         Sent: 07/07/2012   3:54 PM           To: Melene Plan, RN            F/U with Dr. Sondra Barges- or Myra Gianotti for new fistula creation.  S/P ligation left upper arm AV fistula diatek placement.  2-3 weeks.  Needs right arm vein mapping.  I scheduled an appt for the above pt on 08/03/12 at 2:30pm for lab and to see VWB at 3:45pm. I mailed an appt letter and lm for pt also.awt

## 2012-07-07 NOTE — Progress Notes (Signed)
Tip of hemo catheter in right atrium

## 2012-07-07 NOTE — ED Notes (Signed)
Bruit can be heard at distal end of upper arm. Not heard or felt over swelling in mid upper arm

## 2012-07-07 NOTE — Progress Notes (Signed)
*  Preliminary Results* Left upper extremity venous duplex completed. Left upper extremity is negative for deep vein thrombosis involving the left internal jugular, subclavian, axillary, and brachial veins. A left arteriovenous fistula is seen and appears to be patent with a large adjacent area resembling a partially thrombosed multichamber pseudoaneurysm with its largest dimension measuring 6.6cm.  07/07/2012 1:13 PM Gertie Fey, RDMS, RDCS

## 2012-07-07 NOTE — Progress Notes (Signed)
Chest xray no pneumothorax and no foreign bodies in left Arm , hemodialysis  Called they are  unable to take patient due to an emergency treatment patient is going to be admitted to room to wait for hemodialysis treatment per nephrologist

## 2012-07-07 NOTE — Op Note (Signed)
Procedure: Ligation left arm AV fistula, evacuation hematoma left arm, Ultrasound-guided insertion of Diatek catheter  Preoperative diagnosis: 1.  Expanding hematoma left arm 2. End-stage renal disease  Postoperative diagnosis: Same  Anesthesia: Local with IV sedation  Operative findings: 23 cm Diatek catheter right internal jugular vein  Operative details: After obtaining informed consent, the patient was taken to the operating room. The patient was placed in supine position on the operating room table. After adequate sedation, the patient's entire left upper extremity is prepped and draped in usual sterile fashion. Local anesthesia was infiltrated near the antecubital crease. A transverse incision was made in this location and carried into the subcutaneous tissues down to level of the AV fistula. The fistula was dissected free circumferentially. The fistula was then ligated twice with a 0 silk tie. There is no further flow distally. At this point due to the pressure in the patient's left upper arm and skin compromise I elected to evacuate the hematoma. A longitudinal incision was made over the midportion of the hematoma. This was carried into the subcutaneous tissues down to the hematoma cavity. There was a large amount of blood in the hematoma cavity and this was all thoroughly irrigated and removed completely. There is no active bleeding at this point. Subcutaneous tissues of both incisions were reapproximated using running 3-0 Vicryl suture. The skin of both incisions was closed with a 4 0 Vicryl subcuticular stitch. The patient tolerated this portion of the procedure well.   Next, the patient's entire neck and chest were prepped and draped in usual sterile fashion. The patient was placed in Trendelenburg position. Ultrasound was used to identify the patient's  internal jugular vein. This had normal compressibility and respiratory variation. Local anesthesia was infiltrated over the right jugular  vein.  Using ultrasound guidance, the right internal jugular vein was successfully cannulated.  A 0.035 J-tipped guidewire was threaded into the right internal jugular vein and into the superior vena cava followed by the inferior vena cava under fluoroscopic guidance.   Next sequential 12 arightnd 14 dilators were placed over the guidewire into the right atrium.  A 16 French dilator with a peel-away sheath was then placed over the guidewire into the right atrium.   The guidewire and dilator were removed. A 23 cm Diatek catheter was then placed through the peel away sheath into the right atrium.  The catheter was then tunneled subcutaneously, cut to length, and the hub attached. The catheter was noted to flush and draw easily. The catheter was inspected under fluoroscopy and found with its tip to be in the right atrium without any kinks throughout its course. The catheter was sutured to the skin with nylon sutures. The neck insertion site was closed with Vicryl stitch. The catheter was then loaded with concentrated Heparin solution. A dry sterile dressing was applied.  The patient tolerated procedure well and there were no complications. Instrument sponge and needle counts were correct at the end of the case. The patient was taken to the recovery room in stable condition. Chest x-ray will be obtained in the recovery room.  Derrick Bruns, MD [facility] Office: [phone] Pager: [phone]

## 2012-07-07 NOTE — Consult Note (Addendum)
[facility] [place] CONSULT NOTE 07/07/2012 DOB: [id number] MRN : [id number]  WG:NFAO AV fistula  Malfunction. Referring Physician:ED  History of Present Illness: The patient presents today for evaluation of left upper arm AV fistula. He has had this placed by Dr. Myra Gianotti in December of 2012. He had a diminished flow through this and had a fistulogram and venous angioplasty by Dr. Myra Gianotti in July. His operative report he reports this is dilated to 4 mm and could be redilated of 5 mm of continued to have difficulty. He subsequently travel to [place] on a trip and had difficult access there and had a dialysis catheter placed for hemodialysis. He reports that there was a new staff member at the dialysis center infiltrated the fistula on the first attempt. He is seen today for further evaluation. Since returning to [facility] he has been using the fistula and the catheter has been removed.  Over the last week they have done partial dialysis secondary malfunctioning of the fistula.    Past Medical History  Diagnosis Date  . Hypertension   . CAD (coronary artery disease)   . Hyperlipidemia   . Gout   . Arthritis   . Anxiety   . Wears hearing aid   . Neuromuscular disorder     sciatica right leg  . Dialysis patient     M-W-F [address]  . Angina     takes imdur  . Chronic kidney disease     on Hemodialysis  . Cancer 2010    - monitoring- pt did not want tx  . Blood transfusion   . Myocardial infarction     X 4  . Dysrhythmia   . COPD (chronic obstructive pulmonary disease)   . Pneumonia 2009  . Anemia   . Renal insufficiency     Past Surgical History  Procedure Date  . Nose surgery     cauterized for numerous bleeds  . Kidney surgery   . Insertion of dialysis catheter   . US echocardiography   . Av fistula placement 07/11/2011    Procedure: ARTERIOVENOUS (AV) FISTULA CREATION;  Surgeon: Juleen China, MD;  Location: [facility] OR;  Service: Vascular;   Laterality: Left;  . Coronary angioplasty with stent placement 2010  . Diagnostic laparoscopy     remove scar tissue  . Eye surgery     right cataract surgery  . Cataract extraction w/phaco 08/14/2011    Procedure: CATARACT EXTRACTION PHACO AND INTRAOCULAR LENS PLACEMENT (IOC);  Surgeon: Chalmers Guest, MD;  Location: [facility] OR;  Service: Ophthalmology;  Laterality: Left;     ROS: [x]  Positive  [ ]  Denies    General: [ ]  Weight loss, [ ]  Fever, [ ]  chills Neurologic: [ ]  Dizziness, [ ]  Blackouts, [ ]  Seizure [ ]  Stroke, [ ]  "Mini stroke", [ ]  Slurred speech, [ ]  Temporary blindness; [ ]  weakness in arms or legs, [ ]  Hoarseness Cardiac: [ ]  Chest pain/pressure, [ ]  Shortness of breath at rest [ ]  Shortness of breath with exertion, [ ]  Atrial fibrillation or irregular heartbeat Vascular: [ ]  Pain in legs with walking, [ ]  Pain in legs at rest, [ ]  Pain in legs at night,  [ ]  Non-healing ulcer, [ ]  Blood clot in vein/DVT,   Pulmonary: [ ]  Home oxygen, [ ]  Productive cough, [ ]  Coughing up blood, [ ]  Asthma,  [ ]  Wheezing Musculoskeletal:  [ ]  Arthritis, [ ]  Low back pain, [ ]  Joint pain Hematologic: [ ]   Easy Bruising, [ ]  Anemia; [ ]  Hepatitis Gastrointestinal: [ ]  Blood in stool, [ ]  Gastroesophageal Reflux/heartburn, [ ]  Trouble swallowing Urinary: [x ] chronic Kidney disease, [x ] on HD - [x ] MWF or [ ]  TTHS, [ ]  Burning with urination, [ ]  Difficulty urinating Skin: [ ]  Rashes, [ ]  Wounds Left upper arm swelling  Psychological: [ ]  Anxiety, [ ]  Depression  Social History History  Substance Use Topics  . Smoking status: Former Smoker -- 0 years    Types: Cigarettes    Quit date: 05/31/1963  . Smokeless tobacco: Former Neurosurgeon    Quit date: 04/07/1937  . Alcohol Use: No    Family History Family History  Problem Relation Age of Onset  . Heart disease Mother   . Hypertension Mother   . Heart disease Father   . COPD Father   . Hypertension Sister   . Heart disease Sister   .  Anesthesia problems Neg Hx     Allergies  Allergen Reactions  . Caffeine     Seventh day advantist  . Ibuprofen Other (See Comments)    Messed up kidneys  . Pork-Derived Products     Seventh day advantist  . Shellfish Allergy     Seventh day advantist  . Tarka (Trandolapril-Verapamil Hcl Er) Cough    No current facility-administered medications for this encounter.   Current Outpatient Prescriptions  Medication Sig Dispense Refill  . albuterol (PROVENTIL HFA;VENTOLIN HFA) 108 (90 BASE) MCG/ACT inhaler Inhale 1-2 puffs into the lungs every 6 (six) hours as needed. For shortness of breath      . aspirin EC 81 MG tablet Take 81 mg by mouth at bedtime. Does not take on Sunday's.      . B Complex-C-Folic Acid (RENO CAPS PO) Take 1 capsule by mouth every evening.      . cloNIDine (CATAPRES) 0.2 MG tablet Take 0.2 mg by mouth 2 (two) times daily.       . clopidogrel (PLAVIX) 75 MG tablet Take 75 mg by mouth daily.      . furosemide (LASIX) 40 MG tablet Take 40 mg by mouth 2 (two) times daily.       . isosorbide mononitrate (IMDUR) 60 MG 24 hr tablet Take 60 mg by mouth 2 (two) times daily.       Marland Kitchen lisinopril (PRINIVIL,ZESTRIL) 20 MG tablet Take 20 mg by mouth every evening.       Marland Kitchen LORazepam (ATIVAN) 0.5 MG tablet Take 0.5 mg by mouth daily as needed. For anxiety.      . lovastatin (MEVACOR) 20 MG tablet Take 20 mg by mouth at bedtime.       . montelukast (SINGULAIR) 10 MG tablet Take 10 mg by mouth at bedtime.       Marland Kitchen NIFEdipine (NIFEDICAL XL) 60 MG 24 hr tablet Take 60 mg by mouth daily.      . nitroGLYCERIN (NITROSTAT) 0.4 MG SL tablet Place 0.4 mg under the tongue every 5 (five) minutes as needed. For chest pain      . traMADol (ULTRAM) 50 MG tablet Take 50 mg by mouth 2 (two) times daily as needed. For pain. Maximum dose= 8 tablets per day         Imaging: Dg Chest 2 View  07/07/2012  *RADIOLOGY REPORT*  Clinical Data: Cough and short of breath  CHEST - 2 VIEW  Comparison: Chest  CT and chest radiograph, 01/20/2012  Findings: Mass-like opacity extends posterior and lateral  to the left hilum.  There is blunting of the left hemidiaphragm consistent with a small left pleural effusion.  These findings are stable from the prior exam.  The right lung is mildly hyperexpanded, but clear. The cardiac silhouette is mildly enlarged.  No mediastinal or hilar masses or adenopathy.  IMPRESSION: No acute findings.  Stable left perihilar opacity that projects in the left posterior lower lobe.  This has a mass-like appearance, but is stable.  No acute cardiopulmonary disease.   Original Report Authenticated By: Amie Portland, M.D.     Significant Diagnostic Studies: CBC Lab Results  Component Value Date   WBC 8.8 07/07/2012   HGB 8.6* 07/07/2012   HCT 24.4* 07/07/2012   MCV 83.6 07/07/2012   PLT 162 07/07/2012    BMET    Component Value Date/Time   NA 136 07/07/2012 1159   K 5.6* 07/07/2012 1159   CL 89* 07/07/2012 1159   CO2 24 07/07/2012 1159   GLUCOSE 137* 07/07/2012 1159   BUN 132* 07/07/2012 1159   CREATININE 14.24* 07/07/2012 1159   CALCIUM 10.2 07/07/2012 1159   GFRNONAA 3* 07/07/2012 1159   GFRAA 3* 07/07/2012 1159    COAG Lab Results  Component Value Date   INR 1.26 01/20/2012   INR 1.20 07/11/2011   INR 1.1 02/17/2007   No results found for this basename: PTT     Physical Examination BP Readings from Last 3 Encounters:  07/07/12 167/89  05/26/12 165/78  04/15/12 167/83   Temp Readings from Last 3 Encounters:  07/07/12 97.6 F (36.4 C) Oral  05/26/12 97.5 F (36.4 C) Oral  04/15/12 97.3 F (36.3 C) Oral   SpO2 Readings from Last 3 Encounters:  07/07/12 99%  05/26/12 100%  04/15/12 100%   Pulse Readings from Last 3 Encounters:  07/07/12 73  05/26/12 59  04/15/12 62    General:  WDWN in NAD Gait: Normal HENT: WNL Eyes: Pupils equal Pulmonary: normal non-labored breathing , without Rales, rhonchi,  wheezing Cardiac: RRR, without   Murmurs, rubs or gallops; Abdomen: soft, NT, no masses Skin: no rashes, ulcers noted Vascular Exam/Pulses:palpable left radial pulse.  No signs of steal Extremities without ischemic changes, no Gangrene , no cellulitis; no open wounds;  Musculoskeletal: no muscle wasting or atrophy  Neurologic: A&O X 3; Appropriate Affect ;  SENSATION: normal; MOTOR FUNCTION: Pt has good and equal strength in all extremities - 5/5 Speech is fluent/normal    ASSESSMENT/PLAN: Pseudoaneurysm infiltration Ligate fistula place diatek.   COLLINS, EMMA MAUREEN  History and exam as above 10 x 12 cm mass over left biceps Pt with expanding pseudoaneurysm left arm AV fistula.  This is not salvageable. Plan for ligation of fistula and diatek today.  Most likely Dr Arlean Hopping will dialyze pt post op for hyperkalemia  Fabienne Bruns, MD [facility] [place] Office: [phone] Pager: [phone]

## 2012-07-07 NOTE — Anesthesia Postprocedure Evaluation (Signed)
  Anesthesia Post-op Note  Patient: [name]  Procedure(s) Performed: Procedure(s) (LRB) with comments: LIGATION OF ARTERIOVENOUS  FISTULA (Left) INSERTION OF DIALYSIS CATHETER (Left) - Ultrasound guided  Patient Location: PACU  Anesthesia Type:General  Level of Consciousness: awake, oriented and patient cooperative  Airway and Oxygen Therapy: Patient Spontanous Breathing  Post-op Pain: mild  Post-op Assessment: Post-op Vital signs reviewed, Patient's Cardiovascular Status Stable, Respiratory Function Stable, Patent Airway, No signs of Nausea or vomiting and Pain level controlled  Post-op Vital Signs: stable  Complications: No apparent anesthesia complications

## 2012-07-07 NOTE — H&P (Signed)
[name] is an 78 y.o. male.  Chief Complaint: Hyperkalemia HPI: 78 yo AAM w hx of HTN, CAD, gout and ESRD on HD MWF presented to ED with swelling over his AV fistula and a history of poor dialysis recently due to AV fistula malfunction. Was seen in ED by Dr Darrick Penna who diagnosed an expanding mass over left biceps c/w pseudoanurysm with bleeding/infiltration. Deemed not to be salvageable and pt was taken to OR today for ligation of AVF, evac of hematoma and placement of tunneled HD catheter which went without difficulty. Post op pt is hungry without complaints. Denies any active or recent SOB, CP, fevers, chills, n/v/d, abd pain, dysuria, joint pains, HA or focal weakness.   Etiology of renal failure was related to severe obstructive nephropathy.  He had bilat PCN, then internal stents and eventually in 2003 underwent bilat ureterolysis with omental wrap, done for retroperitoneal fibrosis, by Dr. Patsi Sears.  Kidneys eventually failed and he started HD in June 2012.   ROS  negative   Past Medical History:  Past Medical History  Diagnosis Date  . Hypertension   . CAD (coronary artery disease)   . Hyperlipidemia   . Gout   . Arthritis   . Anxiety   . Wears hearing aid   . Neuromuscular disorder     sciatica right leg  . Dialysis patient     M-W-F [address]  . Angina     takes imdur  . Chronic kidney disease     on Hemodialysis  . Cancer 2010    - monitoring- pt did not want tx  . Blood transfusion   . Myocardial infarction     X 4  . Dysrhythmia   . COPD (chronic obstructive pulmonary disease)   . Pneumonia 2009  . Anemia   . Renal insufficiency     Past Surgical History:  Past Surgical History  Procedure Date  . Nose surgery     cauterized for numerous bleeds  . Kidney surgery   . Insertion of dialysis catheter   . US echocardiography   . Av fistula placement 07/11/2011    Procedure: ARTERIOVENOUS (AV) FISTULA CREATION;  Surgeon: Juleen China, MD;   Location: [facility] OR;  Service: Vascular;  Laterality: Left;  . Coronary angioplasty with stent placement 2010  . Diagnostic laparoscopy     remove scar tissue  . Eye surgery     right cataract surgery  . Cataract extraction w/phaco 08/14/2011    Procedure: CATARACT EXTRACTION PHACO AND INTRAOCULAR LENS PLACEMENT (IOC);  Surgeon: Chalmers Guest, MD;  Location: [facility] OR;  Service: Ophthalmology;  Laterality: Left;    Family History:  Family History  Problem Relation Age of Onset  . Heart disease Mother   . Hypertension Mother   . Heart disease Father   . COPD Father   . Hypertension Sister   . Heart disease Sister   . Anesthesia problems Neg Hx    Social History:  reports that he quit smoking about 49 years ago. His smoking use included Cigarettes. He quit after 0 years of use. He quit smokeless tobacco use about 75 years ago. He reports that he does not drink alcohol or use illicit drugs.  Allergies:  Allergies  Allergen Reactions  . Caffeine     Seventh day advantist  . Ibuprofen Other (See Comments)    Messed up kidneys  . Pork-Derived Products     Seventh day advantist  . Shellfish Allergy  Seventh day advantist  . Tarka (Trandolapril-Verapamil Hcl Er) Cough    Home medications: Prior to Admission medications   Medication Sig Start Date End Date Taking? Authorizing Provider  albuterol (PROVENTIL HFA;VENTOLIN HFA) 108 (90 BASE) MCG/ACT inhaler Inhale 1-2 puffs into the lungs every 6 (six) hours as needed. For shortness of breath 01/20/12 01/19/13  Glynn Octave, MD  aspirin EC 81 MG tablet Take 81 mg by mouth at bedtime. Does not take on Sunday's.    Historical Provider, MD  B Complex-C-Folic Acid (RENO CAPS PO) Take 1 capsule by mouth every evening.    Historical Provider, MD  cloNIDine (CATAPRES) 0.2 MG tablet Take 0.2 mg by mouth 2 (two) times daily.  07/11/11   Historical Provider, MD  clopidogrel (PLAVIX) 75 MG tablet Take 75 mg by mouth daily.    Historical Provider, MD   furosemide (LASIX) 40 MG tablet Take 40 mg by mouth 2 (two) times daily.     Historical Provider, MD  HYDROcodone-acetaminophen (NORCO/VICODIN) 5-325 MG per tablet Take 1-2 tablets by mouth every 4 (four) hours as needed for pain. 07/07/12   Lars Mage, PA  isosorbide mononitrate (IMDUR) 60 MG 24 hr tablet Take 60 mg by mouth 2 (two) times daily.     Historical Provider, MD  lisinopril (PRINIVIL,ZESTRIL) 20 MG tablet Take 20 mg by mouth every evening.     Historical Provider, MD  LORazepam (ATIVAN) 0.5 MG tablet Take 0.5 mg by mouth daily as needed. For anxiety.    Historical Provider, MD  lovastatin (MEVACOR) 20 MG tablet Take 20 mg by mouth at bedtime.     Historical Provider, MD  montelukast (SINGULAIR) 10 MG tablet Take 10 mg by mouth at bedtime.     Historical Provider, MD  NIFEdipine (NIFEDICAL XL) 60 MG 24 hr tablet Take 60 mg by mouth daily.    Historical Provider, MD  nitroGLYCERIN (NITROSTAT) 0.4 MG SL tablet Place 0.4 mg under the tongue every 5 (five) minutes as needed. For chest pain    Historical Provider, MD  traMADol (ULTRAM) 50 MG tablet Take 50 mg by mouth 2 (two) times daily as needed. For pain. Maximum dose= 8 tablets per day    Historical Provider, MD    Inpatient medications:    . cefUROXime (ZINACEF)  IV  1.5 g Intravenous On Call to OR  . darbepoetin (ARANESP) injection - NON-DIALYSIS  100 mcg Subcutaneous Once  . sodium chloride  3 mL Intravenous Q12H    Labs: Basic Metabolic Panel:  Lab 07/07/12 6962 07/07/12 1159  NA 136 136  K 6.0* 5.6*  CL 91* 89*  CO2 23 24  GLUCOSE 101* 137*  BUN 134* 132*  CREATININE 14.65* 14.24*  ALB -- --  CALCIUM 9.6 10.2  PHOS -- --   Liver Function Tests: No results found for this basename: AST:3,ALT:3,ALKPHOS:3,BILITOT:3,PROT:3,ALBUMIN:3 in the last 168 hours No results found for this basename: LIPASE:3,AMYLASE:3 in the last 168 hours No results found for this basename: AMMONIA:3 in the last 168 hours CBC:  Lab  07/07/12 1159  WBC 8.8  NEUTROABS --  HGB 8.6*  HCT 24.4*  MCV 83.6  PLT 162   PT/INR: @labrcntip (inr:5) Cardiac Enzymes: No results found for this basename: CKTOTAL:5,CKMB:5,CKMBINDEX:5,TROPONINI:5 in the last 168 hours CBG: No results found for this basename: GLUCAP:5 in the last 168 hours  Iron Studies: No results found for this basename: IRON:30,TIBC:30,TRANSFERRIN:30,FERRITIN:30 in the last 168 hours  Xrays/Other Studies: Dg Chest 2 View  07/07/2012  *RADIOLOGY  REPORT*  Clinical Data: Cough and short of breath  CHEST - 2 VIEW  Comparison: Chest CT and chest radiograph, 01/20/2012  Findings: Mass-like opacity extends posterior and lateral to the left hilum.  There is blunting of the left hemidiaphragm consistent with a small left pleural effusion.  These findings are stable from the prior exam.  The right lung is mildly hyperexpanded, but clear. The cardiac silhouette is mildly enlarged.  No mediastinal or hilar masses or adenopathy.  IMPRESSION: No acute findings.  Stable left perihilar opacity that projects in the left posterior lower lobe.  This has a mass-like appearance, but is stable.  No acute cardiopulmonary disease.   Original Report Authenticated By: Amie Portland, M.D.    Dg Elbow 2 Views Left  07/07/2012  *RADIOLOGY REPORT*  Clinical Data: Abnormal needle count.  LEFT ELBOW - 2 VIEW  Comparison: None.  Findings: Soft tissue swelling and mild subcutaneous emphysema are seen.  To surgical clips are seen in the soft tissues at the level of the distal humerus.  No surgical needle or other radiopaque foreign body identified.  No acute bone abnormality identified.  IMPRESSION: Two surgical clips visualized.  No evidence of surgical needle or other radiopaque foreign body.   Original Report Authenticated By: Myles Rosenthal, M.D.    Dg Chest Port 1 View  07/07/2012  *RADIOLOGY REPORT*  Clinical Data: Post central line placement.  PORTABLE CHEST - 1 VIEW  Comparison: 07/07/2012   Findings: Right dialysis catheter has been placed with the tip in the upper right atrium.  No pneumothorax.  Mild cardiomegaly.  Area of mass/consolidation in the left mid and lower lobe with small left pleural effusion, stable.  Right lung is clear.  IMPRESSION: Right dialysis catheter placement with tip in the upper right atrium.  No pneumothorax.  Continued mass-like consolidation in the left lower lobe with small left effusion, stable.   Original Report Authenticated By: Charlett Nose, M.D.     Physical Exam:  Blood pressure 175/92, pulse 65, temperature 97.8 F (36.6 C), temperature source Oral, resp. rate 17, height 6\' 2"  (1.88 m), weight 84.4 kg (186 lb 1.1 oz), SpO2 100.00%.  Gen: pleasant alert elderly AAM, no distress HEENT:  EOMI, sclera anicteric, throat clear Neck: no JVD, no LAN Chest: clear bilat, no rales CV: regular, no rub or gallop, no murmur, pedal pulses intact Abdomen: soft, nontender, no masses or HSM Ext: no LE edema , L arm wrapped w ACE- not removed, no joint effusion or deformity, no gangrene or ulceration Neuro: alert, Ox3, no focal deficit Access: R IJ HD cath   Outpatient HD: MWF schedule   Impression/Plan 1. Hyperkalemia- HD tonight via HD cath 2. Failed AV fistula, LUA, ligated 3. Pseudoanyeursm L AVF with hematoma formation, s/p evacuation 4. ESRD, hd mwf at [facility].  Will not need HD tomorrow as outpt 5. HTN- hold BP meds tonight 6. Hx mult MI, CAD 7. Hx COPD 8. Hx gout and DJD 9. Dispo- OK for d/c after HD tonight or in am    Vinson Moselle  MD [facility] [phone] pgr    [phone] cell 07/07/2012, 7:25 PM

## 2012-07-07 NOTE — Transfer of Care (Signed)
Immediate Anesthesia Transfer of Care Note  Patient: [name]  Procedure(s) Performed: Procedure(s) (LRB) with comments: LIGATION OF ARTERIOVENOUS  FISTULA (Left) INSERTION OF DIALYSIS CATHETER (Left) - Ultrasound guided  Patient Location: PACU  Anesthesia Type:MAC  Level of Consciousness: awake, oriented and patient cooperative  Airway & Oxygen Therapy: Patient Spontanous Breathing  Post-op Assessment: Report given to PACU RN and Post -op Vital signs reviewed and stable  Post vital signs: Reviewed and stable  Complications: No apparent anesthesia complications

## 2012-07-07 NOTE — ED Notes (Signed)
Patient states he had onset of swelling in his left arm/fistula on Friday,  They did not do dialysis due to swelling.  He returned on Saturday and he had only partial dialysis performed.  Patient reports the area has gotten more swollen and painful.  Patient has pressure dressing over the site at this time

## 2012-07-08 MED ORDER — HYDROCODONE-ACETAMINOPHEN 5-325 MG PO TABS
ORAL_TABLET | ORAL | Status: AC
  Administered 2012-07-08: 2 via ORAL
  Filled 2012-07-08: qty 2

## 2012-07-08 NOTE — ED Provider Notes (Signed)
Medical screening examination/treatment/procedure(s) were conducted as a shared visit with non-physician practitioner(s) and myself.  I personally evaluated the patient during the encounter.   78yo M, c/o LUE AV fistula "swelling" since last Wed.  States he went to HD Wed, Fri, Sat and yesterday but he has not received a full treatment due to malfunctioning shunt.  States they "kept poking my fistual a lot" and it has become progressively more edematous.  Does have hx pseudoaneurysm in AV fistula, but "it wasn't this big."  A&O, VSS, coarse lungs, RRR, +LUE with significant edema over biceps area, no outward signs of bleeding, no open wounds, no erythema.  Labs and Vasc US LUE obtained.  Fistula now with multiple pseudoaneurysms; Vasc MD to consult for ligation of fistula and place tunneled HD cath.  Renal MD to consult for HD treatment after cath placed.     Laray Anger, DO 07/08/12 1438

## 2012-07-08 NOTE — Progress Notes (Addendum)
Subjective:   No complaints, comfortable in bed.  Objective: Vital signs in last 24 hours: Temp:  [97.3 F (36.3 C)-98.4 F (36.9 C)] 98.3 F (36.8 C) (12/18 0528) Pulse Rate:  [56-99] 87  (12/18 0528) Resp:  [16-20] 17  (12/18 0528) BP: (154-182)/(73-104) 165/83 mmHg (12/18 0528) SpO2:  [96 %-100 %] 100 % (12/18 0528) Weight:  [84.4 kg (186 lb 1.1 oz)-127.461 kg (281 lb)] 84.9 kg (187 lb 2.7 oz) (12/17 2345) Weight change:   Intake/Output from previous day: 12/17 0701 - 12/18 0700 In: 880 [P.O.:480; I.V.:400] Out: 1981    EXAM: General appearance:  Alert, in no apparent distress Resp:  CTA without rales, rhonchi, or wheezes Cardio:  RRR with Gr II/VI systolic murmur, no rub GI:  + BS, soft and nontender Extremities:  No edema, left upper arm wrapped Access:  Right IJ catheter  Lab Results:  Basename 07/07/12 1159  WBC 8.8  HGB 8.6*  HCT 24.4*  PLT 162   BMET:  Basename 07/07/12 1717 07/07/12 1159  NA 136 136  K 6.0* 5.6*  CL 91* 89*  CO2 23 24  GLUCOSE 101* 137*  BUN 134* 132*  CREATININE 14.65* 14.24*  CALCIUM 9.6 10.2  ALBUMIN -- --   No results found for this basename: PTH:2 in the last 72 hours Iron Studies: No results found for this basename: IRON,TIBC,TRANSFERRIN,FERRITIN in the last 72 hours  Assessment/Plan: 1. Failed AVF - AVF @ LUA with expanding pseudoaneurysm and hematoma formation, ligated yesterday per Dr. Darrick Penna, right IJ catheter placed. 2. Hyperkalemia - K 6 pre-HD with 2K bath last night. 3. ESRD - HD on MWF @ [facility]; s/p HD last night. 4. HTN/Volume - BP 165/83, currently no meds; current wt 84.9 kg s/p net UF 2 L. 5. Anemia - Hgb 8.6, on outpatient Epogen. 6. Hx LLL lung cancer - chest x-ray contiues to show mass-like consolidation in LLL with small effusion; previously refused workup for surgery/radiation.    LOS: 1 day   [name],[name] 07/08/2012,7:47 AM  Vinson Moselle  MD [facility] [phone] pgr    [phone]  cell 07/08/2012, 11:02 AM

## 2012-07-08 NOTE — Progress Notes (Signed)
[facility] [place]  Subjective  - POD #1  Left AV fistula ligation and insertion of diatek cath.  Objective 165/83 87 98.3 F (36.8 C) (Oral) 17 100%  Intake/Output Summary (Last 24 hours) at 07/08/12 0802 Last data filed at 07/08/12 0402  Gross per 24 hour  Intake    880 ml  Output   1981 ml  Net  -1101 ml    Left upper extremity swelling.  Ace wrap in place for compression.  Sensation , palpable radial pulse intact left upper extremity.  Assessment/Planning: POD #1  Ligation AV fistula secondary to pseudoaneurysm. Diatek cath placed for dialysis access. F/U in our office in 2 weeks for right upper extremity vein mapping and plan for new dialysis AV fistula creation. Keep ace wrap on clean and dry until tomorrow.  It may be removed by dialysis unit when he goes for out patient dialysis.  Clinton Gallant [facility] 07/08/2012 8:02 AM --  Laboratory Lab Results:  [facility] 07/07/12 1159  WBC 8.8  HGB 8.6*  HCT 24.4*  PLT 162   BMET  Basename 07/07/12 1717 07/07/12 1159  NA 136 136  K 6.0* 5.6*  CL 91* 89*  CO2 23 24  GLUCOSE 101* 137*  BUN 134* 132*  CREATININE 14.65* 14.24*  CALCIUM 9.6 10.2    COAG Lab Results  Component Value Date   INR 1.26 01/20/2012   INR 1.20 07/11/2011   INR 1.1 02/17/2007   No results found for this basename: PTT    Antibiotics Anti-infectives     Start     Dose/Rate Route Frequency Ordered Stop   07/08/12 0600   cefUROXime (ZINACEF) 1.5 g in dextrose 5 % 50 mL IVPB        1.5 g 100 mL/hr over 30 Minutes Intravenous On call to O.R. 07/07/12 1350 07/09/12 0559

## 2012-07-08 NOTE — Discharge Summary (Signed)
Physician Discharge Summary  Patient ID: [name] MRN: [id number] DOB/AGE: 1925/10/05 78 y.o.  Admit date: 07/07/2012 Discharge date: 07/08/2012  Admission Diagnoses: 1. Hyperkalemia 2. Malfunctioning AV fistula 3. Pseudoanyeursm L AVF with hematoma formation 4. ESRD, hd mwf at [facility] 5. HTN 6. Hx mult MI, CAD 7. Hx COPD 8. Hx gout and DJD  Discharge Diagnoses: 1. Hyperkalemia- resolved 2. Malfunctioning AV fistula- ligated w TDC placement 3. ESRD, s/p inpt HD x 1 4. HTN 5. Hx MI, COPD, gout and DJD  Discharged Condition: good  Hospital Course: HPI: 78 yo AAM w hx of HTN, CAD, gout and ESRD on HD MWF presented to ED with swelling over his AV fistula and a history of poor dialysis recently due to AV fistula malfunction. Was seen in ED by Dr Darrick Penna who diagnosed an expanding mass over left biceps c/w pseudoaneurysm with bleeding/infiltration. Admitted for operative management of dialysis access and for inpatient HD given high K+ and poor recent dialysis due to access failure. Underwent surgery by Dr. Darrick Penna with ligation of pseudoaneurysmal AVfistula and evacuation of hematoma and had placement of tunneled HD catheter. Had HD overnight without incident. Asymptomatic on day of discharge.  Will f/u with Dr Darrick Penna in 4 weeks, resume usual HD on Friday as outpatient. Dr Darrick Penna has asked that the L arm dressing be removed this Friday, 12/20, at dialysis.    Consults: None  Significant Diagnostic Studies:  Treatments: dialysis: Hemodialysis, surgery by Dr. Darrick Penna with ligation of pseudoaneurysmal AVfistula and evacuation of hematoma   Discharge Exam: Blood pressure 165/83, pulse 87, temperature 98.3 F (36.8 C), temperature source Oral, resp. rate 17, height 6\' 2"  (1.88 m), weight 84.9 kg (187 lb 2.7 oz), SpO2 100.00%.  Gen: pleasant alert elderly AAM, no distress  HEENT: EOMI, sclera anicteric, throat clear  Neck: no JVD, no LAN  Chest: clear bilat, no rales  CV: regular,  no rub or gallop, no murmur, pedal pulses intact  Abdomen: soft, nontender, no masses or HSM  Ext: no LE edema , L arm wrapped w ACE- not removed, no joint effusion or deformity, no gangrene or ulceration  Neuro: alert, Ox3, no focal deficit  Access: R IJ HD cath    Disposition: 01-Home or Self Care  Discharge Orders    Future Appointments: Provider: Department: Dept Phone: Center:   08/03/2012 2:30 PM [facility] [phone] [facility]   08/03/2012 3:45 PM Nada Libman, MD [facility] [phone] [facility]     Future Orders Please Complete By Expires   Resume previous diet      Driving Restrictions      Comments:   No driving for 2 weeks   Lifting restrictions      Comments:   No lifting for 6 weeks   Call MD for:  temperature >100.5      Call MD for:  redness, tenderness, or signs of infection (pain, swelling, bleeding, redness, odor or green/yellow discharge around incision site)      Call MD for:  severe or increased pain, loss or decreased feeling  in affected limb(s)      Increase activity slowly      Comments:   Walk with assistance use walker or cane as needed     -- L arm dressing to be removed by dialysis staff at HD center this Friday 07/10/12      Medication List     As of 07/08/2012  7:46  AM    TAKE these medications         albuterol 108 (90 BASE) MCG/ACT inhaler   Commonly known as: PROVENTIL HFA;VENTOLIN HFA   Inhale 1-2 puffs into the lungs every 6 (six) hours as needed. For shortness of breath      aspirin EC 81 MG tablet   Take 81 mg by mouth at bedtime. Does not take on Sunday's.      cloNIDine 0.2 MG tablet   Commonly known as: CATAPRES   Take 0.2 mg by mouth 2 (two) times daily.      clopidogrel 75 MG tablet   Commonly known as: PLAVIX   Take 75 mg by mouth daily.      furosemide 40 MG tablet   Commonly known as: LASIX   Take 40 mg by mouth 2 (two) times daily.       HYDROcodone-acetaminophen 5-325 MG per tablet   Commonly known as: NORCO/VICODIN   Take 1-2 tablets by mouth every 4 (four) hours as needed for pain.      isosorbide mononitrate 60 MG 24 hr tablet   Commonly known as: IMDUR   Take 60 mg by mouth 2 (two) times daily.      lisinopril 20 MG tablet   Commonly known as: PRINIVIL,ZESTRIL   Take 20 mg by mouth every evening.      LORazepam 0.5 MG tablet   Commonly known as: ATIVAN   Take 0.5 mg by mouth daily as needed. For anxiety.      lovastatin 20 MG tablet   Commonly known as: MEVACOR   Take 20 mg by mouth at bedtime.      montelukast 10 MG tablet   Commonly known as: SINGULAIR   Take 10 mg by mouth at bedtime.      NIFEDICAL XL 60 MG 24 hr tablet   Generic drug: NIFEdipine   Take 60 mg by mouth daily.      nitroGLYCERIN 0.4 MG SL tablet   Commonly known as: NITROSTAT   Place 0.4 mg under the tongue every 5 (five) minutes as needed. For chest pain      RENO CAPS PO   Take 1 capsule by mouth every evening.      traMADol 50 MG tablet   Commonly known as: ULTRAM   Take 50 mg by mouth 2 (two) times daily as needed. For pain. Maximum dose= 8 tablets per day           Follow-up Information    Follow up with Sherren Kerns, MD. In 4 weeks. (sent)    Contact information:   [address] [place] [address] [phone]          Signed: Maree Krabbe 07/08/2012, 7:46 AM

## 2012-07-09 ENCOUNTER — Encounter (HOSPITAL_COMMUNITY): Payer: Self-pay | Admitting: Vascular Surgery

## 2012-07-27 ENCOUNTER — Telehealth: Payer: Self-pay

## 2012-07-27 ENCOUNTER — Other Ambulatory Visit: Payer: Self-pay | Admitting: *Deleted

## 2012-07-27 DIAGNOSIS — Z01810 Encounter for preprocedural cardiovascular examination: Secondary | ICD-10-CM

## 2012-07-27 DIAGNOSIS — N186 End stage renal disease: Secondary | ICD-10-CM

## 2012-07-27 NOTE — Telephone Encounter (Signed)
Pt's wife called to report that pt. Has been coughing-up old blood since evening of 07/24/12.  States pt. Has coughed-up approx. dime-size to nickel-size dark reddish-black-colored mucus about every 5-6 hrs., since evening of 1/3.  Denies that pt. Has any SOB or chest pain.  Wife questions if this could be related to his last procedure.  Advised wife to notify pt's PCP today and report.  Pt. Is currently on Plavix.  Wife verb. Understanding of.

## 2012-07-31 ENCOUNTER — Encounter: Payer: Self-pay | Admitting: Surgery

## 2012-08-03 ENCOUNTER — Encounter: Payer: Self-pay | Admitting: Surgery

## 2012-08-03 ENCOUNTER — Ambulatory Visit (INDEPENDENT_AMBULATORY_CARE_PROVIDER_SITE_OTHER): Payer: Medicare Other | Admitting: Surgery

## 2012-08-03 ENCOUNTER — Encounter (INDEPENDENT_AMBULATORY_CARE_PROVIDER_SITE_OTHER): Payer: Medicare Other | Admitting: *Deleted

## 2012-08-03 VITALS — BP 156/85 | HR 68 | Resp 16 | Ht 74.0 in | Wt 179.0 lb

## 2012-08-03 DIAGNOSIS — N186 End stage renal disease: Secondary | ICD-10-CM

## 2012-08-03 DIAGNOSIS — M79602 Pain in left arm: Secondary | ICD-10-CM | POA: Insufficient documentation

## 2012-08-03 DIAGNOSIS — M79609 Pain in unspecified limb: Secondary | ICD-10-CM

## 2012-08-03 DIAGNOSIS — Z01810 Encounter for preprocedural cardiovascular examination: Secondary | ICD-10-CM

## 2012-08-03 NOTE — Progress Notes (Signed)
[facility] [place]  Patient name: [name] MRN: [id number] DOB: 11/18/1925 Sex: male  Chief Complaint   Patient presents with   .  ESRD     S/P Ligation left upper arm AVF diatek placement 2-3 week f/up. C/O Left neck, shoulder, arm painful, duration 2-3 weeks. Pt. coughing up old black blood, duration one week.    HISTORY OF PRESENT ILLNESS:  The patient comes back for further dialysis access planning. He is right-handed. He underwent left upper arm fistula creation. He is undergone several fistulograms with angioplasty of the cephalic vein. He had extravasation from a needle stick at dialysis which caused him to have a very large swollen arm requiring ligation of his left upper arm fistula. He now has a dialysis catheter in place. He comes in today to discuss further options.   Past Medical History   Diagnosis  Date   .  Hypertension    .  CAD (coronary artery disease)      hx MI x 4   .  Hyperlipidemia    .  Gout    .  Arthritis    .  Anxiety    .  Wears hearing aid    .  Neuromuscular disorder      sciatica right leg   .  ESRD on hemodialysis      MWF at [facility], started HD on January 09, 2011. 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 Had bilat ureterolysis with omental wrap surgery 2003 by Dr. Tannenbaum.   .  Cancer  2010     - monitoring- pt did not want tx   .  Blood transfusion    .  Dysrhythmia    .  COPD (chronic obstructive pulmonary disease)    .  Pneumonia  2009   .  Anemia    .  Adrenal mass      resected 2003 at time of urologic surgery for RP fibrosis    Past Surgical History   Procedure  Date   .  Nose surgery      cauterized for numerous bleeds   .  Kidney surgery    .  Insertion of dialysis catheter    .  Us echocardiography    .  Av fistula placement  07/11/2011     Procedure: ARTERIOVENOUS (AV) FISTULA CREATION; Surgeon: V Wells Armina Galloway, MD; Location: [facility] OR; Service: Vascular; Laterality: Left;   .   Coronary angioplasty with stent placement  2010   .  Diagnostic laparoscopy      remove scar tissue   .  Eye surgery      right cataract surgery   .  Cataract extraction w/phaco  08/14/2011     Procedure: CATARACT EXTRACTION PHACO AND INTRAOCULAR LENS PLACEMENT (IOC); Surgeon: Roy Whitaker, MD; Location: [facility] OR; Service: Ophthalmology; Laterality: Left;   .  Ligation of arteriovenous fistula  07/07/2012     Procedure: LIGATION OF ARTERIOVENOUS FISTULA; Surgeon: Charles E Fields, MD; Location: [facility] OR; Service: Vascular; Laterality: Left;   .  Insertion of dialysis catheter  07/07/2012     Procedure: INSERTION OF DIALYSIS CATHETER; Surgeon: Charles E Fields, MD; Location: [facility] OR; Service: Vascular; Laterality: Left; Ultrasound guided    History    Social History   .  Marital Status:  Married     Spouse Name:  N/A     Number of Children:  N/A   .  Years of Education:  N/A      Occupational History   .  Not on file.    Social History Main Topics   .  Smoking status:  Former Smoker -- 0 years     Types:  Cigarettes     Quit date:  05/31/1963   .  Smokeless tobacco:  Former User     Quit date:  04/07/1937   .  Alcohol Use:  No   .  Drug Use:  No   .  Sexually Active:  Not on file    Other Topics  Concern   .  Not on file    Social History Narrative   .  No narrative on file    Family History   Problem  Relation  Age of Onset   .  Heart disease  Mother    .  Hypertension  Mother    .  Heart disease  Father    .  COPD  Father    .  Hypertension  Sister    .  Heart disease  Sister    .  Anesthesia problems  Neg Hx     Allergies as of 08/03/2012 - Review Complete 08/03/2012   Allergen  Reaction  Noted   .  Caffeine   12/30/2011   .  Ibuprofen  Other (See Comments)  08/14/2011   .  Pork-derived products   12/30/2011   .  Shellfish allergy   12/30/2011   .  Tarka (trandolapril-verapamil hcl er)  Cough  05/24/2011    Current Outpatient Prescriptions on File Prior to Visit    Medication  Sig  Dispense  Refill   .  amLODipine-benazepril (LOTREL) 10-20 MG per capsule  Take 1 capsule by mouth daily.     .  aspirin EC 81 MG tablet  Take 81 mg by mouth at bedtime. Does not take on Sunday's.     .  B Complex-C-Folic Acid (RENO CAPS PO)  Take 1 capsule by mouth every evening.     .  cloNIDine (CATAPRES) 0.2 MG tablet  Take 0.2 mg by mouth 2 (two) times daily.     .  clopidogrel (PLAVIX) 75 MG tablet  Take 75 mg by mouth daily.     .  furosemide (LASIX) 40 MG tablet  Take 40 mg by mouth 2 (two) times daily.     .  HYDROcodone-acetaminophen (NORCO/VICODIN) 5-325 MG per tablet  Take 1-2 tablets by mouth every 4 (four) hours as needed for pain.  30 tablet  0   .  isosorbide mononitrate (IMDUR) 60 MG 24 hr tablet  Take 60 mg by mouth 2 (two) times daily.     .  lisinopril (PRINIVIL,ZESTRIL) 20 MG tablet  Take 20 mg by mouth every evening.     .  LORazepam (ATIVAN) 0.5 MG tablet  Take 0.5 mg by mouth daily as needed. For anxiety.     .  lovastatin (MEVACOR) 20 MG tablet  Take 20 mg by mouth at bedtime.     .  montelukast (SINGULAIR) 10 MG tablet  Take 10 mg by mouth at bedtime.     .  NIFEdipine (NIFEDICAL XL) 60 MG 24 hr tablet  Take 60 mg by mouth daily.     .  nitroGLYCERIN (NITROSTAT) 0.4 MG SL tablet  Place 0.4 mg under the tongue every 5 (five) minutes as needed. For chest pain     .  traMADol (ULTRAM) 50 MG tablet  Take 50 mg by mouth 2 (two) times daily   as needed. For pain. Maximum dose= 8 tablets per day     .   albuterol (PROVENTIL HFA;VENTOLIN HFA) 108 (90 BASE) MCG/ACT inhaler  Inhale 1-2 puffs into the lungs every 6 (six) hours as needed. For shortness of breath       REVIEW OF SYSTEMS:  Cardiovascular: No chest pain, chest pressure, palpitations  Pulmonary: No productive cough, asthma or wheezing.  Neurologic: No weakness, paresthesias, aphasia, or amaurosis. No dizziness.  Hematologic: No bleeding problems or clotting disorders.  Musculoskeletal: Left  shoulder pain  Gastrointestinal: Blood in sputum  Genitourinary: No dysuria or hematuria.  Psychiatric:: No history of major depression.  Integumentary: No rashes or ulcers.  Constitutional: No fever or chills.   PHYSICAL EXAMINATION:  Vital signs are BP 156/85  Pulse 68  Resp 16  Ht 6' 2" (1.88 m)  Wt 179 lb (81.194 kg)  BMI 22.98 kg/m2  SpO2 100%  General: The patient appears their stated age.  HEENT: No gross abnormalities  Pulmonary: Non labored breathing  Musculoskeletal: There are no major deformities.  Neurologic: No focal weakness or paresthesias are detected,  Skin: There are no ulcer or rashes noted.  Psychiatric: The patient has normal affect.  Cardiovascular: Occluded left upper arm fistula. There are no open wounds at this time. He has palpable radial pulse on the left.   Diagnostic Studies  Vein mapping was performed today. The patient does not have an adequate cephalic or basilic vein.   Assessment:  End-stage renal disease   Plan:  The patient would like to have the procedure on his left arm since he is right-handed. I feel once the discomfort goes from his left arm that I can proceed with a left forearm AV Gore-Tex graft. The patient is going to be out of town for the next several weeks. I have therefore, scheduled him for a left forearm graft to be performed on March 6. He is on Plavix and therefore this will be discontinued 7 days prior to his operation    V. Wells Kenton Fortin IV, M.D.  [facility] [place]  Office: [phone]  Pager: [phone]  

## 2012-09-09 ENCOUNTER — Encounter: Payer: Self-pay | Admitting: *Deleted

## 2012-09-09 ENCOUNTER — Other Ambulatory Visit: Payer: Self-pay | Admitting: *Deleted

## 2012-09-21 ENCOUNTER — Encounter (HOSPITAL_COMMUNITY): Payer: Self-pay | Admitting: Pharmacy Technician

## 2012-09-30 ENCOUNTER — Encounter (HOSPITAL_COMMUNITY): Payer: Self-pay

## 2012-09-30 MED ORDER — DEXTROSE 5 % IV SOLN
1.5000 g | INTRAVENOUS | Status: AC
  Administered 2012-10-01: 1.5 g via INTRAVENOUS
  Filled 2012-09-30: qty 1.5

## 2012-10-01 ENCOUNTER — Encounter (HOSPITAL_COMMUNITY): Payer: Self-pay | Admitting: Anesthesiology

## 2012-10-01 ENCOUNTER — Encounter (HOSPITAL_COMMUNITY)
Admission: RE | Disposition: A | Discharge status: Home / Self Care | Payer: Self-pay | Source: Ambulatory Visit | Attending: Surgery

## 2012-10-01 ENCOUNTER — Ambulatory Visit (HOSPITAL_COMMUNITY): Payer: Medicare Other | Admitting: Anesthesiology

## 2012-10-01 ENCOUNTER — Ambulatory Visit (HOSPITAL_COMMUNITY)
Admission: RE | Admit: 2012-10-01 | Discharge: 2012-10-01 | Disposition: A | Discharge status: Home / Self Care | Payer: Medicare Other | Source: Ambulatory Visit | Attending: Surgery | Admitting: Surgery

## 2012-10-01 DIAGNOSIS — N186 End stage renal disease: Secondary | ICD-10-CM | POA: Insufficient documentation

## 2012-10-01 DIAGNOSIS — Z9861 Coronary angioplasty status: Secondary | ICD-10-CM | POA: Insufficient documentation

## 2012-10-01 DIAGNOSIS — F411 Generalized anxiety disorder: Secondary | ICD-10-CM | POA: Insufficient documentation

## 2012-10-01 DIAGNOSIS — Z87891 Personal history of nicotine dependence: Secondary | ICD-10-CM | POA: Insufficient documentation

## 2012-10-01 DIAGNOSIS — M129 Arthropathy, unspecified: Secondary | ICD-10-CM | POA: Insufficient documentation

## 2012-10-01 DIAGNOSIS — J4489 Other specified chronic obstructive pulmonary disease: Secondary | ICD-10-CM | POA: Insufficient documentation

## 2012-10-01 DIAGNOSIS — I120 Hypertensive chronic kidney disease with stage 5 chronic kidney disease or end stage renal disease: Secondary | ICD-10-CM | POA: Insufficient documentation

## 2012-10-01 DIAGNOSIS — Z992 Dependence on renal dialysis: Secondary | ICD-10-CM | POA: Insufficient documentation

## 2012-10-01 DIAGNOSIS — M109 Gout, unspecified: Secondary | ICD-10-CM | POA: Insufficient documentation

## 2012-10-01 DIAGNOSIS — I499 Cardiac arrhythmia, unspecified: Secondary | ICD-10-CM | POA: Insufficient documentation

## 2012-10-01 DIAGNOSIS — I2510 Atherosclerotic heart disease of native coronary artery without angina pectoris: Secondary | ICD-10-CM | POA: Insufficient documentation

## 2012-10-01 DIAGNOSIS — I252 Old myocardial infarction: Secondary | ICD-10-CM | POA: Insufficient documentation

## 2012-10-01 DIAGNOSIS — Z7902 Long term (current) use of antithrombotics/antiplatelets: Secondary | ICD-10-CM | POA: Insufficient documentation

## 2012-10-01 HISTORY — PX: AV FISTULA PLACEMENT: SHX1204

## 2012-10-01 LAB — POCT I-STAT 4, (NA,K, GLUC, HGB,HCT): HCT: 30 % — ABNORMAL LOW (ref 39.0–52.0)

## 2012-10-01 LAB — SURGICAL PCR SCREEN: Staphylococcus aureus: NEGATIVE

## 2012-10-01 SURGERY — INSERTION OF ARTERIOVENOUS (AV) GORE-TEX GRAFT ARM
Anesthesia: Monitor Anesthesia Care | Site: Arm Lower | Laterality: Left | Wound class: Clean

## 2012-10-01 MED ORDER — EPHEDRINE SULFATE 50 MG/ML IJ SOLN
INTRAMUSCULAR | Status: DC | PRN
  Administered 2012-10-01 (×2): 5 mg via INTRAVENOUS

## 2012-10-01 MED ORDER — LIDOCAINE-EPINEPHRINE (PF) 1 %-1:200000 IJ SOLN
INTRAMUSCULAR | Status: DC | PRN
  Administered 2012-10-01: 30 mL

## 2012-10-01 MED ORDER — OXYCODONE HCL 5 MG PO TABS
5.0000 mg | ORAL_TABLET | Freq: Once | ORAL | Status: AC | PRN
  Administered 2012-10-01: 5 mg via ORAL

## 2012-10-01 MED ORDER — 0.9 % SODIUM CHLORIDE (POUR BTL) OPTIME
TOPICAL | Status: DC | PRN
  Administered 2012-10-01: 1000 mL

## 2012-10-01 MED ORDER — HYDROMORPHONE HCL PF 1 MG/ML IJ SOLN
INTRAMUSCULAR | Status: AC
  Filled 2012-10-01: qty 1

## 2012-10-01 MED ORDER — OXYCODONE HCL 5 MG/5ML PO SOLN: 5.0000 mg | Freq: Once | ORAL | Status: AC | PRN

## 2012-10-01 MED ORDER — SODIUM CHLORIDE 0.9 % IV SOLN: INTRAVENOUS | Status: DC

## 2012-10-01 MED ORDER — OXYCODONE-ACETAMINOPHEN 5-325 MG PO TABS: 1.0000 | ORAL_TABLET | ORAL | Status: DC | PRN

## 2012-10-01 MED ORDER — FENTANYL CITRATE 0.05 MG/ML IJ SOLN: 25.0000 ug | INTRAMUSCULAR | Status: DC | PRN

## 2012-10-01 MED ORDER — LIDOCAINE-EPINEPHRINE (PF) 1 %-1:200000 IJ SOLN
INTRAMUSCULAR | Status: AC
  Filled 2012-10-01: qty 10

## 2012-10-01 MED ORDER — OXYCODONE HCL 5 MG PO TABS
ORAL_TABLET | ORAL | Status: AC
  Filled 2012-10-01: qty 1

## 2012-10-01 MED ORDER — FENTANYL CITRATE 0.05 MG/ML IJ SOLN
INTRAMUSCULAR | Status: DC | PRN
  Administered 2012-10-01: 50 ug via INTRAVENOUS
  Administered 2012-10-01 (×2): 25 ug via INTRAVENOUS
  Administered 2012-10-01: 50 ug via INTRAVENOUS

## 2012-10-01 MED ORDER — SODIUM CHLORIDE 0.9 % IV SOLN
INTRAVENOUS | Status: DC | PRN
  Administered 2012-10-01: 07:00:00 via INTRAVENOUS

## 2012-10-01 MED ORDER — MIDAZOLAM HCL 5 MG/5ML IJ SOLN
INTRAMUSCULAR | Status: DC | PRN
  Administered 2012-10-01 (×2): 1 mg via INTRAVENOUS

## 2012-10-01 MED ORDER — SODIUM CHLORIDE 0.9 % IR SOLN
Status: DC | PRN
  Administered 2012-10-01: 08:00:00

## 2012-10-01 MED ORDER — PROPOFOL INFUSION 10 MG/ML OPTIME
INTRAVENOUS | Status: DC | PRN
  Administered 2012-10-01: 100 ug/kg/min via INTRAVENOUS

## 2012-10-01 MED ORDER — ONDANSETRON HCL 4 MG/2ML IJ SOLN
INTRAMUSCULAR | Status: DC | PRN
  Administered 2012-10-01: 4 mg via INTRAVENOUS

## 2012-10-01 MED ORDER — MUPIROCIN 2 % EX OINT
TOPICAL_OINTMENT | CUTANEOUS | Status: AC
  Administered 2012-10-01: 1 via NASAL
  Filled 2012-10-01: qty 22

## 2012-10-01 MED ORDER — PROTAMINE SULFATE 10 MG/ML IV SOLN
INTRAVENOUS | Status: DC | PRN
  Administered 2012-10-01: 25 mg via INTRAVENOUS

## 2012-10-01 MED ORDER — ONDANSETRON HCL 4 MG/2ML IJ SOLN: 4.0000 mg | Freq: Four times a day (QID) | INTRAMUSCULAR | Status: DC | PRN

## 2012-10-01 MED ORDER — HEPARIN SODIUM (PORCINE) 1000 UNIT/ML IJ SOLN
INTRAMUSCULAR | Status: DC | PRN
  Administered 2012-10-01: 5000 [IU] via INTRAVENOUS

## 2012-10-01 MED ORDER — HEMOSTATIC AGENTS (NO CHARGE) OPTIME
TOPICAL | Status: DC | PRN
  Administered 2012-10-01: 1 via TOPICAL

## 2012-10-01 SURGICAL SUPPLY — 39 items
ADH SKN CLS APL DERMABOND .7 (GAUZE/BANDAGES/DRESSINGS) ×1
CANISTER SUCTION 2500CC (MISCELLANEOUS) ×2 IMPLANT
CATH EMB 4FR 80CM (CATHETERS) ×1 IMPLANT
CLIP TI MEDIUM 6 (CLIP) ×2 IMPLANT
CLIP TI WIDE RED SMALL 6 (CLIP) ×2 IMPLANT
CLOTH BEACON ORANGE TIMEOUT ST (SAFETY) ×2 IMPLANT
COVER PROBE W GEL 5X96 (DRAPES) ×1 IMPLANT
COVER SURGICAL LIGHT HANDLE (MISCELLANEOUS) ×2 IMPLANT
DERMABOND ADVANCED (GAUZE/BANDAGES/DRESSINGS) ×1
DERMABOND ADVANCED .7 DNX12 (GAUZE/BANDAGES/DRESSINGS) ×1 IMPLANT
ELECT REM PT RETURN 9FT ADLT (ELECTROSURGICAL) ×2
ELECTRODE REM PT RTRN 9FT ADLT (ELECTROSURGICAL) ×1 IMPLANT
GEL ULTRASOUND 20GR AQUASONIC (MISCELLANEOUS) IMPLANT
GLOVE BIOGEL PI IND STRL 6.5 (GLOVE) IMPLANT
GLOVE BIOGEL PI IND STRL 7.5 (GLOVE) ×1 IMPLANT
GLOVE BIOGEL PI INDICATOR 6.5 (GLOVE) ×3
GLOVE BIOGEL PI INDICATOR 7.5 (GLOVE) ×1
GLOVE ECLIPSE 6.5 STRL STRAW (GLOVE) ×1 IMPLANT
GLOVE SURG SS PI 7.5 STRL IVOR (GLOVE) ×2 IMPLANT
GOWN PREVENTION PLUS XXLARGE (GOWN DISPOSABLE) ×2 IMPLANT
GOWN STRL NON-REIN LRG LVL3 (GOWN DISPOSABLE) ×3 IMPLANT
GRAFT GORETEX STRT 6X50 (Vascular Products) ×1 IMPLANT
HEMOSTAT SNOW SURGICEL 2X4 (HEMOSTASIS) IMPLANT
HEMOSTAT SURGICEL 2X14 (HEMOSTASIS) IMPLANT
KIT BASIN OR (CUSTOM PROCEDURE TRAY) ×2 IMPLANT
KIT ROOM TURNOVER OR (KITS) ×2 IMPLANT
LOOP VESSEL MINI RED (MISCELLANEOUS) ×1 IMPLANT
NS IRRIG 1000ML POUR BTL (IV SOLUTION) ×2 IMPLANT
PACK CV ACCESS (CUSTOM PROCEDURE TRAY) ×2 IMPLANT
PAD ARMBOARD 7.5X6 YLW CONV (MISCELLANEOUS) ×4 IMPLANT
SUT GORETEX 6.0 TT9 (SUTURE) ×2 IMPLANT
SUT PROLENE 6 0 BV (SUTURE) ×3 IMPLANT
SUT VIC AB 3-0 SH 27 (SUTURE) ×4
SUT VIC AB 3-0 SH 27X BRD (SUTURE) ×2 IMPLANT
SUT VICRYL 4-0 PS2 18IN ABS (SUTURE) ×1 IMPLANT
TOWEL OR 17X24 6PK STRL BLUE (TOWEL DISPOSABLE) ×2 IMPLANT
TOWEL OR 17X26 10 PK STRL BLUE (TOWEL DISPOSABLE) ×2 IMPLANT
UNDERPAD 30X30 INCONTINENT (UNDERPADS AND DIAPERS) ×2 IMPLANT
WATER STERILE IRR 1000ML POUR (IV SOLUTION) ×2 IMPLANT

## 2012-10-01 NOTE — Anesthesia Postprocedure Evaluation (Signed)
Anesthesia Post Note  Patient: [name]  Procedure(s) Performed: Procedure(s) (LRB): INSERTION OF ARTERIOVENOUS (AV) GORE-TEX GRAFT ARM (Left)  Anesthesia type: MAC  Patient location: PACU  Post pain: Pain level controlled and Adequate analgesia  Post assessment: Post-op Vital signs reviewed, Patient's Cardiovascular Status Stable and Respiratory Function Stable  Last Vitals:  Filed Vitals:   10/01/12 1015  BP: 126/70  Pulse: 61  Temp:   Resp: 16    Post vital signs: Reviewed and stable  Level of consciousness: awake, alert  and oriented  Complications: No apparent anesthesia complications

## 2012-10-01 NOTE — Transfer of Care (Signed)
Immediate Anesthesia Transfer of Care Note  Patient: [name]  Procedure(s) Performed: Procedure(s) with comments: INSERTION OF ARTERIOVENOUS (AV) GORE-TEX GRAFT ARM (Left) - Ultrasound guided  Patient Location: PACU  Anesthesia Type:MAC  Level of Consciousness: sedated  Airway & Oxygen Therapy: Patient Spontanous Breathing and Patient connected to face mask oxygen  Post-op Assessment: Report given to PACU RN, Post -op Vital signs reviewed and stable and Patient moving all extremities  Post vital signs: Reviewed and stable  Complications: No apparent anesthesia complications

## 2012-10-01 NOTE — H&P (Signed)
[facility] [place]  Patient name: [name] MRN: [id number] DOB: 02-25-1926 Sex: male  Chief Complaint   Patient presents with   .  ESRD     S/P Ligation left upper arm AVF diatek placement 2-3 week f/up. C/O Left neck, shoulder, arm painful, duration 2-3 weeks. Pt. coughing up old black blood, duration one week.    HISTORY OF PRESENT ILLNESS:  The patient comes back for further dialysis access planning. He is right-handed. He underwent left upper arm fistula creation. He is undergone several fistulograms with angioplasty of the cephalic vein. He had extravasation from a needle stick at dialysis which caused him to have a very large swollen arm requiring ligation of his left upper arm fistula. He now has a dialysis catheter in place. He comes in today to discuss further options.   Past Medical History   Diagnosis  Date   .  Hypertension    .  CAD (coronary artery disease)      hx MI x 4   .  Hyperlipidemia    .  Gout    .  Arthritis    .  Anxiety    .  Wears hearing aid    .  Neuromuscular disorder      sciatica right leg   .  ESRD on hemodialysis      MWF at [facility], started HD on January 09, 2011. 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 Had bilat ureterolysis with omental wrap surgery 2003 by Dr. Patsi Sears.   .  Cancer  2010     - monitoring- pt did not want tx   .  Blood transfusion    .  Dysrhythmia    .  COPD (chronic obstructive pulmonary disease)    .  Pneumonia  2009   .  Anemia    .  Adrenal mass      resected 2003 at time of urologic surgery for RP fibrosis    Past Surgical History   Procedure  Date   .  Nose surgery      cauterized for numerous bleeds   .  Kidney surgery    .  Insertion of dialysis catheter    .  US echocardiography    .  Av fistula placement  07/11/2011     Procedure: ARTERIOVENOUS (AV) FISTULA CREATION; Surgeon: Juleen China, MD; Location: [facility] OR; Service: Vascular; Laterality: Left;   .   Coronary angioplasty with stent placement  2010   .  Diagnostic laparoscopy      remove scar tissue   .  Eye surgery      right cataract surgery   .  Cataract extraction w/phaco  08/14/2011     Procedure: CATARACT EXTRACTION PHACO AND INTRAOCULAR LENS PLACEMENT (IOC); Surgeon: Chalmers Guest, MD; Location: [facility] OR; Service: Ophthalmology; Laterality: Left;   .  Ligation of arteriovenous fistula  07/07/2012     Procedure: LIGATION OF ARTERIOVENOUS FISTULA; Surgeon: Sherren Kerns, MD; Location: [facility] OR; Service: Vascular; Laterality: Left;   .  Insertion of dialysis catheter  07/07/2012     Procedure: INSERTION OF DIALYSIS CATHETER; Surgeon: Sherren Kerns, MD; Location: [facility] OR; Service: Vascular; Laterality: Left; Ultrasound guided    History    Social History   .  Marital Status:  Married     Spouse Name:  N/A     Number of Children:  N/A   .  Years of Education:  N/A  Occupational History   .  Not on file.    Social History Main Topics   .  Smoking status:  Former Smoker -- 0 years     Types:  Cigarettes     Quit date:  05/31/1963   .  Smokeless tobacco:  Former Neurosurgeon     Quit date:  04/07/1937   .  Alcohol Use:  No   .  Drug Use:  No   .  Sexually Active:  Not on file    Other Topics  Concern   .  Not on file    Social History Narrative   .  No narrative on file    Family History   Problem  Relation  Age of Onset   .  Heart disease  Mother    .  Hypertension  Mother    .  Heart disease  Father    .  COPD  Father    .  Hypertension  Sister    .  Heart disease  Sister    .  Anesthesia problems  Neg Hx     Allergies as of 08/03/2012 - Review Complete 08/03/2012   Allergen  Reaction  Noted   .  Caffeine   12/30/2011   .  Ibuprofen  Other (See Comments)  08/14/2011   .  Pork-derived products   12/30/2011   .  Shellfish allergy   12/30/2011   .  Tarka (trandolapril-verapamil hcl er)  Cough  05/24/2011    Current Outpatient Prescriptions on File Prior to Visit    Medication  Sig  Dispense  Refill   .  amLODipine-benazepril (LOTREL) 10-20 MG per capsule  Take 1 capsule by mouth daily.     Marland Kitchen  aspirin EC 81 MG tablet  Take 81 mg by mouth at bedtime. Does not take on Sunday's.     .  B Complex-C-Folic Acid (RENO CAPS PO)  Take 1 capsule by mouth every evening.     .  cloNIDine (CATAPRES) 0.2 MG tablet  Take 0.2 mg by mouth 2 (two) times daily.     .  clopidogrel (PLAVIX) 75 MG tablet  Take 75 mg by mouth daily.     .  furosemide (LASIX) 40 MG tablet  Take 40 mg by mouth 2 (two) times daily.     Marland Kitchen  HYDROcodone-acetaminophen (NORCO/VICODIN) 5-325 MG per tablet  Take 1-2 tablets by mouth every 4 (four) hours as needed for pain.  30 tablet  0   .  isosorbide mononitrate (IMDUR) 60 MG 24 hr tablet  Take 60 mg by mouth 2 (two) times daily.     Marland Kitchen  lisinopril (PRINIVIL,ZESTRIL) 20 MG tablet  Take 20 mg by mouth every evening.     Marland Kitchen  LORazepam (ATIVAN) 0.5 MG tablet  Take 0.5 mg by mouth daily as needed. For anxiety.     .  lovastatin (MEVACOR) 20 MG tablet  Take 20 mg by mouth at bedtime.     .  montelukast (SINGULAIR) 10 MG tablet  Take 10 mg by mouth at bedtime.     Marland Kitchen  NIFEdipine (NIFEDICAL XL) 60 MG 24 hr tablet  Take 60 mg by mouth daily.     .  nitroGLYCERIN (NITROSTAT) 0.4 MG SL tablet  Place 0.4 mg under the tongue every 5 (five) minutes as needed. For chest pain     .  traMADol (ULTRAM) 50 MG tablet  Take 50 mg by mouth 2 (two) times daily  as needed. For pain. Maximum dose= 8 tablets per day     .   albuterol (PROVENTIL HFA;VENTOLIN HFA) 108 (90 BASE) MCG/ACT inhaler  Inhale 1-2 puffs into the lungs every 6 (six) hours as needed. For shortness of breath       REVIEW OF SYSTEMS:  Cardiovascular: No chest pain, chest pressure, palpitations  Pulmonary: No productive cough, asthma or wheezing.  Neurologic: No weakness, paresthesias, aphasia, or amaurosis. No dizziness.  Hematologic: No bleeding problems or clotting disorders.  Musculoskeletal: Left  shoulder pain  Gastrointestinal: Blood in sputum  Genitourinary: No dysuria or hematuria.  Psychiatric:: No history of major depression.  Integumentary: No rashes or ulcers.  Constitutional: No fever or chills.   PHYSICAL EXAMINATION:  Vital signs are BP 156/85  Pulse 68  Resp 16  Ht 6\' 2"  (1.88 m)  Wt 179 lb (81.194 kg)  BMI 22.98 kg/m2  SpO2 100%  General: The patient appears their stated age.  HEENT: No gross abnormalities  Pulmonary: Non labored breathing  Musculoskeletal: There are no major deformities.  Neurologic: No focal weakness or paresthesias are detected,  Skin: There are no ulcer or rashes noted.  Psychiatric: The patient has normal affect.  Cardiovascular: Occluded left upper arm fistula. There are no open wounds at this time. He has palpable radial pulse on the left.   Diagnostic Studies  Vein mapping was performed today. The patient does not have an adequate cephalic or basilic vein.   Assessment:  End-stage renal disease   Plan:  The patient would like to have the procedure on his left arm since he is right-handed. I feel once the discomfort goes from his left arm that I can proceed with a left forearm AV Gore-Tex graft. The patient is going to be out of town for the next several weeks. I have therefore, scheduled him for a left forearm graft to be performed on March 6. He is on Plavix and therefore this will be discontinued 7 days prior to his operation    V. Charlena Cross, M.D.  [facility] [place]  Office: [phone]  Pager: [phone]

## 2012-10-01 NOTE — Op Note (Signed)
[facility]   Patient name: [name] MRN: [id number] DOB: [birth year] Sex: male  10/01/2012 Pre-operative Diagnosis: End-stage renal disease Post-operative diagnosis:  Same Surgeon:  Jorge Ny Assistants:  None Procedure:   Left forearm Gore-Tex graft Anesthesia:  MAC Blood Loss:  See anesthesia record Specimens:  None  Findings:  Arterial anastomosis was created just proximal to the bifurcation. I initially was going to use a brachial vein within the transverse incision however I could not pass a Fogarty catheter central through this vein. I therefore elected to use the basilic vein which appeared to be adequate by ultrasound.  Indications:  The patient recently had a left upper arm fistula become unusable. After evaluation of his left arm, I felt he is a better candidate for a forearm graft. He did not wish to have a access site on the right arm at this time as that is his dominant hand.  Procedure:  The patient was identified in the holding area and taken to [facility] OR ROOM 16  The patient was then placed supine on the table. MAC anesthesia was administered.  The patient was prepped and draped in the usual sterile fashion.  A time out was called and antibiotics were administered.  I evaluated the basilic vein with ultrasound and marked it with an ink pen. It appeared to have a early insertion site into the brachial vein, within the mid upper arm. A transverse incision was made below the antecubital crease so as to avoid the patient's previous operative site. I identified the artery within this incision. This was approximately 2-1/2 mm artery. I dissected this out proximally and distally. Distally there was a large branch. Both branches were circumferentially exposed. It was a paired vein which appeared to be adequate to use for the venous outflow tract. I then created a subcutaneous tunnel via a counterincision in the distal forearm. A 6 mm Gore-Tex graft  was brought through the tunnel. The patient was fully heparinized. I then occluded the artery with vascular clamps. A #11 blade was used to make an arteriotomy which was extended with Potts scissors. The graft was beveled to fit the size of the arteriotomy and a running anastomosis was created with CV 6 Gore-Tex suture. Prior to completion, the profundus maneuvers were performed. The clamps were released. There was excellent pulsatile flow to the graft. The graft was then flushed with heparin saline and reoccluded. I then transected the vein in anticipation of a end to end anastomosis. I tried to pass a #4 Fogarty catheter centrally however I met a fair amount of resistance, and therefore I was uncomfortable using this vein. The vein was ligated with a 3-0 silk. I then opened the incision on the medial upper arm and exposed the basilic vein. This was somewhat thickened but appeared to be adequate. Once it was fully exposed I passed the Gore-Tex graft between the 2 incisions. The vein was occluded. It was opened with a #11 blade and extended longitudinally with Potts scissors. I was able to easily pass a Fogarty catheter centrally without resistance. The graft was then cut to the appropriate length and bevel to fit the size of the venotomy. A running anastomosis was created in a end to side fashion. Prior to completion, the profundus maneuvers were performed. The anastomosis was completed. All clamps were released. There was excellent thrill within the graft. The patient had a palpable left radial pulse. 25 mg of protamine was administered. Once hemostasis was  adequate all 3 incisions were closed with an 2 layers of 3-0 Vicryl. Dermabond was applied. There were no complications.   Disposition:  To PACU in stable condition.   Juleen China, M.D. [facility] [place] Office: [phone] Pager:  [phone]

## 2012-10-01 NOTE — Anesthesia Preprocedure Evaluation (Addendum)
Anesthesia Evaluation  Patient identified by MRN, date of birth, ID band Patient awake    Reviewed: Allergy & Precautions, H&P , NPO status , Patient's Chart, lab work & pertinent test results  Airway Mallampati: II  Neck ROM: full    Dental  (+) Edentulous Upper, Poor Dentition and Dental Advisory Given   Pulmonary shortness of breath, COPDformer smoker,          Cardiovascular hypertension, + CAD, + Past MI and + Cardiac Stents     Neuro/Psych Anxiety  Neuromuscular disease    GI/Hepatic   Endo/Other    Renal/GU ESRF and DialysisRenal disease     Musculoskeletal  (+) Arthritis -,   Abdominal   Peds  Hematology   Anesthesia Other Findings   Reproductive/Obstetrics                          Anesthesia Physical Anesthesia Plan  ASA: III  Anesthesia Plan: MAC   Post-op Pain Management:    Induction: Intravenous  Airway Management Planned: Simple Face Mask  Additional Equipment:   Intra-op Plan:   Post-operative Plan:   Informed Consent: I have reviewed the patients History and Physical, chart, labs and discussed the procedure including the risks, benefits and alternatives for the proposed anesthesia with the patient or authorized representative who has indicated his/her understanding and acceptance.     Plan Discussed with: CRNA and Surgeon  Anesthesia Plan Comments:         Anesthesia Quick Evaluation

## 2012-10-03 ENCOUNTER — Encounter (HOSPITAL_COMMUNITY): Payer: Self-pay | Admitting: Surgery

## 2012-10-06 ENCOUNTER — Telehealth: Payer: Self-pay

## 2012-10-06 NOTE — Telephone Encounter (Signed)
Wife called to report "left arm swelling in forearm to just above elbow".  Stated "area was very tight, and today has softened a little bit".  Denies fever or chills.  Denies redness/ warmth/ drainage/ or separation of incision.  Encouraged elevation of left arm above level of heart, and gentle range of motion of fingers.  Encouraged to call office if symptoms worsen.  Verb. Understanding.

## 2012-11-09 ENCOUNTER — Ambulatory Visit
Admission: RE | Admit: 2012-11-09 | Discharge: 2012-11-09 | Disposition: A | Discharge status: Home / Self Care | Payer: Medicare Other | Source: Ambulatory Visit | Attending: Nephrology | Admitting: Nephrology

## 2012-11-09 ENCOUNTER — Other Ambulatory Visit: Payer: Self-pay | Admitting: Nephrology

## 2012-11-09 DIAGNOSIS — Z992 Dependence on renal dialysis: Secondary | ICD-10-CM

## 2012-11-09 DIAGNOSIS — R05 Cough: Secondary | ICD-10-CM

## 2012-11-17 ENCOUNTER — Encounter: Payer: Self-pay | Admitting: Internal Medicine

## 2012-11-17 ENCOUNTER — Ambulatory Visit (INDEPENDENT_AMBULATORY_CARE_PROVIDER_SITE_OTHER): Payer: Medicare Other | Admitting: Internal Medicine

## 2012-11-17 VITALS — BP 120/72 | HR 66 | Temp 98.0°F | Ht 74.0 in | Wt 180.2 lb

## 2012-11-17 DIAGNOSIS — R918 Other nonspecific abnormal finding of lung field: Secondary | ICD-10-CM

## 2012-11-17 DIAGNOSIS — R0989 Other specified symptoms and signs involving the circulatory and respiratory systems: Secondary | ICD-10-CM

## 2012-11-17 DIAGNOSIS — R0600 Dyspnea, unspecified: Secondary | ICD-10-CM

## 2012-11-17 DIAGNOSIS — R222 Localized swelling, mass and lump, trunk: Secondary | ICD-10-CM

## 2012-11-17 DIAGNOSIS — R0602 Shortness of breath: Secondary | ICD-10-CM

## 2012-11-17 NOTE — Progress Notes (Signed)
Subjective:    Patient ID: Derrick Kail., male    DOB: Sep 24, 1925, 79 y.o.   MRN: [id number] PCP Ricki Rodriguez, MD  HPI  IOV 11/17/2012 79 year old male accompanied by his wife and son. His multiple medical problems including end-stage renal disease on Monday Wednesday Friday dialysis. Prior history of smoking. Currently referred by primary care physician and primary cardiologist Dr Algie Coffer for respiratory symptoms along with some hemoptysis.  - He and family are extremely poor historians. The story is complicated. 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 A few months later the Pleurx catheter was removed because of resolution of pleural effusion. The family also states that there is some kind of a "tumor" that Dr. Algie Coffer follows with serial imaging. At baseline he uses a cane, but was Monday Wednesday Friday dialysis, is cognitively intact but is low and is able to do activities of daily living.  -Currently it appears that he has insidious onset of shortness of breath, cough, wheeze for the last few to several months and maybe possibly a year. This progressively worse. Symptoms are rated as moderate. Initially at the onset of black sputum. There is no associated chest pain or poor appetite or weight loss or reduction in functional capacity. It appears in the last few months he's had some minor hemoptysis and the black sputum is resolved. He had a chest x-ray 11/09/2012 that is abnormal and presumably this triggered the referral   - Of note, patient his son and wife are extremely keen that only a qualitative approach to care be adopted. They're against all invasive testing and only one focus of discomfort. They're okay with limited testing that'll help with quality of care and symptom control. They  described the patient is a someone who constantly worries  LOGY REPORT*  Clinical Data: Cough and coarse rhonchi.  CHEST - 2 VIEW  11/09/2012 Comparison: 07/07/2012 and CT chest 01/20/2012.  Findings: Trachea is midline. Right IJ dialysis catheter tips  project over the SVC and SVC/RA junction. Heart is enlarged,  stable. Mass-like area of consolidation in the left perihilar  region with pleural thickening at the base of the left hemithorax,  likely unchanged from prior examination. Right lung is clear.  IMPRESSION:  Mass-like area of consolidation in the left perihilar region with  left basilar pleural thickening, grossly unchanged.  Original Report Authenticated By: Leanna Battles, M.D.   Pathology November 2003 Adrenal gland excision on the left side shows it benign. 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 Hypertension   . CAD (coronary artery disease)     hx MI x 4  . Hyperlipidemia   . Gout   . Arthritis   . Anxiety   . Wears hearing aid   . Neuromuscular disorder     sciatica right leg  . ESRD on hemodialysis     MWF at [facility], started HD on January 09, 2011.  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 Had bilat ureterolysis with omental wrap surgery 2003 by Dr. Patsi Sears.  . Cancer 2010    - monitoring- pt did not want tx  . Blood transfusion   . Dysrhythmia   . COPD (chronic obstructive pulmonary disease)   . Pneumonia 2009  .  Anemia   . Adrenal mass     resected 2003 at time of urologic surgery for RP  fibrosis     Family History  Problem Relation Age of Onset  . Heart disease Mother   . Hypertension Mother   . Heart disease Father   . COPD Father   . Hypertension Sister   . Heart disease Sister   . Anesthesia problems Neg Hx      History   Social History  . Marital Status: Married    Spouse Name: N/A    Number of Children: N/A  . Years of Education: N/A    Occupational History  . Not on file.   Social History Main Topics  . Smoking status: Former Smoker -- 1.00 packs/day for 30 years    Types: Cigarettes    Quit date: 05/31/1963  . Smokeless tobacco: Former Neurosurgeon    Quit date: 04/07/1937  . Alcohol Use: No  . Drug Use: No  . Sexually Active: Not on file   Other Topics Concern  . Not on file   Social History Narrative  . No narrative on file     Allergies  Allergen Reactions  . Caffeine     Seventh day advantist  . Ibuprofen Other (See Comments)    Messed up kidneys  . Pork-Derived Products     Seventh day advantist  . Shellfish Allergy     Seventh day advantist  . Tarka (Trandolapril-Verapamil Hcl Er) Cough     Outpatient Prescriptions Prior to Visit  Medication Sig Dispense Refill  . albuterol (PROVENTIL HFA;VENTOLIN HFA) 108 (90 BASE) MCG/ACT inhaler Inhale 1-2 puffs into the lungs every 6 (six) hours as needed. For shortness of breath      . amLODipine (NORVASC) 10 MG tablet Take 10 mg by mouth every morning.      Marland Kitchen aspirin EC 81 MG tablet Take 81 mg by mouth at bedtime. Does not take on Sunday's.      . cloNIDine (CATAPRES) 0.2 MG tablet Take 0.2 mg by mouth 2 (two) times daily.      . clopidogrel (PLAVIX) 75 MG tablet Take 75 mg by mouth every morning.       . furosemide (LASIX) 40 MG tablet Take 40 mg by mouth 2 (two) times daily.       . isosorbide mononitrate (IMDUR) 60 MG 24 hr tablet Take 60 mg by mouth 2 (two) times daily.       Marland Kitchen lisinopril (PRINIVIL,ZESTRIL) 20 MG tablet Take 20 mg by mouth at bedtime.       Marland Kitchen LORazepam (ATIVAN) 0.5 MG tablet Take 0.5 mg by mouth at bedtime. For anxiety.      . lovastatin (MEVACOR) 20 MG tablet Take 20 mg by mouth at bedtime.       . montelukast (SINGULAIR) 10 MG tablet Take 10 mg by mouth at bedtime.       . multivitamin (RENA-VIT) TABS tablet Take 1 tablet by mouth at bedtime.      Marland Kitchen NIFEdipine (NIFEDICAL XL) 60 MG 24 hr tablet Take 60 mg by mouth every morning.       .  nitroGLYCERIN (NITROSTAT) 0.4 MG SL tablet Place 0.4 mg under the tongue every 5 (five) minutes as needed. For chest pain      . Nutritional Supplements (FEEDING SUPPLEMENT, NEPRO CARB STEADY,) LIQD Take 237 mLs by mouth daily.      Marland Kitchen oxyCODONE-acetaminophen (ROXICET) 5-325 MG per tablet Take 1 tablet by  mouth every 4 (four) hours as needed for pain.  30 tablet  0  . traMADol (ULTRAM) 50 MG tablet Take 50 mg by mouth 2 (two) times daily as needed. For pain. Maximum dose= 8 tablets per day       No facility-administered medications prior to visit.       Review of Systems  Constitutional: Negative for fever and unexpected weight change.  HENT: Negative for ear pain, nosebleeds, congestion, sore throat, rhinorrhea, sneezing, trouble swallowing, dental problem, postnasal drip and sinus pressure.   Eyes: Negative for redness and itching.  Respiratory: Positive for cough, shortness of breath and wheezing. Negative for chest tightness.   Cardiovascular: Negative for palpitations and leg swelling.  Gastrointestinal: Negative for nausea and vomiting.  Genitourinary: Negative for dysuria.  Musculoskeletal: Negative for joint swelling.  Skin: Negative for rash.  Neurological: Negative for headaches.  Hematological: Does not bruise/bleed easily.  Psychiatric/Behavioral: Negative for dysphoric mood. The patient is not nervous/anxious.        Objective:   Physical Exam  Nursing note and vitals reviewed. Constitutional: He is oriented to person, place, and time. He appears well-developed and well-nourished. No distress.  Frail elderly male walking with a cane  HENT:  Head: Normocephalic and atraumatic.  Right Ear: External ear normal.  Left Ear: External ear normal.  Mouth/Throat: Oropharynx is clear and moist. No oropharyngeal exudate.  Eyes: Conjunctivae and EOM are normal. Pupils are equal, round, and reactive to light. Right eye exhibits no discharge. Left eye exhibits no discharge. No  scleral icterus.  Neck: Normal range of motion. Neck supple. No JVD present. No tracheal deviation present. No thyromegaly present.  Cardiovascular: Normal rate, regular rhythm and intact distal pulses.  Exam reveals no gallop and no friction rub.   No murmur heard. Pulmonary/Chest: Effort normal and breath sounds normal. No respiratory distress. He has no wheezes. He has no rales. He exhibits no tenderness.  Abdominal: Soft. Bowel sounds are normal. He exhibits no distension and no mass. There is no tenderness. There is no rebound and no guarding.  Musculoskeletal: Normal range of motion. He exhibits no edema and no tenderness.  Lymphadenopathy:    He has no cervical adenopathy.  Neurological: He is alert and oriented to person, place, and time. He has normal reflexes. No cranial nerve deficit. Coordination normal.  Antalgic gait with a cane  Skin: Skin is warm and dry. No rash noted. He is not diaphoretic. No erythema. No pallor.  Psychiatric: He has a normal mood and affect. His behavior is normal. Judgment and thought content normal.  Poor historian          Assessment & Plan:

## 2012-11-17 NOTE — Patient Instructions (Addendum)
Please do the following tests  - Spirometry pulmonary function test, CT scan of the chest and overnight oxygen test Return to see me after the above next 2-3 weeks

## 2012-11-18 ENCOUNTER — Telehealth: Payer: Self-pay | Admitting: Internal Medicine

## 2012-11-18 NOTE — Assessment & Plan Note (Signed)
Lung mass as seen on chest x-ray 11/09/2012. We'll get a CT scan of the chest to get better clarity. We'll regroup after CT chest

## 2012-11-18 NOTE — Assessment & Plan Note (Signed)
Unclear etiology for dyspnea. We'll get pulmonary function tests and overnight oxygen saturation on room air. We'll regroup after these 2 tests

## 2012-11-18 NOTE — Telephone Encounter (Signed)
Called the first number given and LMTCB Called additional number and NA, unable to leave msg

## 2012-11-19 ENCOUNTER — Other Ambulatory Visit: Payer: Medicare Other

## 2012-11-19 ENCOUNTER — Other Ambulatory Visit: Payer: Self-pay | Admitting: *Deleted

## 2012-11-19 NOTE — Telephone Encounter (Signed)
Spoke with pt's wife and they are not wanting to have Chest Ct done due to effect it may have on his kidneys. And she also states that pt's wheezing is getting better.  I explained the importance of having the CT to get a better picture of his lungs.  She wants to check with Dr Marchelle Gearing about her concerns first .  Please advise.

## 2012-11-19 NOTE — Telephone Encounter (Signed)
Let them know CT is to get information so we cna plan to help his quality of life as per their stated goals. IF they still want to hold off, please let them know totally fine and we do respect that and we will go ahead with other infirmation  Dr. Kalman Shan, M.D., [facility].C.P Pulmonary and Critical Care Medicine Staff Physician [facility] [facility] Pager: [phone], If no answer or between  15:00h - 7:00h: call [phone]  11/19/2012 1:39 PM

## 2012-11-19 NOTE — Telephone Encounter (Signed)
Called the pt's spouse She states that we need to talk with the pt's son I have called number listed for him, NA and mailbox was full so unable to leave a msg [facility]

## 2012-11-19 NOTE — Telephone Encounter (Signed)
Spoke with pt's son Dameion Briles [phone]) and verified with him that pt is in end stage renal disease.  Pt has just recently started dialysis.  I explained Dr Jane Canary reasoning behind ct dye.  He verbalized understanding but still wants to hold off on doing chest ct for now even if it's without dye.  They are willing to go ahead with ono and pft.  Please advise how to proceded.

## 2012-11-19 NOTE — Telephone Encounter (Signed)
Please confirm he is end stage renal disease on dialysis ? If so and is permanent dialysis, then giving contrast will not hurt his kidneys anymore because it is already failed and he is on dialysis.   If they are still resistant, just change to ct chest without contrast   Dr. Kalman Shan, M.D., [facility].C.P Pulmonary and Critical Care Medicine Staff Physician [facility] [place] [facility] and Critical Care Pager: [phone], If no answer or between  15:00h - 7:00h: call [phone]  11/19/2012 10:08 AM

## 2012-11-19 NOTE — Telephone Encounter (Signed)
[phone] Taten the son returning a call

## 2012-11-19 NOTE — Telephone Encounter (Signed)
I spoke with the pt wife and she states she would like [place] to speak to her son and she is going to call him and have him contact us. Will await call. Carron Curie, CMA

## 2012-11-20 NOTE — Telephone Encounter (Signed)
[email or web address]  

## 2012-11-23 ENCOUNTER — Inpatient Hospital Stay (HOSPITAL_COMMUNITY)
Admission: EM | Admit: 2012-11-23 | Discharge: 2012-11-28 | DRG: 204 | Disposition: A | Discharge status: Home Health | Payer: Medicare Other | Attending: Cardiovascular Disease | Admitting: Cardiovascular Disease

## 2012-11-23 ENCOUNTER — Telehealth: Payer: Self-pay | Admitting: Critical Care Medicine

## 2012-11-23 ENCOUNTER — Emergency Department (HOSPITAL_COMMUNITY): Payer: Medicare Other

## 2012-11-23 DIAGNOSIS — N186 End stage renal disease: Secondary | ICD-10-CM | POA: Diagnosis present

## 2012-11-23 DIAGNOSIS — E785 Hyperlipidemia, unspecified: Secondary | ICD-10-CM | POA: Diagnosis present

## 2012-11-23 DIAGNOSIS — N135 Crossing vessel and stricture of ureter without hydronephrosis: Secondary | ICD-10-CM | POA: Diagnosis present

## 2012-11-23 DIAGNOSIS — D631 Anemia in chronic kidney disease: Secondary | ICD-10-CM | POA: Diagnosis present

## 2012-11-23 DIAGNOSIS — M129 Arthropathy, unspecified: Secondary | ICD-10-CM | POA: Diagnosis present

## 2012-11-23 DIAGNOSIS — Z992 Dependence on renal dialysis: Secondary | ICD-10-CM

## 2012-11-23 DIAGNOSIS — I2510 Atherosclerotic heart disease of native coronary artery without angina pectoris: Secondary | ICD-10-CM | POA: Diagnosis present

## 2012-11-23 DIAGNOSIS — J90 Pleural effusion, not elsewhere classified: Secondary | ICD-10-CM | POA: Diagnosis present

## 2012-11-23 DIAGNOSIS — J449 Chronic obstructive pulmonary disease, unspecified: Secondary | ICD-10-CM | POA: Diagnosis present

## 2012-11-23 DIAGNOSIS — R0602 Shortness of breath: Secondary | ICD-10-CM

## 2012-11-23 DIAGNOSIS — I252 Old myocardial infarction: Secondary | ICD-10-CM

## 2012-11-23 DIAGNOSIS — Z7982 Long term (current) use of aspirin: Secondary | ICD-10-CM

## 2012-11-23 DIAGNOSIS — I120 Hypertensive chronic kidney disease with stage 5 chronic kidney disease or end stage renal disease: Secondary | ICD-10-CM | POA: Diagnosis present

## 2012-11-23 DIAGNOSIS — N2581 Secondary hyperparathyroidism of renal origin: Secondary | ICD-10-CM | POA: Diagnosis present

## 2012-11-23 DIAGNOSIS — R222 Localized swelling, mass and lump, trunk: Principal | ICD-10-CM | POA: Diagnosis present

## 2012-11-23 DIAGNOSIS — Z9861 Coronary angioplasty status: Secondary | ICD-10-CM

## 2012-11-23 DIAGNOSIS — M109 Gout, unspecified: Secondary | ICD-10-CM | POA: Diagnosis present

## 2012-11-23 DIAGNOSIS — R042 Hemoptysis: Secondary | ICD-10-CM | POA: Diagnosis present

## 2012-11-23 DIAGNOSIS — N039 Chronic nephritic syndrome with unspecified morphologic changes: Secondary | ICD-10-CM | POA: Diagnosis present

## 2012-11-23 DIAGNOSIS — F411 Generalized anxiety disorder: Secondary | ICD-10-CM | POA: Diagnosis present

## 2012-11-23 DIAGNOSIS — M5430 Sciatica, unspecified side: Secondary | ICD-10-CM | POA: Diagnosis present

## 2012-11-23 DIAGNOSIS — J4489 Other specified chronic obstructive pulmonary disease: Secondary | ICD-10-CM | POA: Diagnosis present

## 2012-11-23 DIAGNOSIS — Z79899 Other long term (current) drug therapy: Secondary | ICD-10-CM

## 2012-11-23 DIAGNOSIS — Z66 Do not resuscitate: Secondary | ICD-10-CM | POA: Diagnosis present

## 2012-11-23 LAB — CBC WITH DIFFERENTIAL/PLATELET
Basophils Absolute: 0.1 10*3/uL (ref 0.0–0.1)
Basophils Relative: 1 % (ref 0–1)
Eosinophils Absolute: 0.9 10*3/uL — ABNORMAL HIGH (ref 0.0–0.7)
Eosinophils Relative: 9 % — ABNORMAL HIGH (ref 0–5)
HCT: 21.9 % — ABNORMAL LOW (ref 39.0–52.0)
Hemoglobin: 7.9 g/dL — ABNORMAL LOW (ref 13.0–17.0)
Lymphocytes Relative: 13 % (ref 12–46)
Lymphs Abs: 1.3 10*3/uL (ref 0.7–4.0)
MCH: 28.2 pg (ref 26.0–34.0)
MCHC: 36.1 g/dL — ABNORMAL HIGH (ref 30.0–36.0)
MCV: 78.2 fL (ref 78.0–100.0)
Monocytes Absolute: 0.7 10*3/uL (ref 0.1–1.0)
Monocytes Relative: 7 % (ref 3–12)
Neutro Abs: 7.1 10*3/uL (ref 1.7–7.7)
Neutrophils Relative %: 71 % (ref 43–77)
Platelets: 269 10*3/uL (ref 150–400)
RBC: 2.8 MIL/uL — ABNORMAL LOW (ref 4.22–5.81)
RDW: 18.1 % — ABNORMAL HIGH (ref 11.5–15.5)
WBC: 10 10*3/uL (ref 4.0–10.5)

## 2012-11-23 LAB — POCT I-STAT 3, ART BLOOD GAS (G3+)
Bicarbonate: 24.7 mEq/L — ABNORMAL HIGH (ref 20.0–24.0)
O2 Saturation: 99 %
TCO2: 26 mmol/L (ref 0–100)
pCO2 arterial: 38.2 mmHg (ref 35.0–45.0)

## 2012-11-23 LAB — TROPONIN I: Troponin I: <0.3 ng/mL (ref <0.30)

## 2012-11-23 LAB — COMPREHENSIVE METABOLIC PANEL
ALT: 17 U/L (ref 0–53)
AST: 24 U/L (ref 0–37)
Albumin: 3.3 g/dL — ABNORMAL LOW (ref 3.5–5.2)
Alkaline Phosphatase: 88 U/L (ref 39–117)
BUN: 93 mg/dL — ABNORMAL HIGH (ref 6–23)
CO2: 23 mEq/L (ref 19–32)
Calcium: 10 mg/dL (ref 8.4–10.5)
Chloride: 93 mEq/L — ABNORMAL LOW (ref 96–112)
Creatinine, Ser: 10.68 mg/dL — ABNORMAL HIGH (ref 0.50–1.35)
GFR calc Af Amer: 4 mL/min — ABNORMAL LOW (ref >90)
GFR calc non Af Amer: 4 mL/min — ABNORMAL LOW (ref >90)
Glucose, Bld: 85 mg/dL (ref 70–99)
Potassium: 4.7 mEq/L (ref 3.5–5.1)
Sodium: 135 mEq/L (ref 135–145)
Total Bilirubin: 0.3 mg/dL (ref 0.3–1.2)
Total Protein: 8.9 g/dL — ABNORMAL HIGH (ref 6.0–8.3)

## 2012-11-23 LAB — PREPARE RBC (CROSSMATCH)

## 2012-11-23 LAB — MRSA PCR SCREENING: MRSA by PCR: NEGATIVE

## 2012-11-23 MED ORDER — HEPARIN SODIUM (PORCINE) 1000 UNIT/ML DIALYSIS
1000.0000 [IU] | INTRAMUSCULAR | Status: DC | PRN
  Filled 2012-11-23: qty 1

## 2012-11-23 MED ORDER — SODIUM CHLORIDE 0.9 % IJ SOLN: 3.0000 mL | INTRAMUSCULAR | Status: DC | PRN

## 2012-11-23 MED ORDER — SODIUM CHLORIDE 0.9 % IJ SOLN
3.0000 mL | Freq: Two times a day (BID) | INTRAMUSCULAR | Status: DC
  Administered 2012-11-23 – 2012-11-27 (×7): 3 mL via INTRAVENOUS

## 2012-11-23 MED ORDER — AMLODIPINE BESYLATE 10 MG PO TABS
10.0000 mg | ORAL_TABLET | Freq: Every morning | ORAL | Status: DC
  Administered 2012-11-24 – 2012-11-28 (×5): 10 mg via ORAL
  Filled 2012-11-23 (×5): qty 1

## 2012-11-23 MED ORDER — SODIUM CHLORIDE 0.9 % IV SOLN: 250.0000 mL | INTRAVENOUS | Status: DC | PRN

## 2012-11-23 MED ORDER — SIMVASTATIN 5 MG PO TABS
5.0000 mg | ORAL_TABLET | Freq: Every day | ORAL | Status: DC
  Administered 2012-11-24 – 2012-11-27 (×4): 5 mg via ORAL
  Filled 2012-11-23 (×6): qty 1

## 2012-11-23 MED ORDER — OXYCODONE HCL 5 MG PO TABS: 5.0000 mg | ORAL_TABLET | ORAL | Status: DC | PRN

## 2012-11-23 MED ORDER — LORAZEPAM 0.5 MG PO TABS
0.5000 mg | ORAL_TABLET | Freq: Every day | ORAL | Status: DC
  Administered 2012-11-23 – 2012-11-27 (×5): 0.5 mg via ORAL
  Filled 2012-11-23 (×5): qty 1

## 2012-11-23 MED ORDER — SODIUM CHLORIDE 0.9 % IV SOLN: 100.0000 mL | INTRAVENOUS | Status: DC | PRN

## 2012-11-23 MED ORDER — LIDOCAINE HCL (PF) 1 % IJ SOLN: 5.0000 mL | INTRAMUSCULAR | Status: DC | PRN

## 2012-11-23 MED ORDER — FERROUS SULFATE 325 (65 FE) MG PO TABS
325.0000 mg | ORAL_TABLET | Freq: Every day | ORAL | Status: DC
  Administered 2012-11-24 – 2012-11-28 (×5): 325 mg via ORAL
  Filled 2012-11-23 (×7): qty 1

## 2012-11-23 MED ORDER — MONTELUKAST SODIUM 10 MG PO TABS
10.0000 mg | ORAL_TABLET | Freq: Every day | ORAL | Status: DC
  Administered 2012-11-23 – 2012-11-27 (×5): 10 mg via ORAL
  Filled 2012-11-23 (×6): qty 1

## 2012-11-23 MED ORDER — DARBEPOETIN ALFA-POLYSORBATE 100 MCG/0.5ML IJ SOLN: 100.0000 ug | INTRAMUSCULAR | Status: DC

## 2012-11-23 MED ORDER — NEPRO/CARBSTEADY PO LIQD: 237.0000 mL | ORAL | Status: DC | PRN

## 2012-11-23 MED ORDER — CALCIUM ACETATE (PHOS BINDER) 667 MG/5ML PO SOLN
667.0000 mg | Freq: Three times a day (TID) | ORAL | Status: DC
  Administered 2012-11-24 – 2012-11-28 (×11): 667 mg via ORAL
  Filled 2012-11-23 (×18): qty 5

## 2012-11-23 MED ORDER — ALTEPLASE 2 MG IJ SOLR
2.0000 mg | Freq: Once | INTRAMUSCULAR | Status: AC | PRN
  Filled 2012-11-23: qty 2

## 2012-11-23 MED ORDER — NITROGLYCERIN 0.4 MG SL SUBL: 0.4000 mg | SUBLINGUAL_TABLET | SUBLINGUAL | Status: DC | PRN

## 2012-11-23 MED ORDER — LIDOCAINE-PRILOCAINE 2.5-2.5 % EX CREA: 1.0000 "application " | TOPICAL_CREAM | CUTANEOUS | Status: DC | PRN

## 2012-11-23 MED ORDER — ISOSORBIDE MONONITRATE ER 60 MG PO TB24
60.0000 mg | ORAL_TABLET | Freq: Two times a day (BID) | ORAL | Status: DC
  Administered 2012-11-23 – 2012-11-28 (×10): 60 mg via ORAL
  Filled 2012-11-23 (×11): qty 1

## 2012-11-23 MED ORDER — GUAIFENESIN-DM 100-10 MG/5ML PO SYRP
5.0000 mL | ORAL_SOLUTION | ORAL | Status: DC | PRN
  Filled 2012-11-23: qty 5

## 2012-11-23 MED ORDER — LISINOPRIL 20 MG PO TABS
20.0000 mg | ORAL_TABLET | Freq: Every day | ORAL | Status: DC
  Administered 2012-11-23 – 2012-11-27 (×5): 20 mg via ORAL
  Filled 2012-11-23 (×6): qty 1

## 2012-11-23 MED ORDER — ONDANSETRON HCL 4 MG/2ML IJ SOLN: 4.0000 mg | Freq: Four times a day (QID) | INTRAMUSCULAR | Status: DC | PRN

## 2012-11-23 MED ORDER — ALBUTEROL SULFATE HFA 108 (90 BASE) MCG/ACT IN AERS
2.0000 | INHALATION_SPRAY | Freq: Four times a day (QID) | RESPIRATORY_TRACT | Status: DC
  Filled 2012-11-23: qty 6.7

## 2012-11-23 MED ORDER — PENTAFLUOROPROP-TETRAFLUOROETH EX AERO: 1.0000 "application " | INHALATION_SPRAY | CUTANEOUS | Status: DC | PRN

## 2012-11-23 MED ORDER — ALBUTEROL SULFATE 0.63 MG/3ML IN NEBU
1.0000 | INHALATION_SOLUTION | Freq: Four times a day (QID) | RESPIRATORY_TRACT | Status: DC | PRN
Start: 2012-11-23 — End: 2012-11-23

## 2012-11-23 MED ORDER — ONDANSETRON HCL 4 MG PO TABS: 4.0000 mg | ORAL_TABLET | Freq: Four times a day (QID) | ORAL | Status: DC | PRN

## 2012-11-23 MED ORDER — CLONIDINE HCL 0.2 MG PO TABS
0.2000 mg | ORAL_TABLET | Freq: Two times a day (BID) | ORAL | Status: DC
  Administered 2012-11-23 – 2012-11-28 (×10): 0.2 mg via ORAL
  Filled 2012-11-23 (×12): qty 1

## 2012-11-23 MED ORDER — ALBUTEROL SULFATE (5 MG/ML) 0.5% IN NEBU: 2.5000 mg | INHALATION_SOLUTION | Freq: Four times a day (QID) | RESPIRATORY_TRACT | Status: DC | PRN

## 2012-11-23 NOTE — Consult Note (Signed)
LETCHER Taylor 11/23/2012 [name] Derrick Requesting Physician:  [name]  Reason for Consult:  ESRD pt with hemoptysis and SOB , known lung mass HPI: The patient is a 79 y.o. year-old with hx of HTN, CAD/MI, COPD and ESRD on hemodialysis since June 2012.  He has a history of a known left lung mass presumed to be malignancy.  He and family have declined biopsy/work-up of mass now and in the past,  they say that he "doesn't want to go through all of that". He presented with hemoptysis, cough, SOB and difficulty sleeping to ED.  CXR shows L lung mass, no CHF.  No fevers. Started coughing up larger amts of blood last 24 hrs, has been having milder hemoptysis for a month or two per family.   ROS  no fever  no CP  no n/v/Derrick  no jt pain or swelling  no confusion or HA   Past Medical History:  Past Medical History  Diagnosis Date  . Hypertension   . CAD (coronary artery disease)     hx MI x 4  . Hyperlipidemia   . Gout   . Arthritis   . Anxiety   . Wears hearing aid   . Neuromuscular disorder     sciatica right leg  . ESRD on hemodialysis     MWF at [facility], started HD on January 09, 2011.  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 Had bilat ureterolysis with omental wrap surgery 2003 by Derrick. Patsi Sears.  . Cancer 2010    - monitoring- pt did not want tx  . Blood transfusion   . Dysrhythmia   . COPD (chronic obstructive pulmonary disease)   . Pneumonia 2009  . Anemia   . Adrenal mass     resected 2003 at time of urologic surgery for RP  fibrosis    Past Surgical History:  Past Surgical History  Procedure Laterality Date  . Nose surgery      cauterized for numerous bleeds  . Kidney surgery    . Insertion of dialysis catheter    . US echocardiography    . Av fistula placement  07/11/2011    Procedure: ARTERIOVENOUS (AV) FISTULA CREATION;  Surgeon: Juleen China, MD;  Location: [facility] OR;  Service: Vascular;  Laterality: Left;  . Coronary angioplasty with stent  placement  2010  . Diagnostic laparoscopy      remove scar tissue  . Eye surgery      right cataract surgery  . Cataract extraction w/phaco  08/14/2011    Procedure: CATARACT EXTRACTION PHACO AND INTRAOCULAR LENS PLACEMENT (IOC);  Surgeon: Chalmers Guest, MD;  Location: [facility] OR;  Service: Ophthalmology;  Laterality: Left;  . Ligation of arteriovenous  fistula  07/07/2012    Procedure: LIGATION OF ARTERIOVENOUS  FISTULA;  Surgeon: Sherren Kerns, MD;  Location: [facility] OR;  Service: Vascular;  Laterality: Left;  . Insertion of dialysis catheter  07/07/2012    Procedure: INSERTION OF DIALYSIS CATHETER;  Surgeon: Sherren Kerns, MD;  Location: [facility] OR;  Service: Vascular;  Laterality: Left;  Ultrasound guided  . Av fistula placement Left 10/01/2012    Procedure: INSERTION OF ARTERIOVENOUS (AV) GORE-TEX GRAFT ARM;  Surgeon: Nada Libman, MD;  Location: [facility] OR;  Service: Vascular;  Laterality: Left;  Ultrasound guided  . Chest tube insertion  2011    Family History:  Family History  Problem Relation Age of Onset  . Heart disease Mother   . Hypertension Mother   .  Heart disease Father   . COPD Father   . Hypertension Sister   . Heart disease Sister   . Anesthesia problems Neg Hx    Social History:  reports that he quit smoking about 49 years ago. His smoking use included Cigarettes. He has a 30 pack-year smoking history. He quit smokeless tobacco use about 75 years ago. He reports that he does not drink alcohol or use illicit drugs.  Allergies:  Allergies  Allergen Reactions  . Caffeine     Seventh day advantist  . Ibuprofen Other (See Comments)    Messed up kidneys  . Pork-Derived Products     Seventh day advantist  . Shellfish Allergy     Seventh day advantist  . Tarka (Trandolapril-Verapamil Hcl Er) Cough    Home medications: Prior to Admission medications   Medication Sig Start Date End Date Taking? Authorizing Provider  albuterol (ACCUNEB) 0.63 MG/3ML nebulizer solution Take 1  ampule by nebulization every 6 (six) hours as needed for wheezing.   Yes Historical Provider, MD  albuterol (PROVENTIL HFA;VENTOLIN HFA) 108 (90 BASE) MCG/ACT inhaler Inhale 1-2 puffs into the lungs every 6 (six) hours as needed. For shortness of breath 01/20/12 01/19/13 Yes Glynn Octave, MD  amLODipine (NORVASC) 10 MG tablet Take 10 mg by mouth every morning.   Yes Historical Provider, MD  aspirin EC 81 MG tablet Take 81 mg by mouth at bedtime. Does not take on Sunday's.   Yes Historical Provider, MD  calcium acetate, Phos Binder, (PHOSLYRA) 667 MG/5ML SOLN Take 667 mg by mouth 3 (three) times daily with meals.   Yes Historical Provider, MD  cloNIDine (CATAPRES) 0.2 MG tablet Take 0.2 mg by mouth 2 (two) times daily.   Yes Historical Provider, MD  clopidogrel (PLAVIX) 75 MG tablet Take 75 mg by mouth every morning.    Yes Historical Provider, MD  ferrous sulfate ([employer] IRON) 325 (65 FE) MG tablet Take 325 mg by mouth daily with breakfast.   Yes Historical Provider, MD  furosemide (LASIX) 40 MG tablet Take 40 mg by mouth 2 (two) times daily.    Yes Historical Provider, MD  isosorbide mononitrate (IMDUR) 60 MG 24 hr tablet Take 60 mg by mouth 2 (two) times daily.    Yes Historical Provider, MD  lisinopril (PRINIVIL,ZESTRIL) 20 MG tablet Take 20 mg by mouth at bedtime.    Yes Historical Provider, MD  LORazepam (ATIVAN) 0.5 MG tablet Take 0.5 mg by mouth at bedtime. For anxiety.   Yes Historical Provider, MD  lovastatin (MEVACOR) 20 MG tablet Take 20 mg by mouth at bedtime.    Yes Historical Provider, MD  montelukast (SINGULAIR) 10 MG tablet Take 10 mg by mouth at bedtime.    Yes Historical Provider, MD  multivitamin (RENA-VIT) TABS tablet Take 1 tablet by mouth at bedtime.   Yes Historical Provider, MD  NIFEdipine (NIFEDICAL XL) 60 MG 24 hr tablet Take 60 mg by mouth every morning.    Yes Historical Provider, MD  nitroGLYCERIN (NITROSTAT) 0.4 MG SL tablet Place 0.4 mg under the tongue every 5 (five)  minutes as needed. For chest pain   Yes Historical Provider, MD  Nutritional Supplements (FEEDING SUPPLEMENT, NEPRO CARB STEADY,) LIQD Take 237 mLs by mouth daily.   Yes Historical Provider, MD  traMADol (ULTRAM) 50 MG tablet Take 50 mg by mouth 2 (two) times daily as needed. For pain. Maximum dose= 8 tablets per day   Yes Historical Provider, MD  Whey Protein POWD Take  1 application by mouth daily.   Yes Historical Provider, MD    Labs: Basic Metabolic Panel:  Recent Labs Lab 11/23/12 0851  NA 135  K 4.7  CL 93*  CO2 23  GLUCOSE 85  BUN 93*  CREATININE 10.68*  CALCIUM 10.0   Liver Function Tests:  Recent Labs Lab 11/23/12 0851  AST 24  ALT 17  ALKPHOS 88  BILITOT 0.3  PROT 8.9*  ALBUMIN 3.3*   No results found for this basename: LIPASE, AMYLASE,  in the last 168 hours No results found for this basename: AMMONIA,  in the last 168 hours CBC:  Recent Labs Lab 11/23/12 0851  WBC 10.0  NEUTROABS 7.1  HGB 7.9*  HCT 21.9*  MCV 78.2  PLT 269   PT/INR: @LABRCNTIP (inr:5) Cardiac Enzymes: ) Recent Labs Lab 11/23/12 0851  TROPONINI <0.30   CBG: No results found for this basename: GLUCAP,  in the last 168 hours  Physical Exam:  Blood pressure 134/64, pulse 77, temperature 97.7 F (36.5 C), temperature source Oral, resp. rate 22, height 6\' 2"  (1.88 m), weight 79.379 kg (175 lb), SpO2 100.00%.  Gen: alert elderly male in no distress HEENT:  EOMI, sclera anicteric, throat clear Neck: no JVD, no LAN,  Chest: clear on R, loud rhonchi, and coarse wheezed L base 1/2 up CV: regular, no rub or gallop, pedal pulses intact Abdomen: soft, nondistended, no ascites or HSM Ext: no LE edema, no joint effusion or deformity, no gangrene or ulceration Neuro: alert, Ox3, no focal deficit Access: R IJ HD cath, old accesses in LUA  [facility]  4 hrs  80kg  IJ cath,  Profile 4/VNa 148  2K/2.25Ca  EPO 20K  Hep bolus 8000   Venofer 100 q Rx     Impression/Plan 1. Hemoptysis- pt with known lung mass, presumably cancer. No evidence of vol overload to be causing this 2. ESRD, cont HD MWF. HD today. No heparin with HD 3. HTN/volume- no volume excess on exam or xray. BP up , right at dry weight, has probably lost weight. UF 2-3 kg with HD today 4. Anemia of CKD- Hb low at 7.9, will give one unit prbc with HD today. Will give darbe IV with HD today 5. Secondary HPTH- takes phoslo as binder, not vit Derrick 6. Hx CAD / MI 7. DNR 8. Hx COPD   Vinson Moselle  MD [facility] [phone] pgr    [phone] cell 11/23/2012, 3:47 PM

## 2012-11-23 NOTE — Telephone Encounter (Signed)
Call to home phone number where I talked with wife.  She states that since last pm the patient has been extremely SOB and coughing up blood.  She states that it is "gobs and gobs' of blood and the patient has filled up a trash can with paper towels full of blood.  She states that the stuff coughed up is now pink tinged but the patient cannot breath.  If was very difficult to obtain any more information from the wife.  She reported that the patent received dialysis last Friday and that "is not the problem" that he is SOB and coughing up blood from other problems.  The patent is on ASA and plavix.   Plan: I have instructed that wife to bring the patient to the [facility] emergency room for evaluation

## 2012-11-23 NOTE — ED Notes (Signed)
Ordered diet tray 

## 2012-11-23 NOTE — Progress Notes (Signed)
11/23/2012 patient came from the emergency room to 6700 at 1740. He is alert, oriented and uses a cane to ambulate. He is weak and also patient is coughing up blood. Patient was place on bed alarm, and fall risk band was placed on arm. Per wife patient fell two weeks ago. Patient does not have any wound, but skin is dry. Bilateral lower ankle swelling noted. [facility] RN.

## 2012-11-23 NOTE — H&P (Signed)
[name] is an 79 y.o. male.   Chief Complaint: Cough with hemoptysis HPI: 79 years old male with known lung mass, treated medically only per patient and family request has increasing cough and hemoptysis. He is also on aspirin and plavix. No fever or chest pain.  Past Medical History  Diagnosis Date  . Hypertension   . CAD (coronary artery disease)     hx MI x 4  . Hyperlipidemia   . Gout   . Arthritis   . Anxiety   . Wears hearing aid   . Neuromuscular disorder     sciatica right leg  . ESRD on hemodialysis     MWF at [facility], started HD on January 09, 2011.  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 Had bilat ureterolysis with omental wrap surgery 2003 by Dr. Patsi Sears.  . Cancer 2010    - monitoring- pt did not want tx  . Blood transfusion   . Dysrhythmia   . COPD (chronic obstructive pulmonary disease)   . Pneumonia 2009  . Anemia   . Adrenal mass     resected 2003 at time of urologic surgery for RP  fibrosis      Past Surgical History  Procedure Laterality Date  . Nose surgery      cauterized for numerous bleeds  . Kidney surgery    . Insertion of dialysis catheter    . US echocardiography    . Av fistula placement  07/11/2011    Procedure: ARTERIOVENOUS (AV) FISTULA CREATION;  Surgeon: Juleen China, MD;  Location: [facility] OR;  Service: Vascular;  Laterality: Left;  . Coronary angioplasty with stent placement  2010  . Diagnostic laparoscopy      remove scar tissue  . Eye surgery      right cataract surgery  . Cataract extraction w/phaco  08/14/2011    Procedure: CATARACT EXTRACTION PHACO AND INTRAOCULAR LENS PLACEMENT (IOC);  Surgeon: Chalmers Guest, MD;  Location: [facility] OR;  Service: Ophthalmology;  Laterality: Left;  . Ligation of arteriovenous  fistula  07/07/2012    Procedure: LIGATION OF ARTERIOVENOUS  FISTULA;  Surgeon: Sherren Kerns, MD;  Location: [facility] OR;  Service: Vascular;  Laterality: Left;  . Insertion of dialysis catheter   07/07/2012    Procedure: INSERTION OF DIALYSIS CATHETER;  Surgeon: Sherren Kerns, MD;  Location: [facility] OR;  Service: Vascular;  Laterality: Left;  Ultrasound guided  . Av fistula placement Left 10/01/2012    Procedure: INSERTION OF ARTERIOVENOUS (AV) GORE-TEX GRAFT ARM;  Surgeon: Nada Libman, MD;  Location: [facility] OR;  Service: Vascular;  Laterality: Left;  Ultrasound guided  . Chest tube insertion  2011    Family History  Problem Relation Age of Onset  . Heart disease Mother   . Hypertension Mother   . Heart disease Father   . COPD Father   . Hypertension Sister   . Heart disease Sister   . Anesthesia problems Neg Hx    Social History:  reports that he quit smoking about 49 years ago. His smoking use included Cigarettes. He has a 30 pack-year smoking history. He quit smokeless tobacco use about 75 years ago. He reports that he does not drink alcohol or use illicit drugs.  Allergies:  Allergies  Allergen Reactions  . Caffeine     Seventh day advantist  . Ibuprofen Other (See Comments)    Messed up kidneys  . Pork-Derived Products     Seventh day advantist  .  Shellfish Allergy     Seventh day advantist  . Tarka (Trandolapril-Verapamil Hcl Er) Cough     (Not in a hospital admission)  Results for orders placed during the hospital encounter of 11/23/12 (from the past 48 hour(s))  COMPREHENSIVE METABOLIC PANEL     Status: Abnormal   Collection Time    11/23/12  8:51 AM      Result Value Range   Sodium 135  135 - 145 mEq/L   Potassium 4.7  3.5 - 5.1 mEq/L   Chloride 93 (*) 96 - 112 mEq/L   CO2 23  19 - 32 mEq/L   Glucose, Bld 85  70 - 99 mg/dL   BUN 93 (*) 6 - 23 mg/dL   Creatinine, Ser 62.13 (*) 0.50 - 1.35 mg/dL   Calcium 08.6  8.4 - 57.8 mg/dL   Total Protein 8.9 (*) 6.0 - 8.3 g/dL   Albumin 3.3 (*) 3.5 - 5.2 g/dL   AST 24  0 - 37 U/L   ALT 17  0 - 53 U/L   Alkaline Phosphatase 88  39 - 117 U/L   Total Bilirubin 0.3  0.3 - 1.2 mg/dL   GFR calc non Af Amer 4 (*) >90  mL/min   GFR calc Af Amer 4 (*) >90 mL/min   Comment:            The eGFR has been calculated     using the CKD EPI equation.     This calculation has not been     validated in all clinical     situations.     eGFR's persistently     <90 mL/min signify     possible Chronic Kidney Disease.  CBC WITH DIFFERENTIAL     Status: Abnormal   Collection Time    11/23/12  8:51 AM      Result Value Range   WBC 10.0  4.0 - 10.5 K/uL   RBC 2.80 (*) 4.22 - 5.81 MIL/uL   Hemoglobin 7.9 (*) 13.0 - 17.0 g/dL   HCT 46.9 (*) 62.9 - 52.8 %   MCV 78.2  78.0 - 100.0 fL   MCH 28.2  26.0 - 34.0 pg   MCHC 36.1 (*) 30.0 - 36.0 g/dL   RDW 41.3 (*) 24.4 - 01.0 %   Platelets 269  150 - 400 K/uL   Neutrophils Relative 71  43 - 77 %   Neutro Abs 7.1  1.7 - 7.7 K/uL   Lymphocytes Relative 13  12 - 46 %   Lymphs Abs 1.3  0.7 - 4.0 K/uL   Monocytes Relative 7  3 - 12 %   Monocytes Absolute 0.7  0.1 - 1.0 K/uL   Eosinophils Relative 9 (*) 0 - 5 %   Eosinophils Absolute 0.9 (*) 0.0 - 0.7 K/uL   Basophils Relative 1  0 - 1 %   Basophils Absolute 0.1  0.0 - 0.1 K/uL  TROPONIN I     Status: None   Collection Time    11/23/12  8:51 AM      Result Value Range   Troponin I <0.30  <0.30 ng/mL   Comment:            Due to the release kinetics of cTnI,     a negative result within the first hours     of the onset of symptoms does not rule out     myocardial infarction with certainty.     If myocardial infarction  is still suspected,     repeat the test at appropriate intervals.  POCT I-STAT 3, BLOOD GAS (G3+)     Status: Abnormal   Collection Time    11/23/12 10:40 AM      Result Value Range   pH, Arterial 7.419  7.350 - 7.450   pCO2 arterial 38.2  35.0 - 45.0 mmHg   pO2, Arterial 141.0 (*) 80.0 - 100.0 mmHg   Bicarbonate 24.7 (*) 20.0 - 24.0 mEq/L   TCO2 26  0 - 100 mmol/L   O2 Saturation 99.0     Collection site RADIAL, ALLEN'S TEST ACCEPTABLE     Drawn by Operator     Sample type ARTERIAL     Dg  Chest Port 1 View  11/23/2012  *RADIOLOGY REPORT*  Clinical Data: Shortness of breath  PORTABLE CHEST - 1 VIEW  Comparison: November 09, 2012.  Findings: Stable mild cardiomegaly.  No change is noted in position of the right internal jugular dialysis catheter.  Right lung is clear.  Blunting of left costophrenic sulcus is noted which is unchanged most consistent with scarring.  No change is seen involving the left lower lobe mass compared to prior exam.  IMPRESSION: Overall, no significant change compared to prior exam.  There is continued presence of left lower lobe mass as described on prior studies which is unchanged.   Original Report Authenticated By: Lupita Raider.,  M.D.     @ROS @ Wears glasses, + hearing loss, Wears dentures. + COPD, + CAD, + end stage renal disease, hypertension, lung mass and multiple joint arthritis. No hepatitis, stroke and seizures.   Physical exam: Blood pressure 149/73, pulse 73, temperature 97.7 F (36.5 C), temperature source Oral, resp. rate 24, height 6\' 2"  (1.88 m), weight 79.379 kg (175 lb), SpO2 100.00%.  HEENT: [place]/AT. Brown eyes, conjunctiva-pale, Sclera- muddy. Wears reading glasses and partial upper dentures.  Neck: No JVD, supple.  Lungs-Few rhonchi, bilaterally.   CVS: Normal S1, S2. II/VI systolic murmur..  Abdomen: Soft, non-tender.  Extremities: Trace edema. No cyanosis or clubbing.  CNS; AXOX3. Moves all 4 ext.  Assessment/Plan Hemoptysis Shortness of breath  Anemia of chronic renal disease and blood loss Hypertension  CAD  Arthritis  End stage renal disease with hemodialysis. Lung mass with pleural effusion and hemoptysis-Chronic  Admit Monitor H & H. Renal consult Home medications. Hold Aspirin and plavix DNR.   Sultana Tierney S 11/23/2012, 1:47 PM

## 2012-11-23 NOTE — Telephone Encounter (Signed)
Pt wants to hold off on CT for now. [name], CMA

## 2012-11-23 NOTE — ED Provider Notes (Signed)
History     CSN: [id number]  Arrival date & time 11/23/12  7829   First MD Initiated Contact with Patient 11/23/12 [phone]      Chief Complaint  Patient presents with  . Shortness of Breath    (Consider location/radiation/quality/duration/timing/severity/associated sxs/prior treatment) HPI Patient presents to the ER with SOB over the last 2 days. The patient is due for dialysis today. The patient states he has not had any chest pain, abdominal pain, fever, weakness, nausea, vomiting, or diarrhea.  Patient, states, that he's been coughing up some frothy sputum.  Patient, states he did not take any medications prior to arrival. Past Medical History  Diagnosis Date  . Hypertension   . CAD (coronary artery disease)     hx MI x 4  . Hyperlipidemia   . Gout   . Arthritis   . Anxiety   . Wears hearing aid   . Neuromuscular disorder     sciatica right leg  . ESRD on hemodialysis     MWF at [facility], started HD on January 09, 2011.  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 Had bilat ureterolysis with omental wrap surgery 2003 by Dr. Patsi Sears.  . Cancer 2010    - monitoring- pt did not want tx  . Blood transfusion   . Dysrhythmia   . COPD (chronic obstructive pulmonary disease)   . Pneumonia 2009  . Anemia   . Adrenal mass     resected 2003 at time of urologic surgery for RP  fibrosis    Past Surgical History  Procedure Laterality Date  . Nose surgery      cauterized for numerous bleeds  . Kidney surgery    . Insertion of dialysis catheter    . US echocardiography    . Av fistula placement  07/11/2011    Procedure: ARTERIOVENOUS (AV) FISTULA CREATION;  Surgeon: Juleen China, MD;  Location: [facility] OR;  Service: Vascular;  Laterality: Left;  . Coronary angioplasty with stent placement  2010  . Diagnostic laparoscopy      remove scar tissue  . Eye surgery      right cataract surgery  . Cataract extraction w/phaco  08/14/2011    Procedure: CATARACT EXTRACTION  PHACO AND INTRAOCULAR LENS PLACEMENT (IOC);  Surgeon: Chalmers Guest, MD;  Location: [facility] OR;  Service: Ophthalmology;  Laterality: Left;  . Ligation of arteriovenous  fistula  07/07/2012    Procedure: LIGATION OF ARTERIOVENOUS  FISTULA;  Surgeon: Sherren Kerns, MD;  Location: [facility] OR;  Service: Vascular;  Laterality: Left;  . Insertion of dialysis catheter  07/07/2012    Procedure: INSERTION OF DIALYSIS CATHETER;  Surgeon: Sherren Kerns, MD;  Location: [facility] OR;  Service: Vascular;  Laterality: Left;  Ultrasound guided  . Av fistula placement Left 10/01/2012    Procedure: INSERTION OF ARTERIOVENOUS (AV) GORE-TEX GRAFT ARM;  Surgeon: Nada Libman, MD;  Location: [facility] OR;  Service: Vascular;  Laterality: Left;  Ultrasound guided  . Chest tube insertion  2011    Family History  Problem Relation Age of Onset  . Heart disease Mother   . Hypertension Mother   . Heart disease Father   . COPD Father   . Hypertension Sister   . Heart disease Sister   . Anesthesia problems Neg Hx     History  Substance Use Topics  . Smoking status: Former Smoker -- 1.00 packs/day for 30 years    Types: Cigarettes    Quit date: 05/31/1963  .  Smokeless tobacco: Former Neurosurgeon    Quit date: 04/07/1937  . Alcohol Use: No      Review of Systems All other systems negative except as documented in the HPI. All pertinent positives and negatives as reviewed in the HPI. Allergies  Caffeine; Ibuprofen; Pork-derived products; Shellfish allergy; and [place]  Home Medications   Current Outpatient Rx  Name  Route  Sig  Dispense  Refill  . albuterol (ACCUNEB) 0.63 MG/3ML nebulizer solution   Nebulization   Take 1 ampule by nebulization every 6 (six) hours as needed for wheezing.         Marland Kitchen albuterol (PROVENTIL HFA;VENTOLIN HFA) 108 (90 BASE) MCG/ACT inhaler   Inhalation   Inhale 1-2 puffs into the lungs every 6 (six) hours as needed. For shortness of breath         . amLODipine (NORVASC) 10 MG tablet   Oral    Take 10 mg by mouth every morning.         Marland Kitchen aspirin EC 81 MG tablet   Oral   Take 81 mg by mouth at bedtime. Does not take on Sunday's.         . calcium acetate, Phos Binder, (PHOSLYRA) 667 MG/5ML SOLN   Oral   Take 667 mg by mouth 3 (three) times daily with meals.         . cloNIDine (CATAPRES) 0.2 MG tablet   Oral   Take 0.2 mg by mouth 2 (two) times daily.         . clopidogrel (PLAVIX) 75 MG tablet   Oral   Take 75 mg by mouth every morning.          . ferrous sulfate ([employer] IRON) 325 (65 FE) MG tablet   Oral   Take 325 mg by mouth daily with breakfast.         . furosemide (LASIX) 40 MG tablet   Oral   Take 40 mg by mouth 2 (two) times daily.          . isosorbide mononitrate (IMDUR) 60 MG 24 hr tablet   Oral   Take 60 mg by mouth 2 (two) times daily.          Marland Kitchen lisinopril (PRINIVIL,ZESTRIL) 20 MG tablet   Oral   Take 20 mg by mouth at bedtime.          Marland Kitchen LORazepam (ATIVAN) 0.5 MG tablet   Oral   Take 0.5 mg by mouth at bedtime. For anxiety.         . lovastatin (MEVACOR) 20 MG tablet   Oral   Take 20 mg by mouth at bedtime.          . montelukast (SINGULAIR) 10 MG tablet   Oral   Take 10 mg by mouth at bedtime.          . multivitamin (RENA-VIT) TABS tablet   Oral   Take 1 tablet by mouth at bedtime.         Marland Kitchen NIFEdipine (NIFEDICAL XL) 60 MG 24 hr tablet   Oral   Take 60 mg by mouth every morning.          . nitroGLYCERIN (NITROSTAT) 0.4 MG SL tablet   Sublingual   Place 0.4 mg under the tongue every 5 (five) minutes as needed. For chest pain         . Nutritional Supplements (FEEDING SUPPLEMENT, NEPRO CARB STEADY,) LIQD   Oral   Take 237 mLs by mouth  daily.         . traMADol (ULTRAM) 50 MG tablet   Oral   Take 50 mg by mouth 2 (two) times daily as needed. For pain. Maximum dose= 8 tablets per day         . Whey Protein POWD   Oral   Take 1 application by mouth daily.           BP 127/70  Pulse 68   Temp(Src) 97.7 F (36.5 C) (Oral)  Resp 18  Ht 6\' 2"  (1.88 m)  Wt 175 lb (79.379 kg)  BMI 22.46 kg/m2  SpO2 100%  Physical Exam  Nursing note and vitals reviewed. Constitutional: He appears well-developed and well-nourished.  HENT:  Head: Normocephalic and atraumatic.  Mouth/Throat: Oropharynx is clear and moist.  Eyes: EOM are normal. Pupils are equal, round, and reactive to light.  Neck: Normal range of motion. Neck supple.  Cardiovascular: Normal rate, regular rhythm and normal heart sounds.  Exam reveals no gallop and no friction rub.   No murmur heard. Pulmonary/Chest: Effort normal. No respiratory distress. He has rales.  Patient came in on BiPAP but in my opinion did not need to be on this.  The patient has maintained.  His oxygen saturations around 100% on room air.  Neurological: He is alert.  Skin: Skin is warm and dry. No rash noted.    ED Course  Procedures (including critical care time)  Labs Reviewed  COMPREHENSIVE METABOLIC PANEL - Abnormal; Notable for the following:    Chloride 93 (*)    BUN 93 (*)    Creatinine, Ser 10.68 (*)    Total Protein 8.9 (*)    Albumin 3.3 (*)    GFR calc non Af Amer 4 (*)    GFR calc Af Amer 4 (*)    All other components within normal limits  CBC WITH DIFFERENTIAL - Abnormal; Notable for the following:    RBC 2.80 (*)    Hemoglobin 7.9 (*)    HCT 21.9 (*)    MCHC 36.1 (*)    RDW 18.1 (*)    Eosinophils Relative 9 (*)    Eosinophils Absolute 0.9 (*)    All other components within normal limits  POCT I-STAT 3, BLOOD GAS (G3+) - Abnormal; Notable for the following:    pO2, Arterial 141.0 (*)    Bicarbonate 24.7 (*)    All other components within normal limits  TROPONIN I  PREPARE RBC (CROSSMATCH)  TYPE AND SCREEN   Dg Chest Port 1 View  11/23/2012  *RADIOLOGY REPORT*  Clinical Data: Shortness of breath  PORTABLE CHEST - 1 VIEW  Comparison: November 09, 2012.  Findings: Stable mild cardiomegaly.  No change is noted in  position of the right internal jugular dialysis catheter.  Right lung is clear.  Blunting of left costophrenic sulcus is noted which is unchanged most consistent with scarring.  No change is seen involving the left lower lobe mass compared to prior exam.  IMPRESSION: Overall, no significant change compared to prior exam.  There is continued presence of left lower lobe mass as described on prior studies which is unchanged.   Original Report Authenticated By: Lupita Raider.,  M.D.      1. SOB (shortness of breath)    Patient be admitted for the hospital for shortness of breath, and need for dialysis, which will most likely resolve the shortness of breath.  Patient is advised of the plan and all questions were answered.  I spoke with Dr. Arlean Hopping and Dr. Algie Coffer.  Patient has remained stable here in the emergency department  MDM   Date: 11/23/2012  Rate: 71  Rhythm: normal sinus rhythm  QRS Axis: normal  Intervals: normal  ST/T Wave abnormalities: nonspecific ST/T changes  Conduction Disutrbances:none  Narrative Interpretation:   Old EKG Reviewed: unchanged          Carlyle Dolly, PA-C 11/26/12 2325

## 2012-11-23 NOTE — ED Notes (Signed)
Per report from [facility] pt began to have SOB onset yesterday.  On their arrival pt noted to be tachypneaic with pink frothy sputum.  CPAP initiated with success and 1 SL NTG 0.4mg .  On arrival to ER pt noted to be less distressed than what EMS reported on their arrival.  Pt reports feeling less distressed.

## 2012-11-24 LAB — CBC
MCH: 27.5 pg (ref 26.0–34.0)
MCH: 27.9 pg (ref 26.0–34.0)
MCHC: 35.8 g/dL (ref 30.0–36.0)
MCHC: 36.3 g/dL — ABNORMAL HIGH (ref 30.0–36.0)
Platelets: 236 10*3/uL (ref 150–400)
Platelets: 240 10*3/uL (ref 150–400)
RBC: 2.47 MIL/uL — ABNORMAL LOW (ref 4.22–5.81)
RDW: 17.6 % — ABNORMAL HIGH (ref 11.5–15.5)
RDW: 18.5 % — ABNORMAL HIGH (ref 11.5–15.5)

## 2012-11-24 LAB — BASIC METABOLIC PANEL
BUN: 99 mg/dL — ABNORMAL HIGH (ref 6–23)
Calcium: 9.4 mg/dL (ref 8.4–10.5)
Creatinine, Ser: 11.61 mg/dL — ABNORMAL HIGH (ref 0.50–1.35)
GFR calc non Af Amer: 3 mL/min — ABNORMAL LOW (ref >90)
Glucose, Bld: 81 mg/dL (ref 70–99)
Sodium: 133 mEq/L — ABNORMAL LOW (ref 135–145)

## 2012-11-24 MED ORDER — TRAMADOL HCL 50 MG PO TABS
50.0000 mg | ORAL_TABLET | Freq: Four times a day (QID) | ORAL | Status: DC | PRN
  Filled 2012-11-24: qty 1

## 2012-11-24 MED ORDER — DARBEPOETIN ALFA-POLYSORBATE 100 MCG/0.5ML IJ SOLN
INTRAMUSCULAR | Status: AC
  Administered 2012-11-24: 100 ug via INTRAVENOUS
  Filled 2012-11-24: qty 0.5

## 2012-11-24 NOTE — Progress Notes (Signed)
Subjective:  Cough, hemoptysis, anemia and shortness of breath continues. T max 99.1  Objective:  Vital Signs in the last 24 hours: Temp:  [97.7 F (36.5 C)-99.1 F (37.3 C)] 98.5 F (36.9 C) (05/06 0426) Pulse Rate:  [68-91] 86 (05/06 0426) Cardiac Rhythm:  [-] Normal sinus rhythm (05/05 0856) Resp:  [15-24] 20 (05/06 0426) BP: (127-177)/(64-107) 154/76 mmHg (05/06 0426) SpO2:  [96 %-100 %] 100 % (05/06 0426) FiO2 (%):  [35 %] 35 % (05/05 0850) Weight:  [79.379 kg (175 lb)-82.4 kg (181 lb 10.5 oz)] 80.2 kg (176 lb 12.9 oz) (05/06 0304)  Physical Exam: BP Readings from Last 1 Encounters:  11/24/12 154/76     Wt Readings from Last 1 Encounters:  11/24/12 80.2 kg (176 lb 12.9 oz)    Weight change:   HEENT: [place]/AT, Eyes-Brown, PERL, EOMI, Conjunctiva-Pale, Sclera-Non-icteric Neck: No JVD, No bruit, Trachea midline. Lungs:  Crackles, Bilateral. Cardiac:  Regular rhythm, normal S1 and S2, no S3.  Abdomen:  Soft, non-tender. Extremities:  No edema present. No cyanosis. No clubbing. R IJ HD cath and old access in [place]. CNS: AxOx3, Cranial nerves grossly intact, moves all 4 extremities. Right handed. Skin: Warm and dry.   Intake/Output from previous day: 05/05 0701 - 05/06 0700 In: 350 [Blood:350] Out: 2975     Lab Results: BMET    Component Value Date/Time   NA 133* 11/24/2012 0005   K 5.1 11/24/2012 0005   CL 92* 11/24/2012 0005   CO2 22 11/24/2012 0005   GLUCOSE 81 11/24/2012 0005   BUN 99* 11/24/2012 0005   CREATININE 11.61* 11/24/2012 0005   CALCIUM 9.4 11/24/2012 0005   GFRNONAA 3* 11/24/2012 0005   GFRAA 4* 11/24/2012 0005   CBC    Component Value Date/Time   WBC 8.6 11/24/2012 0005   RBC 2.47* 11/24/2012 0005   HGB 6.9* 11/24/2012 0005   HCT 19.0* 11/24/2012 0005   PLT 240 11/24/2012 0005   MCV 76.9* 11/24/2012 0005   MCH 27.9 11/24/2012 0005   MCHC 36.3* 11/24/2012 0005   RDW 18.5* 11/24/2012 0005   LYMPHSABS 1.3 11/23/2012 0851   MONOABS 0.7 11/23/2012 0851   EOSABS 0.9* 11/23/2012 0851   BASOSABS 0.1 11/23/2012 0851   CARDIAC ENZYMES Lab Results  Component Value Date   CKTOTAL 213 01/20/2012   CKMB 4.9* 01/20/2012   TROPONINI <0.30 11/23/2012    Scheduled Meds: . amLODipine  10 mg Oral q morning - 10a  . calcium acetate (Phos Binder)  667 mg Oral TID WC  . cloNIDine  0.2 mg Oral BID  . darbepoetin (ARANESP) injection - DIALYSIS  100 mcg Intravenous Q Mon-HD  . ferrous sulfate  325 mg Oral Q breakfast  . isosorbide mononitrate  60 mg Oral BID  . lisinopril  20 mg Oral QHS  . LORazepam  0.5 mg Oral QHS  . montelukast  10 mg Oral QHS  . simvastatin  5 mg Oral q1800  . sodium chloride  3 mL Intravenous Q12H   Continuous Infusions:  PRN Meds:.sodium chloride, albuterol, guaiFENesin-dextromethorphan, nitroGLYCERIN, ondansetron (ZOFRAN) IV, ondansetron, sodium chloride, traMADol  Assessment/Plan: Hemoptysis  Shortness of breath  Anemia of chronic renal disease and blood loss  Hypertension  CAD  Arthritis  End stage renal disease with hemodialysis.  Lung mass with pleural effusion and hemoptysis-Chronic Secondary hyperparathyroidism  Continue off aspirin and plavix. Recheck CBC. Consider comfort care if OK with wife and patient.   LOS: 1 day    Orpah Cobb  MD  11/24/2012, 8:02 AM

## 2012-11-24 NOTE — Progress Notes (Signed)
Utilization review completed.  

## 2012-11-24 NOTE — Progress Notes (Signed)
Subjective: No complaints, not coughing up as much blood. ASA and plavix were stopped by primary MD  Objective Vital signs in last 24 hours: Filed Vitals:   11/24/12 0310 11/24/12 0426 11/24/12 1000 11/24/12 1400  BP: 135/74 154/76 165/86 148/84  Pulse: 86 86 79 82  Temp:  98.5 F (36.9 C) 98.2 F (36.8 C) 98.6 F (37 C)  TempSrc:  Oral Oral Oral  Resp: 21 20 20 18   Height:      Weight:      SpO2: 97% 100% 100% 100%   Weight change:   Intake/Output Summary (Last 24 hours) at 11/24/12 1536 Last data filed at 11/24/12 1300  Gross per 24 hour  Intake    830 ml  Output   2975 ml  Net  -2145 ml   Labs: Basic Metabolic Panel:  Recent Labs Lab 11/23/12 0851 11/24/12 0005  NA 135 133*  K 4.7 5.1  CL 93* 92*  CO2 23 22  GLUCOSE 85 81  BUN 93* 99*  CREATININE 10.68* 11.61*  CALCIUM 10.0 9.4   Liver Function Tests:  Recent Labs Lab 11/23/12 0851  AST 24  ALT 17  ALKPHOS 88  BILITOT 0.3  PROT 8.9*  ALBUMIN 3.3*   No results found for this basename: LIPASE, AMYLASE,  in the last 168 hours No results found for this basename: AMMONIA,  in the last 168 hours CBC:  Recent Labs Lab 11/23/12 0851 11/24/12 0005 11/24/12 0852  WBC 10.0 8.6 9.0  NEUTROABS 7.1  --   --   HGB 7.9* 6.9* 8.3*  HCT 21.9* 19.0* 23.2*  MCV 78.2 76.9* 76.8*  PLT 269 240 236   PT/INR: @LABRCNTIP (inr:5)   Scheduled Meds ) . amLODipine  10 mg Oral q morning - 10a  . calcium acetate (Phos Binder)  667 mg Oral TID WC  . cloNIDine  0.2 mg Oral BID  . darbepoetin (ARANESP) injection - DIALYSIS  100 mcg Intravenous Q Mon-HD  . ferrous sulfate  325 mg Oral Q breakfast  . isosorbide mononitrate  60 mg Oral BID  . lisinopril  20 mg Oral QHS  . LORazepam  0.5 mg Oral QHS  . montelukast  10 mg Oral QHS  . simvastatin  5 mg Oral q1800  . sodium chloride  3 mL Intravenous Q12H    Physical Exam:  Blood pressure 148/84, pulse 82, temperature 98.6 F (37 C), temperature source Oral, resp.  rate 18, height 6\' 2"  (1.88 m), weight 80.2 kg (176 lb 12.9 oz), SpO2 100.00%.  Gen: alert elderly male in no distress  HEENT: EOMI, sclera anicteric, throat clear  Neck: no JVD, no LAN,  Chest: clear on R, loud rhonchi, and coarse wheezed L base 1/2 up  CV: regular, no rub or gallop, pedal pulses intact  Abdomen: soft, nondistended, no ascites or HSM  Ext: no LE edema, no joint effusion or deformity, no gangrene or ulceration  Neuro: alert, Ox3, no focal deficit  Access: R IJ HD cath, old accesses in LUA   Outpatient HD[facility] 4 hrs 80kg IJ cath, Profile 4/VNa 148 2K/2.25Ca EPO 20K Hep bolus was 8000 prior to admission, see changes below Venofer 100 q Rx   Impression/Plan  1. Hemoptysis- pt with known lung mass, presumably cancer. Plavix and ASA have been stopped. Will reduce heparin to 2000 units with HD (was getting 8000 units).  2. ESRD, cont HD MWF. Reduced heparin to 2000 units with HD. Using AVG > 1 week,  will consult [facility] for PCath removal while in hospital 3. HTN/volume- 3 kg off yesterday. Is at dry weight today 4. Anemia of CKD- Hb up to 8.3 after transfusion 1 unit prbc on 5/5 5. Secondary HPTH- takes phoslo as binder, not on vit D 6. Hx CAD / MI 7. DNR 8. Hx COPD    Vinson Moselle  MD [phone] pgr    [phone] cell 11/24/2012, 3:36 PM

## 2012-11-24 NOTE — Telephone Encounter (Signed)
Noted he is inpatient under Dr Algie Coffer care

## 2012-11-24 NOTE — Progress Notes (Signed)
  Echocardiogram 2D Echocardiogram has been performed.  [name] 11/24/2012, 3:16 PM

## 2012-11-25 ENCOUNTER — Encounter (HOSPITAL_COMMUNITY): Payer: Self-pay | Admitting: Emergency Medicine

## 2012-11-25 DIAGNOSIS — N186 End stage renal disease: Secondary | ICD-10-CM

## 2012-11-25 LAB — TYPE AND SCREEN: ABO/RH(D): O POS

## 2012-11-25 LAB — RENAL FUNCTION PANEL
Calcium: 9.1 mg/dL (ref 8.4–10.5)
Creatinine, Ser: 8.14 mg/dL — ABNORMAL HIGH (ref 0.50–1.35)
GFR calc Af Amer: 6 mL/min — ABNORMAL LOW (ref >90)
GFR calc non Af Amer: 5 mL/min — ABNORMAL LOW (ref >90)
Phosphorus: 5.2 mg/dL — ABNORMAL HIGH (ref 2.3–4.6)
Sodium: 133 mEq/L — ABNORMAL LOW (ref 135–145)

## 2012-11-25 LAB — CBC
MCH: 27.6 pg (ref 26.0–34.0)
MCHC: 35.8 g/dL (ref 30.0–36.0)
RDW: 17.4 % — ABNORMAL HIGH (ref 11.5–15.5)

## 2012-11-25 NOTE — Procedures (Signed)
I was present at this dialysis session. I have reviewed the session itself and made appropriate changes.   Vinson Moselle, MD [employer] 11/25/2012, 11:05 AM

## 2012-11-25 NOTE — Progress Notes (Signed)
Subjective:  Feeling better. Decreased cough and hemoptysis.  Objective:  Vital Signs in the last 24 hours: Temp:  [97.8 F (36.6 C)-99.4 F (37.4 C)] 98.1 F (36.7 C) (05/07 0751) Pulse Rate:  [58-82] 64 (05/07 0930) Cardiac Rhythm:  [-]  Resp:  [18-20] 18 (05/07 0751) BP: (141-169)/(63-89) 163/64 mmHg (05/07 0930) SpO2:  [99 %-100 %] 99 % (05/07 0751) Weight:  [78.4 kg (172 lb 13.5 oz)-80.199 kg (176 lb 12.9 oz)] 78.4 kg (172 lb 13.5 oz) (05/07 0751)  Physical Exam: BP Readings from Last 1 Encounters:  11/25/12 163/64     Wt Readings from Last 1 Encounters:  11/25/12 78.4 kg (172 lb 13.5 oz)    Weight change: 0.819 kg (1 lb 12.9 oz)  HEENT: [place]/AT, Eyes-Brown, PERL, EOMI, Conjunctiva-Pale, Sclera-Non-icteric Neck: No JVD, No bruit, Trachea midline. Right IJ HD catheter. Lungs:  Clear, Bilateral. Cardiac:  Regular rhythm, normal S1 and S2, no S3.  Abdomen:  Soft, non-tender. Extremities:  No edema present. No cyanosis. No clubbing. Old LUA AV fistula CNS: AxOx3, Cranial nerves grossly intact, moves all 4 extremities. Right handed. Skin: Warm and dry.   Intake/Output from previous day: 05/06 0701 - 05/07 0700 In: 720 [P.O.:720] Out: -     Lab Results: BMET    Component Value Date/Time   NA 133* 11/25/2012 0500   K 4.3 11/25/2012 0500   CL 94* 11/25/2012 0500   CO2 27 11/25/2012 0500   GLUCOSE 87 11/25/2012 0500   BUN 57* 11/25/2012 0500   CREATININE 8.14* 11/25/2012 0500   CALCIUM 9.1 11/25/2012 0500   GFRNONAA 5* 11/25/2012 0500   GFRAA 6* 11/25/2012 0500   CBC    Component Value Date/Time   WBC 7.0 11/25/2012 0500   RBC 2.75* 11/25/2012 0500   HGB 7.6* 11/25/2012 0500   HCT 21.2* 11/25/2012 0500   PLT 222 11/25/2012 0500   MCV 77.1* 11/25/2012 0500   MCH 27.6 11/25/2012 0500   MCHC 35.8 11/25/2012 0500   RDW 17.4* 11/25/2012 0500   LYMPHSABS 1.3 11/23/2012 0851   MONOABS 0.7 11/23/2012 0851   EOSABS 0.9* 11/23/2012 0851   BASOSABS 0.1 11/23/2012 0851   CARDIAC ENZYMES Lab Results   Component Value Date   CKTOTAL 213 01/20/2012   CKMB 4.9* 01/20/2012   TROPONINI <0.30 11/23/2012    Scheduled Meds: . amLODipine  10 mg Oral q morning - 10a  . calcium acetate (Phos Binder)  667 mg Oral TID WC  . cloNIDine  0.2 mg Oral BID  . darbepoetin (ARANESP) injection - DIALYSIS  100 mcg Intravenous Q Mon-HD  . ferrous sulfate  325 mg Oral Q breakfast  . isosorbide mononitrate  60 mg Oral BID  . lisinopril  20 mg Oral QHS  . LORazepam  0.5 mg Oral QHS  . montelukast  10 mg Oral QHS  . simvastatin  5 mg Oral q1800  . sodium chloride  3 mL Intravenous Q12H   Continuous Infusions:  PRN Meds:.sodium chloride, albuterol, guaiFENesin-dextromethorphan, nitroGLYCERIN, ondansetron (ZOFRAN) IV, ondansetron, sodium chloride, traMADol  Assessment/Plan: Hemoptysis  Shortness of breath  Above due to Lung mass with pleural effusion and hemoptysis-Chronic Anemia of chronic renal disease and blood loss  Hypertension  CAD  Arthritis  End stage renal disease with hemodialysis.   Continue medical treatment. Home soon.    LOS: 2 days    Orpah Cobb  MD  11/25/2012, 9:57 AM

## 2012-11-25 NOTE — Progress Notes (Signed)
[facility] Catheter Removal Procedure Note  Diagnosis: ESRD with Functioning AVF/AVGG  Plan:  Remove right diatek catheter  Consent signed:  yes Time out completed:  yes Coumadin:  no PT/INR (if applicable):   Other labs:   Procedure: 1.  Sterile prepping and draping over catheter area 2. 10 ml 2% lidocaine plain instilled at removal site. 3.  right catheter removed in its entirety with cuff in tact. 4.  Complications: none  5. Tip of catheter sent for culture:  no   Patient tolerated procedure well:  yes Pressure held, no bleeding noted, dressing applied Instructions given to the pt regarding wound care and bleeding.  Other:  Ewen Varnell J 11/25/2012 2:06 PM

## 2012-11-25 NOTE — Progress Notes (Signed)
Subjective: Still having hemoptysis  Objective Vital signs in last 24 hours: Filed Vitals:   11/25/12 1100 11/25/12 1130 11/25/12 1200 11/25/12 1213  BP: 175/84 169/84 166/82 177/85  Pulse: 66 63 69 59  Temp:      TempSrc:      Resp:      Height:      Weight:      SpO2:       Weight change: 0.819 kg (1 lb 12.9 oz)  Intake/Output Summary (Last 24 hours) at 11/25/12 1221 Last data filed at 11/25/12 1213  Gross per 24 hour  Intake    480 ml  Output   2500 ml  Net  -2020 ml   Labs: Basic Metabolic Panel:  Recent Labs Lab 11/23/12 0851 11/24/12 0005 11/25/12 0500  NA 135 133* 133*  K 4.7 5.1 4.3  CL 93* 92* 94*  CO2 23 22 27   GLUCOSE 85 81 87  BUN 93* 99* 57*  CREATININE 10.68* 11.61* 8.14*  CALCIUM 10.0 9.4 9.1  PHOS  --   --  5.2*   Liver Function Tests:  Recent Labs Lab 11/23/12 0851 11/25/12 0500  AST 24  --   ALT 17  --   ALKPHOS 88  --   BILITOT 0.3  --   PROT 8.9*  --   ALBUMIN 3.3* 2.7*   No results found for this basename: LIPASE, AMYLASE,  in the last 168 hours No results found for this basename: AMMONIA,  in the last 168 hours CBC:  Recent Labs Lab 11/23/12 0851 11/24/12 0005 11/24/12 0852 11/25/12 0500  WBC 10.0 8.6 9.0 7.0  NEUTROABS 7.1  --   --   --   HGB 7.9* 6.9* 8.3* 7.6*  HCT 21.9* 19.0* 23.2* 21.2*  MCV 78.2 76.9* 76.8* 77.1*  PLT 269 240 236 222   PT/INR: @LABRCNTIP (inr:5)   Scheduled Meds ) . amLODipine  10 mg Oral q morning - 10a  . calcium acetate (Phos Binder)  667 mg Oral TID WC  . cloNIDine  0.2 mg Oral BID  . darbepoetin (ARANESP) injection - DIALYSIS  100 mcg Intravenous Q Mon-HD  . ferrous sulfate  325 mg Oral Q breakfast  . isosorbide mononitrate  60 mg Oral BID  . lisinopril  20 mg Oral QHS  . LORazepam  0.5 mg Oral QHS  . montelukast  10 mg Oral QHS  . simvastatin  5 mg Oral q1800  . sodium chloride  3 mL Intravenous Q12H    Physical Exam:  Blood pressure 177/85, pulse 59, temperature 98.1 F (36.7  C), temperature source Oral, resp. rate 18, height 6\' 2"  (1.88 m), weight 78.4 kg (172 lb 13.5 oz), SpO2 99.00%.  Gen: alert elderly male in no distress  HEENT: EOMI, sclera anicteric, throat clear  Neck: no JVD, no LAN,  Chest: clear on R, loud rhonchi, and coarse wheezed L base 1/2 up  CV: regular, no rub or gallop, pedal pulses intact  Abdomen: soft, nondistended, no ascites or HSM  Ext: no LE edema, no joint effusion or deformity, no gangrene or ulceration  Neuro: alert, Ox3, no focal deficit  Access: R IJ HD cath, old accesses in L[facility]   Outpatient HD: [facility] F[facility] 4 hrs 80kg IJ cath, Profile 4/VNa 148 2K/2.25Ca EPO 20K Hep bolus was 8000 prior to admission, see changes below Venofer 100 q Rx   Impression/Plan  1. Hemoptysis- pt with known lung mass, presumably cancer. Plavix and ASA have been stopped.  2. ESRD, cont HD MWF. Hemoptysis persists, will stop heparin altogether with HD. He did get 2000 units today.  Using AVG > 1 week, have consulted [facility] for PCath removal while in hospital 3. HTN/volume- stable, no vol excess. HD today 4. Anemia of CKD- Hb up to 8.3 after transfusion 1 unit prbc on 5/5 5. Secondary HPTH- takes phoslo as binder, not on vit D 6. Hx CAD / MI 7. DNR 8. Hx COPD    Vinson Moselle  MD [phone] pgr    [phone] cell 11/25/2012, 12:21 PM

## 2012-11-25 NOTE — Progress Notes (Signed)
[facility] DIATEK REMOVAL H&P  CC:  Catheter removal   HPI:  This is a 79 y.o. male here for diatek catheter removal.  He has been on HD since June '12.  He has a hx of a left lung mass that is presumed to be cancer.  He and his family have declined work up.  He does have hemoptysis.  He has been taken off his plavix and asa.  Heparin will be stopped with HD after today.  He does have a working AVG that has been accessed greater than one week.  We are consulted for catheter removal.   Past Medical History  Diagnosis Date  . Hypertension   . CAD (coronary artery disease)     hx MI x 4  . Hyperlipidemia   . Gout   . Arthritis   . Anxiety   . Wears hearing aid   . Neuromuscular disorder     sciatica right leg  . ESRD on hemodialysis     MWF at [facility], started HD on January 09, 2011.  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 Had bilat ureterolysis with omental wrap surgery 2003 by Dr. Patsi Sears.  . Cancer 2010    - monitoring- pt did not want tx  . Blood transfusion   . Dysrhythmia   . COPD (chronic obstructive pulmonary disease)   . Pneumonia 2009  . Anemia   . Adrenal mass     resected 2003 at time of urologic surgery for RP  fibrosis    FH:  Non-Contributory  History   Social History  . Marital Status: Married    Spouse Name: N/A    Number of Children: N/A  . Years of Education: N/A   Occupational History  . Not on file.   Social History Main Topics  . Smoking status: Former Smoker -- 1.00 packs/day for 30 years    Types: Cigarettes    Quit date: 05/31/1963  . Smokeless tobacco: Former Neurosurgeon    Quit date: 04/07/1937  . Alcohol Use: No  . Drug Use: No  . Sexually Active: Not on file   Other Topics Concern  . Not on file   Social History Narrative  . No narrative on file    Allergies  Allergen Reactions  . Caffeine     Seventh day advantist  . Ibuprofen Other (See Comments)    Messed up kidneys  . Pork-Derived  Products     Seventh day advantist  . Shellfish Allergy     Seventh day advantist  . Tarka (Trandolapril-Verapamil Hcl Er) Cough    Current Facility-Administered Medications  Medication Dose Route Frequency Provider Last Rate Last Dose  . 0.9 %  sodium chloride infusion  250 mL Intravenous PRN Ricki Rodriguez, MD      . albuterol (PROVENTIL) (5 MG/ML) 0.5% nebulizer solution 2.5 mg  2.5 mg Nebulization Q6H PRN Ricki Rodriguez, MD      . amLODipine (NORVASC) tablet 10 mg  10 mg Oral q morning - 10a Ricki Rodriguez, MD   10 mg at 11/25/12 1319  . calcium acetate (Phos Binder) (PHOSLYRA) 667 MG/5ML oral solution 667 mg  667 mg Oral TID WC Ricki Rodriguez, MD   667 mg at 11/25/12 1319  . cloNIDine (CATAPRES) tablet 0.2 mg  0.2 mg Oral BID Ricki Rodriguez, MD   0.2 mg at 11/25/12 1319  . darbepoetin (ARANESP) injection 100 mcg  100 mcg Intravenous Q Mon-HD  Maree Krabbe, MD   100 mcg at 11/24/12 0318  . ferrous sulfate tablet 325 mg  325 mg Oral Q breakfast Ricki Rodriguez, MD   325 mg at 11/25/12 1319  . guaiFENesin-dextromethorphan (ROBITUSSIN DM) 100-10 MG/5ML syrup 5 mL  5 mL Oral Q4H PRN Ricki Rodriguez, MD      . isosorbide mononitrate (IMDUR) 24 hr tablet 60 mg  60 mg Oral BID Ricki Rodriguez, MD   60 mg at 11/25/12 1319  . lisinopril (PRINIVIL,ZESTRIL) tablet 20 mg  20 mg Oral QHS Ricki Rodriguez, MD   20 mg at 11/25/12 0017  . LORazepam (ATIVAN) tablet 0.5 mg  0.5 mg Oral QHS Ricki Rodriguez, MD   0.5 mg at 11/25/12 0025  . montelukast (SINGULAIR) tablet 10 mg  10 mg Oral QHS Ricki Rodriguez, MD   10 mg at 11/25/12 0018  . nitroGLYCERIN (NITROSTAT) SL tablet 0.4 mg  0.4 mg Sublingual Q5 min PRN Ricki Rodriguez, MD      . ondansetron [facility]) tablet 4 mg  4 mg Oral Q6H PRN Ricki Rodriguez, MD       Or  . ondansetron (ZOFRAN) injection 4 mg  4 mg Intravenous Q6H PRN Ricki Rodriguez, MD      . simvastatin (ZOCOR) tablet 5 mg  5 mg Oral q1800 Ricki Rodriguez, MD   5 mg at 11/24/12 1854  . sodium  chloride 0.9 % injection 3 mL  3 mL Intravenous Q12H Ricki Rodriguez, MD   3 mL at 11/25/12 0018  . sodium chloride 0.9 % injection 3 mL  3 mL Intravenous PRN Ricki Rodriguez, MD      . traMADol Janean Sark) tablet 50 mg  50 mg Oral Q6H PRN Ricki Rodriguez, MD        ROS:  See HPI  PHYSICAL EXAM  Filed Vitals:   11/25/12 1213  BP: 177/85  Pulse: 59  Temp: 98.4 F (36.9 C)  Resp: 18    Gen:  Well developed well nourished HEENT:  normocephalic Neck:  Right IJ diatek catheter in place Heart:  regular Lungs:  Non-labored Extremities:  + bruit present in left FA AVG Skin:  No obvious rashes Neuro:  In tact  Lab/X-ray:  Impression: This is a 79 y.o. male here for diatek catheter removal as his left FA AVG has been working without difficulty for more than a week.  Plan:  Removal of right diatek catheter  Doreatha Massed, PA-C [facility] [phone] 11/25/2012 1:37 PM

## 2012-11-26 ENCOUNTER — Encounter (HOSPITAL_COMMUNITY): Payer: Medicare Other

## 2012-11-26 LAB — RENAL FUNCTION PANEL
BUN: 36 mg/dL — ABNORMAL HIGH (ref 6–23)
Calcium: 8.8 mg/dL (ref 8.4–10.5)
Creatinine, Ser: 5.12 mg/dL — ABNORMAL HIGH (ref 0.50–1.35)
GFR calc Af Amer: 11 mL/min — ABNORMAL LOW (ref >90)
Glucose, Bld: 80 mg/dL (ref 70–99)
Phosphorus: 4 mg/dL (ref 2.3–4.6)
Sodium: 136 mEq/L (ref 135–145)

## 2012-11-26 LAB — CBC
Hemoglobin: 7.7 g/dL — ABNORMAL LOW (ref 13.0–17.0)
MCH: 28 pg (ref 26.0–34.0)
MCHC: 36 g/dL (ref 30.0–36.0)
MCV: 77.8 fL — ABNORMAL LOW (ref 78.0–100.0)

## 2012-11-26 NOTE — Progress Notes (Signed)
Per MD, if pt and spouse feel pt is ready to d/c home, discharge today potential. MD requests that he be notified when spouse is in room and he will speak with them regarding palliative consult and discharge. Will continue to monitor.

## 2012-11-26 NOTE — Progress Notes (Signed)
Subjective:  No cos, tolerated hd yesterday, noted plans for possible dc today" IF WIFE able to take care of him at Home" Objective Vital signs in last 24 hours: Filed Vitals:   11/25/12 1800 11/25/12 1953 11/26/12 0540 11/26/12 0900  BP: 127/63 132/72 149/68 150/73  Pulse: 65 68 65 67  Temp: 98.2 F (36.8 C) 98.1 F (36.7 C) 98.7 F (37.1 C) 98.4 F (36.9 C)  TempSrc: Oral Oral Oral Oral  Resp: 20 20 20 18   Height:  6\' 2"  (1.88 m)    Weight:  78.7 kg (173 lb 8 oz)    SpO2: 100% 100% 98% 97%   Weight change: -1.799 kg (-3 lb 15.5 oz)  Intake/Output Summary (Last 24 hours) at 11/26/12 1021 Last data filed at 11/26/12 0901  Gross per 24 hour  Intake    480 ml  Output   2500 ml  Net  -2020 ml   Labs: Basic Metabolic Panel:  Recent Labs Lab 11/24/12 0005 11/25/12 0500 11/26/12 0440  NA 133* 133* 136  K 5.1 4.3 4.0  CL 92* 94* 97  CO2 22 27 27   GLUCOSE 81 87 80  BUN 99* 57* 36*  CREATININE 11.61* 8.14* 5.12*  CALCIUM 9.4 9.1 8.8  PHOS  --  5.2* 4.0   Liver Function Tests:  Recent Labs Lab 11/23/12 0851 11/25/12 0500 11/26/12 0440  AST 24  --   --   ALT 17  --   --   ALKPHOS 88  --   --   BILITOT 0.3  --   --   PROT 8.9*  --   --   ALBUMIN 3.3* 2.7* 2.6*   No results found for this basename: LIPASE, AMYLASE,  in the last 168 hours No results found for this basename: AMMONIA,  in the last 168 hours CBC:  Recent Labs Lab 11/23/12 0851 11/24/12 0005 11/24/12 0852 11/25/12 0500 11/26/12 0440  WBC 10.0 8.6 9.0 7.0 7.1  NEUTROABS 7.1  --   --   --   --   HGB 7.9* 6.9* 8.3* 7.6* 7.7*  HCT 21.9* 19.0* 23.2* 21.2* 21.4*  MCV 78.2 76.9* 76.8* 77.1* 77.8*  PLT 269 240 236 222 212   Cardiac Enzymes:  Recent Labs Lab 11/23/12 0851  TROPONINI <0.30   CBG: No results found for this basename: GLUCAP,  in the last 168 hours  Iron Studies: No results found for this basename: IRON, TIBC, TRANSFERRIN, FERRITIN,  in the last 72 hours Studies/Results: No  results found. Medications:   . amLODipine  10 mg Oral q morning - 10a  . calcium acetate (Phos Binder)  667 mg Oral TID WC  . cloNIDine  0.2 mg Oral BID  . darbepoetin (ARANESP) injection - DIALYSIS  100 mcg Intravenous Q Mon-HD  . ferrous sulfate  325 mg Oral Q breakfast  . isosorbide mononitrate  60 mg Oral BID  . lisinopril  20 mg Oral QHS  . LORazepam  0.5 mg Oral QHS  . montelukast  10 mg Oral QHS  . simvastatin  5 mg Oral q1800  . sodium chloride  3 mL Intravenous Q12H   I  have reviewed scheduled and prn medications.  Physical Exam: General: Alert thin BM ,NAD Appropriate  Chest: clear on R,  Faint wheeze on left CV: regular, no rub  Abdomen: soft, nondistended, no ascites or HSM  Ext: no LE edema,   Access: R IJ HD cath removed yesterday, old accesses in LUA  [facility] 4 hrs 80kg IJ cath, Profile 4/VNa 148 2K/2.25Ca EPO 20K Hep bolus was 8000 prior to admission, see changes below Venofer 100 q Rx  Impression/Plan  1. Hemoptysis- pt with known lung mass, presumably cancer.No wu planned per pt. Wishes/ Plavix and ASA have been stopped. Heparin with HD has been stopped.  2. ESRD, cont HD MWF( [facility]). Used AVG yesterday, permcath out. 3. HTN/volume- stable, no vol excess.  4. Anemia of CKD- Hb 7.7< 8.3 after transfusion 1 unit prbc on 5/5/ Aranesp 100 , 20,000 epo op/ and Venofer on hd/ can dc po iron 5. Secondary HPTH- takes phoslo as binder, CA / Phos stable/ not on vit D 6. Hx CAD / MI 7. DNR 8. Hx COPD   Lenny Pastel, PA-C [facility] Beeper [phone] 11/26/2012,10:22 AM  LOS: 3 days   Patient seen and examined.  I agree with plan as above with additions as indicated. Vinson Moselle  MD [phone] pgr    [phone] cell 11/26/2012, 11:53 AM

## 2012-11-26 NOTE — Progress Notes (Signed)
Received call from pt granddaughter requesting that we ambulate pt in hall today. Pt refusing at this time, has been up to chair, but states has been tired all day. Relayed information to granddaughter who requests that we attempt again before bed. Will pass on information to next shift. Will continue to monitor.

## 2012-11-26 NOTE — Progress Notes (Signed)
Subjective:  No chest pain. + cough and shortness of breath at night.  Objective:  Vital Signs in the last 24 hours: Temp:  [98.1 F (36.7 C)-98.7 F (37.1 C)] 98.4 F (36.9 C) (05/08 0900) Pulse Rate:  [59-69] 67 (05/08 0900) Cardiac Rhythm:  [-]  Resp:  [18-20] 18 (05/08 0900) BP: (127-177)/(63-86) 150/73 mmHg (05/08 0900) SpO2:  [97 %-100 %] 97 % (05/08 0900) Weight:  [78.7 kg (173 lb 8 oz)] 78.7 kg (173 lb 8 oz) (05/07 1953)  Physical Exam: BP Readings from Last 1 Encounters:  11/26/12 150/73     Wt Readings from Last 1 Encounters:  11/25/12 78.7 kg (173 lb 8 oz)    Weight change: -1.799 kg (-3 lb 15.5 oz)  HEENT: [place]/AT, Eyes-Brown, PERL, EOMI, Conjunctiva-Pale, Sclera-Non-icteric Neck: No JVD, No bruit, Trachea midline. Dressing right IJ area. Lungs:  Clear, Bilateral. Cardiac:  Regular rhythm, normal S1 and S2, no S3.  Abdomen:  Soft, non-tender. Extremities:  No edema present. No cyanosis. No clubbing. CNS: AxOx3, Cranial nerves grossly intact, moves all 4 extremities. Right handed. Skin: Warm and dry.   Intake/Output from previous day: 05/07 0701 - 05/08 0700 In: 240 [P.O.:240] Out: 2500     Lab Results: BMET    Component Value Date/Time   NA 136 11/26/2012 0440   K 4.0 11/26/2012 0440   CL 97 11/26/2012 0440   CO2 27 11/26/2012 0440   GLUCOSE 80 11/26/2012 0440   BUN 36* 11/26/2012 0440   CREATININE 5.12* 11/26/2012 0440   CALCIUM 8.8 11/26/2012 0440   GFRNONAA 9* 11/26/2012 0440   GFRAA 11* 11/26/2012 0440   CBC    Component Value Date/Time   WBC 7.1 11/26/2012 0440   RBC 2.75* 11/26/2012 0440   HGB 7.7* 11/26/2012 0440   HCT 21.4* 11/26/2012 0440   PLT 212 11/26/2012 0440   MCV 77.8* 11/26/2012 0440   MCH 28.0 11/26/2012 0440   MCHC 36.0 11/26/2012 0440   RDW 17.1* 11/26/2012 0440   LYMPHSABS 1.3 11/23/2012 0851   MONOABS 0.7 11/23/2012 0851   EOSABS 0.9* 11/23/2012 0851   BASOSABS 0.1 11/23/2012 0851   CARDIAC ENZYMES Lab Results  Component Value Date   CKTOTAL 213 01/20/2012    CKMB 4.9* 01/20/2012   TROPONINI <0.30 11/23/2012    Scheduled Meds: . amLODipine  10 mg Oral q morning - 10a  . calcium acetate (Phos Binder)  667 mg Oral TID WC  . cloNIDine  0.2 mg Oral BID  . darbepoetin (ARANESP) injection - DIALYSIS  100 mcg Intravenous Q Mon-HD  . ferrous sulfate  325 mg Oral Q breakfast  . isosorbide mononitrate  60 mg Oral BID  . lisinopril  20 mg Oral QHS  . LORazepam  0.5 mg Oral QHS  . montelukast  10 mg Oral QHS  . simvastatin  5 mg Oral q1800  . sodium chloride  3 mL Intravenous Q12H   Continuous Infusions:  PRN Meds:.sodium chloride, albuterol, guaiFENesin-dextromethorphan, nitroGLYCERIN, ondansetron (ZOFRAN) IV, ondansetron, sodium chloride, traMADol  Assessment/Plan: Hemoptysis  Shortness of breath  Above due to Lung mass with pleural effusion and hemoptysis-Chronic  Anemia of chronic renal disease and blood loss  Hypertension  CAD  Arthritis  End stage renal disease with hemodialysis.   Continue medical treatment. Overall prognosis is poor.   LOS: 3 days    Derrick Cobb  MD  11/26/2012, 9:16 AM

## 2012-11-27 LAB — CBC
HCT: 23 % — ABNORMAL LOW (ref 39.0–52.0)
HCT: 22.7 % — ABNORMAL LOW (ref 39.0–52.0)
MCH: 27.3 pg (ref 26.0–34.0)
MCH: 27.5 pg (ref 26.0–34.0)
MCHC: 34.3 g/dL (ref 30.0–36.0)
MCHC: 34.8 g/dL (ref 30.0–36.0)
MCV: 79.6 fL (ref 78.0–100.0)
RDW: 16.7 % — ABNORMAL HIGH (ref 11.5–15.5)
RDW: 16.8 % — ABNORMAL HIGH (ref 11.5–15.5)

## 2012-11-27 LAB — RENAL FUNCTION PANEL
Albumin: 2.6 g/dL — ABNORMAL LOW (ref 3.5–5.2)
BUN: 52 mg/dL — ABNORMAL HIGH (ref 6–23)
Calcium: 9.5 mg/dL (ref 8.4–10.5)
Glucose, Bld: 95 mg/dL (ref 70–99)
Phosphorus: 4.4 mg/dL (ref 2.3–4.6)
Potassium: 3.9 mEq/L (ref 3.5–5.1)
Sodium: 134 mEq/L — ABNORMAL LOW (ref 135–145)

## 2012-11-27 MED ORDER — PENTAFLUOROPROP-TETRAFLUOROETH EX AERO: 1.0000 "application " | INHALATION_SPRAY | CUTANEOUS | Status: DC | PRN

## 2012-11-27 MED ORDER — SODIUM CHLORIDE 0.9 % IV SOLN: 100.0000 mL | INTRAVENOUS | Status: DC | PRN

## 2012-11-27 MED ORDER — HEPARIN SODIUM (PORCINE) 1000 UNIT/ML DIALYSIS: 1000.0000 [IU] | INTRAMUSCULAR | Status: DC | PRN

## 2012-11-27 MED ORDER — NEPRO/CARBSTEADY PO LIQD: 237.0000 mL | ORAL | Status: DC | PRN

## 2012-11-27 MED ORDER — HEPARIN SODIUM (PORCINE) 1000 UNIT/ML DIALYSIS: 2000.0000 [IU] | Freq: Once | INTRAMUSCULAR | Status: DC

## 2012-11-27 MED ORDER — LIDOCAINE HCL (PF) 1 % IJ SOLN: 5.0000 mL | INTRAMUSCULAR | Status: DC | PRN

## 2012-11-27 MED ORDER — LIDOCAINE-PRILOCAINE 2.5-2.5 % EX CREA: 1.0000 "application " | TOPICAL_CREAM | CUTANEOUS | Status: DC | PRN

## 2012-11-27 MED ORDER — ALTEPLASE 2 MG IJ SOLR: 2.0000 mg | Freq: Once | INTRAMUSCULAR | Status: DC | PRN

## 2012-11-27 NOTE — ED Provider Notes (Signed)
Medical screening examination/treatment/procedure(s) were conducted as a shared visit with non-physician practitioner(s) and myself.  I personally evaluated the patient during the encounter.  Patient seen and evaluated for increased shortness of breath. This is a dialysis patient, due for dialysis today. He has been having mild dizziness. Patient has a known chest mass which he has declined workup for her. It is felt that hemoptysis is secondary to the mass. Family concerned about increased amount of bleeding. Patient will be admitted for further management.  Gilda Crease, MD 11/27/12 [phone]

## 2012-11-27 NOTE — Progress Notes (Signed)
Utilization review completed.  

## 2012-11-27 NOTE — Progress Notes (Signed)
Physical Therapy Evaluation Patient Details Name: [name] MRN: [id number] DOB: 09/04/1925 Today's Date: 11/27/2012 Time: 7829-5621 PT Time Calculation (min): 25 min  PT Assessment / Plan / Recommendation Clinical Impression  Patient is an 79 yo male admitted with SOB, hemoptysis, anemia, with lung mass.  Patient also with h/o ESRD on HD MWF.  Patient with general weakness and decreased activity tolerance impacting mobility.  Will benefit from acute PT to maximize independence prior to return home.  Recommend HHPT and HH aide for ADL's (wife unable to provide physical assist).    PT Assessment  Patient needs continued PT services    Follow Up Recommendations  Home health PT;Supervision/Assistance - 24 hour [facility] aide)    Does the patient have the potential to tolerate intense rehabilitation      Barriers to Discharge Decreased caregiver support (Wife with limited ability to assist)      Equipment Recommendations  None recommended by PT    Recommendations for Other Services     Frequency Min 3X/week    Precautions / Restrictions Precautions Precautions: Fall Restrictions Weight Bearing Restrictions: No   Pertinent Vitals/Pain       Mobility  Bed Mobility Bed Mobility: Supine to Sit;Sitting - Scoot to Edge of Bed;Sit to Supine Supine to Sit: 4: Min assist;With rails;HOB flat Sitting - Scoot to Edge of Bed: 5: Supervision Sit to Supine: 4: Min guard;HOB flat Details for Bed Mobility Assistance: Verbal cues for technique.  Assist to raise trunk off of bed. Transfers Transfers: Sit to Stand;Stand to Sit Sit to Stand: 4: Min assist;With upper extremity assist;From bed Stand to Sit: 4: Min guard;With upper extremity assist;To bed Details for Transfer Assistance: Verbal cues for hand placement.  Assist to rise to standing from bed. Ambulation/Gait Ambulation/Gait Assistance: 4: Min assist Ambulation Distance (Feet): 74 Feet Assistive device: Rolling  walker Ambulation/Gait Assistance Details: Verbal cues to stand upright and stay close to RW.  Noted patient with scissoring gait.  Verbal cues to keep feet apart during swing phase of gait. Gait Pattern: Step-through pattern;Decreased step length - right;Decreased step length - left;Right foot flat;Scissoring;Trunk flexed Gait velocity: Slow gait speed    Exercises     PT Diagnosis: Difficulty walking;Abnormality of gait;Generalized weakness  PT Problem List: Decreased strength;Decreased range of motion;Decreased activity tolerance;Decreased balance;Decreased mobility;Decreased knowledge of use of DME;Cardiopulmonary status limiting activity PT Treatment Interventions: DME instruction;Gait training;Functional mobility training;Therapeutic exercise;Patient/family education   PT Goals Acute Rehab PT Goals PT Goal Formulation: With patient Time For Goal Achievement: 12/04/12 Potential to Achieve Goals: Good Pt will go Supine/Side to Sit: with supervision;with HOB 0 degrees PT Goal: Supine/Side to Sit - Progress: Goal set today Pt will go Sit to Stand: with supervision;with upper extremity assist PT Goal: Sit to Stand - Progress: Goal set today Pt will Ambulate: 51 - 150 feet;with supervision;with rolling walker PT Goal: Ambulate - Progress: Goal set today  Visit Information  Last PT Received On: 11/27/12 Assistance Needed: +1    Subjective Data  Subjective: We can go walking now Patient Stated Goal: To get home soon   Prior Functioning  Home Living Lives With: Spouse Available Help at Discharge: Family;Available 24 hours/day (Wife not able to provide physical assist) Type of Home: House Home Access: Stairs to enter [employer] of Steps: 1 Entrance Stairs-Rails: Right Home Layout: Two level;Bed/bath upstairs Alternate Level Stairs-Number of Steps: flight (Has chair lift on stairs) Bathroom Shower/Tub: Engineer, manufacturing systems: Standard Bathroom Accessibility:  Yes How Accessible:  Accessible via walker Home Adaptive Equipment: Bedside commode/3-in-1;Tub transfer bench;Walker - rolling;Straight cane Prior Function Level of Independence: Independent with assistive device(s);Needs assistance Needs Assistance: Light Housekeeping;Meal Prep Meal Prep: Maximal Light Housekeeping: Maximal Able to Take Stairs?: No Driving: No Vocation: Retired Musician: No difficulties    Copywriter, advertising Arousal/Alertness: Awake/alert Behavior During Therapy: WFL for tasks assessed/performed Overall Cognitive Status: Within Functional Limits for tasks assessed    Extremity/Trunk Assessment Right Upper Extremity Assessment RUE ROM/Strength/Tone: Deficits RUE ROM/Strength/Tone Deficits: Decreased shoulder ROM; Strength 3/5 Left Upper Extremity Assessment LUE ROM/Strength/Tone: Deficits LUE ROM/Strength/Tone Deficits: Decreased shoulder ROM; Strength 3/5 Right Lower Extremity Assessment RLE ROM/Strength/Tone: Deficits RLE ROM/Strength/Tone Deficits: Strength 4-/5 Left Lower Extremity Assessment LLE ROM/Strength/Tone: Deficits LLE ROM/Strength/Tone Deficits: Strength 4-/5   Balance    End of Session PT - End of Session Equipment Utilized During Treatment: Gait belt Activity Tolerance: Patient limited by fatigue Patient left: in bed;with call bell/phone within reach Nurse Communication: Mobility status  GP     Vena Austria 11/27/2012, 6:49 PM Durenda Hurt. Renaldo Fiddler, [facility] Acute Rehab Services Pager [phone]

## 2012-11-27 NOTE — Progress Notes (Signed)
Subjective:  Chronic hymoptysis but stable Hgb. Patient needs PT evaluation and treatment prior to discharge. T max 99.1 F.  Objective:  Vital Signs in the last 24 hours: Temp:  [97.3 F (36.3 C)-99.1 F (37.3 C)] 99.1 F (37.3 C) (05/09 1228) Pulse Rate:  [58-80] 80 (05/09 1228) Cardiac Rhythm:  [-]  Resp:  [18] 18 (05/09 1228) BP: (139-186)/(75-95) 184/77 mmHg (05/09 1228) SpO2:  [98 %-100 %] 100 % (05/09 1228) Weight:  [77.1 kg (169 lb 15.6 oz)-80.1 kg (176 lb 9.4 oz)] 77.1 kg (169 lb 15.6 oz) (05/09 1120)  Physical Exam: BP Readings from Last 1 Encounters:  11/27/12 184/77     Wt Readings from Last 1 Encounters:  11/27/12 77.1 kg (169 lb 15.6 oz)    Weight change:   HEENT: [place]/AT, Eyes-Brown, PERL, EOMI, Conjunctiva-Pale, Sclera-Non-icteric Neck: No JVD, No bruit, Trachea midline. Lungs:  Clear, Bilateral. Cardiac:  Regular rhythm, normal S1 and S2, no S3.  Abdomen:  Soft, non-tender. Extremities:  No edema present. No cyanosis. No clubbing. Left arm AV fistula. CNS: AxOx3, Cranial nerves grossly intact, moves all 4 extremities. Right handed. Skin: Warm and dry.   Intake/Output from previous day: 05/08 0701 - 05/09 0700 In: 1200 [P.O.:1200] Out: -     Lab Results: BMET    Component Value Date/Time   NA 134* 11/27/2012 0740   K 3.9 11/27/2012 0740   CL 95* 11/27/2012 0740   CO2 26 11/27/2012 0740   GLUCOSE 95 11/27/2012 0740   BUN 52* 11/27/2012 0740   CREATININE 7.41* 11/27/2012 0740   CALCIUM 9.5 11/27/2012 0740   GFRNONAA 6* 11/27/2012 0740   GFRAA 7* 11/27/2012 0740   CBC    Component Value Date/Time   WBC 8.5 11/27/2012 0740   RBC 2.87* 11/27/2012 0740   HGB 7.9* 11/27/2012 0740   HCT 22.7* 11/27/2012 0740   PLT 210 11/27/2012 0740   MCV 79.1 11/27/2012 0740   MCH 27.5 11/27/2012 0740   MCHC 34.8 11/27/2012 0740   RDW 16.8* 11/27/2012 0740   LYMPHSABS 1.3 11/23/2012 0851   MONOABS 0.7 11/23/2012 0851   EOSABS 0.9* 11/23/2012 0851   BASOSABS 0.1 11/23/2012 0851   CARDIAC ENZYMES Lab  Results  Component Value Date   CKTOTAL 213 01/20/2012   CKMB 4.9* 01/20/2012   TROPONINI <0.30 11/23/2012    Scheduled Meds: . amLODipine  10 mg Oral q morning - 10a  . calcium acetate (Phos Binder)  667 mg Oral TID WC  . cloNIDine  0.2 mg Oral BID  . darbepoetin (ARANESP) injection - DIALYSIS  100 mcg Intravenous Q Mon-HD  . ferrous sulfate  325 mg Oral Q breakfast  . isosorbide mononitrate  60 mg Oral BID  . lisinopril  20 mg Oral QHS  . LORazepam  0.5 mg Oral QHS  . montelukast  10 mg Oral QHS  . simvastatin  5 mg Oral q1800  . sodium chloride  3 mL Intravenous Q12H   Continuous Infusions:  PRN Meds:.sodium chloride, albuterol, guaiFENesin-dextromethorphan, nitroGLYCERIN, ondansetron (ZOFRAN) IV, ondansetron, sodium chloride, traMADol  Assessment/Plan: Hemoptysis- now chronic  Shortness of breath  Above due to Lung mass-chronic  Anemia of chronic renal disease and blood loss  Hypertension  CAD  Arthritis  End stage renal disease with hemodialysis.   PT evaluation and treatment. Home health nurse and aid as wife is not able to provide medical and daily living activity care.   LOS: 4 days    Orpah Cobb  MD  11/27/2012, 4:52 PM

## 2012-11-27 NOTE — Procedures (Signed)
I was present at this dialysis session. I have reviewed the session itself and made appropriate changes.   Vinson Moselle, MD [employer] 11/27/2012, 10:15 AM

## 2012-11-27 NOTE — Progress Notes (Signed)
Subjective: Still coughing up blood from time to time. No other complaints   Objective Vital signs in last 24 hours: Filed Vitals:   11/27/12 0802 11/27/12 0830 11/27/12 0900 11/27/12 0930  BP: 174/95 158/82 174/81 186/82  Pulse: 64 59 59 62  Temp:      TempSrc:      Resp: 18     Height:      Weight:      SpO2:       Weight change:   Intake/Output Summary (Last 24 hours) at 11/27/12 1012 Last data filed at 11/26/12 2038  Gross per 24 hour  Intake    960 ml  Output      0 ml  Net    960 ml   Labs: Basic Metabolic Panel:  Recent Labs Lab 11/25/12 0500 11/26/12 0440 11/27/12 0740  NA 133* 136 134*  K 4.3 4.0 3.9  CL 94* 97 95*  CO2 27 27 26   GLUCOSE 87 80 95  BUN 57* 36* 52*  CREATININE 8.14* 5.12* 7.41*  CALCIUM 9.1 8.8 9.5  PHOS 5.2* 4.0 4.4   Liver Function Tests:  Recent Labs Lab 11/23/12 0851 11/25/12 0500 11/26/12 0440 11/27/12 0740  AST 24  --   --   --   ALT 17  --   --   --   ALKPHOS 88  --   --   --   BILITOT 0.3  --   --   --   PROT 8.9*  --   --   --   ALBUMIN 3.3* 2.7* 2.6* 2.6*   No results found for this basename: LIPASE, AMYLASE,  in the last 168 hours No results found for this basename: AMMONIA,  in the last 168 hours CBC:  Recent Labs Lab 11/23/12 0851  11/24/12 0852 11/25/12 0500 11/26/12 0440 11/27/12 0630 11/27/12 0740  WBC 10.0  < > 9.0 7.0 7.1 8.1 8.5  NEUTROABS 7.1  --   --   --   --   --   --   HGB 7.9*  < > 8.3* 7.6* 7.7* 7.9* 7.9*  HCT 21.9*  < > 23.2* 21.2* 21.4* 23.0* 22.7*  MCV 78.2  < > 76.8* 77.1* 77.8* 79.6 79.1  PLT 269  < > 236 222 212 223 210  < > = values in this interval not displayed. Cardiac Enzymes:  Recent Labs Lab 11/23/12 0851  TROPONINI <0.30   CBG: No results found for this basename: GLUCAP,  in the last 168 hours  Iron Studies: No results found for this basename: IRON, TIBC, TRANSFERRIN, FERRITIN,  in the last 72 hours Studies/Results: No results found. Medications:   . amLODipine   10 mg Oral q morning - 10a  . calcium acetate (Phos Binder)  667 mg Oral TID WC  . cloNIDine  0.2 mg Oral BID  . darbepoetin (ARANESP) injection - DIALYSIS  100 mcg Intravenous Q Mon-HD  . ferrous sulfate  325 mg Oral Q breakfast  . isosorbide mononitrate  60 mg Oral BID  . lisinopril  20 mg Oral QHS  . LORazepam  0.5 mg Oral QHS  . montelukast  10 mg Oral QHS  . simvastatin  5 mg Oral q1800  . sodium chloride  3 mL Intravenous Q12H   I  have reviewed scheduled and prn medications.  Physical Exam: General: Alert thin BM ,NAD Appropriate  Chest: clear on R,  Faint wheeze on left CV: regular, no rub  Abdomen: soft, nondistended,  no ascites or HSM  Ext: no LE edema,   Access: R IJ HD cath removed yesterday, old accesses in LUA   Outpatient HD: MWF [facility] F180 4 hrs 80kg IJ cath, Profile 4/VNa 148 2K/2.25Ca EPO 20K Hep bolus was 8000 prior to admission, see changes below Venofer 100 q Rx   Impression/Plan  1. Hemoptysis- pt with known lung mass, presumably cancer.No wu planned per pt. Wishes/ Plavix and ASA have been stopped. Heparin with dialysis on hold as well.  2. ESRD, cont HD MWF( [facility]). Using AVG, permcath removed 3. HTN/volume- stable, no vol excess.  4. Anemia of CKD- Hb 7.7< 8.3 after transfusion 1 unit prbc on 5/5/ Aranesp 100 , 20,000 epo op/ and Venofer on hd/ can dc po iron 5. Secondary HPTH- takes phoslo as binder, CA / Phos stable/ not on vit D 6. Hx CAD / MI 7. DNR 8. Hx COPD 9. Dispo- per primary  Vinson Moselle  MD [phone] pgr    [phone] cell 11/27/2012, 10:14 AM

## 2012-11-28 LAB — RENAL FUNCTION PANEL
Calcium: 8.9 mg/dL (ref 8.4–10.5)
Creatinine, Ser: 6.31 mg/dL — ABNORMAL HIGH (ref 0.50–1.35)
GFR calc Af Amer: 8 mL/min — ABNORMAL LOW (ref >90)
GFR calc non Af Amer: 7 mL/min — ABNORMAL LOW (ref >90)
Phosphorus: 3.6 mg/dL (ref 2.3–4.6)
Sodium: 136 mEq/L (ref 135–145)

## 2012-11-28 LAB — CBC
MCH: 27.6 pg (ref 26.0–34.0)
MCHC: 34.4 g/dL (ref 30.0–36.0)
MCV: 80.3 fL (ref 78.0–100.0)
Platelets: 212 10*3/uL (ref 150–400)

## 2012-11-28 NOTE — Progress Notes (Signed)
Subjective:  No current complaints, still with productive cough, but improving, anticipating discharge today.  Objective: Vital signs in last 24 hours: Temp:  [97.6 F (36.4 C)-99.1 F (37.3 C)] 97.6 F (36.4 C) (05/10 0528) Pulse Rate:  [58-80] 68 (05/10 0528) Resp:  [18-20] 18 (05/10 0528) BP: (131-186)/(67-90) 131/69 mmHg (05/10 0528) SpO2:  [100 %] 100 % (05/10 0528) Weight:  [77.1 kg (169 lb 15.6 oz)-79.3 kg (174 lb 13.2 oz)] 79.3 kg (174 lb 13.2 oz) (05/09 2149) Weight change:   Intake/Output from previous day: 05/09 0701 - 05/10 0700 In: 240 [P.O.:240] Out: 2360    EXAM: General appearance:  Alert, in no apparent distress Resp:  CTA without rales, rhonchi, or wheezes  Cardio:  RRR without murmur or rub GI:  + BS, soft and nontender Extremities:  No edema Access:  AVG @ LFA with + bruit  Lab Results:  Recent Labs  11/27/12 0740 11/28/12 0759  WBC 8.5 8.4  HGB 7.9* 7.7*  HCT 22.7* 22.4*  PLT 210 212   BMET:  Recent Labs  11/26/12 0440 11/27/12 0740  NA 136 134*  K 4.0 3.9  CL 97 95*  CO2 27 26  GLUCOSE 80 95  BUN 36* 52*  CREATININE 5.12* 7.41*  CALCIUM 8.8 9.5  ALBUMIN 2.6* 2.6*   No results found for this basename: PTH,  in the last 72 hours Iron Studies: No results found for this basename: IRON, TIBC, TRANSFERRIN, FERRITIN,  in the last 72 hours  Outpatient HD: [facility] F180 4 hrs 80kg IJ cath, Profile 4/VNa 148 2K/2.25Ca EPO 20K Hep bolus was 8000 prior to admission, see changes below Venofer 100 q Rx   Assessment/Plan: 1. Hemoptysis - Chest x-ray 5/8 showed unchanged LLL mass, no workup planned; Plavix and ASA stopped, Heparin on hold. 2. ESRD - HD on MWF @ [facility]; K 3.9 pre-HD yesterday.  Next HD 5/12. 3. HTN/Volume - BP 131/69 on Amlodipine, Clonidine, Lisinopril; current wt 79.3 kg s/p net UF 2.4 L yesterday. 4. Anemia - Hgb 7.7on Aranesp 100 mcg pn Tues, IV Fe per HD. 5. Secondary hyperparathyroidism - Ca 9.5 (10.6 corrected), P 4.4;  Phoslyra with meals. 6. Hx CAD/MI 7. Hx COPD   LOS: 5 days   [name],CHARLES 11/28/2012,8:50 AM  Patient seen and examined.  Agree with assessment and plan as above. Vinson Moselle  MD [phone] pgr    [phone] cell 11/28/2012, 11:35 AM

## 2012-11-28 NOTE — Discharge Summary (Signed)
Physician Discharge Summary  Patient ID: [name] MRN: [id number] DOB/AGE: 1926-06-15 79 y.o.  Admit date: 11/23/2012 Discharge date: 11/28/2012  Admission Diagnoses: Hemoptysis  Shortness of breath  Above due to Lung mass-chronic  Anemia of chronic renal disease and blood loss  Hypertension  CAD  Arthritis  End stage renal disease with hemodialysis  Discharge Diagnoses:  Principle Problem: * Hemoptysis and Shortness of breath*  Above due to Lung mass-chronic  Anemia of chronic renal disease and blood loss  Hypertension  CAD  Arthritis  End stage renal disease with hemodialysis  Discharged Condition: good  Hospital Course: 79 years old male with known lung mass, treated medically only per patient and family request has increasing cough and hemoptysis. His aspirin and plavix were discontinued with significant decrease in hemoptysis over next 4 days. He will have home health aide and has family support to continue care at home per wife's request. His overall prognosis remains poor.   Consults: nephrology  Significant Diagnostic Studies: labs: near normal electrolytes, elevated BUN creatinine improving with hemodialysis. Hgb down to 7.9 g/dl post 1 unit blood transfusion.   Treatments: IV hydration and dialysis: Hemodialysis  Discharge Exam: Blood pressure 131/69, pulse 68, temperature 97.6 F (36.4 C), temperature source Oral, resp. rate 18, height 6\' 2"  (1.88 m), weight 79.3 kg (174 lb 13.2 oz), SpO2 100.00%. HEENT: [place]/AT, Eyes-Brown, PERL, EOMI, Conjunctiva-Pale, Sclera-Non-icteric  Neck: No JVD, No bruit, Trachea midline. Dressing right IJ area.  Lungs: Clear, Bilateral.  Cardiac: Regular rhythm, normal S1 and S2, no S3.  Abdomen: Soft, non-tender.  Extremities: No edema present. No cyanosis. No clubbing.  CNS: AxOx3, Cranial nerves grossly intact, moves all 4 extremities. Right handed.  Skin: Warm and dry.  Disposition: 01-Home or Self Care     Medication List     STOP taking these medications       aspirin EC 81 MG tablet     clopidogrel 75 MG tablet  Commonly known as:  PLAVIX     furosemide 40 MG tablet  Commonly known as:  LASIX     NIFEDICAL XL 60 MG 24 hr tablet  Generic drug:  NIFEdipine      TAKE these medications       albuterol 108 (90 BASE) MCG/ACT inhaler  Commonly known as:  PROVENTIL HFA;VENTOLIN HFA  Inhale 1-2 puffs into the lungs every 6 (six) hours as needed. For shortness of breath     albuterol (2.5 MG/3ML) 0.083% nebulizer solution  Commonly known as:  PROVENTIL  Take 2.5 mg by nebulization 2 (two) times daily as needed for wheezing.     AMBULATORY NON FORMULARY MEDICATION  Apply 1 application topically as needed. Medication Name: [place] Made Joint Support     amLODipine 10 MG tablet  Commonly known as:  NORVASC  Take 10 mg by mouth every morning.     calcium acetate (Phos Binder) 667 MG/5ML Soln  Commonly known as:  PHOSLYRA  Take 667 mg by mouth 3 (three) times daily with meals.     cloNIDine 0.2 MG tablet  Commonly known as:  CATAPRES  Take 0.2 mg by mouth 2 (two) times daily.     [employer] IRON 325 (65 FE) MG tablet  Generic drug:  ferrous sulfate  Take 325 mg by mouth daily with breakfast.     feeding supplement (NEPRO CARB STEADY) Liqd  Take 237 mLs by mouth daily.     isosorbide mononitrate 60 MG 24 hr tablet  Commonly known  as:  IMDUR  Take 60 mg by mouth 2 (two) times daily.     lisinopril 20 MG tablet  Commonly known as:  PRINIVIL,ZESTRIL  Take 20 mg by mouth at bedtime.     LORazepam 0.5 MG tablet  Commonly known as:  ATIVAN  Take 0.5 mg by mouth at bedtime. For anxiety.     lovastatin 20 MG tablet  Commonly known as:  MEVACOR  Take 20 mg by mouth at bedtime.     montelukast 10 MG tablet  Commonly known as:  SINGULAIR  Take 10 mg by mouth at bedtime.     multivitamin Tabs tablet  Take 1 tablet by mouth at bedtime.     nitroGLYCERIN 0.4 MG SL tablet  Commonly known as:   NITROSTAT  Place 0.4 mg under the tongue every 5 (five) minutes as needed. For chest pain     traMADol 50 MG tablet  Commonly known as:  ULTRAM  Take 50 mg by mouth 2 (two) times daily as needed. For pain. Maximum dose= 8 tablets per day     Whey Protein Powd  Take 1 application by mouth daily.           Follow-up Information   Follow up with [facility], MD. Schedule an appointment as soon as possible for a visit in 2 weeks.   Contact information:   [address] [place] [address] [phone]       Signed: Ricki Rodriguez 11/28/2012, 8:36 AM

## 2012-11-28 NOTE — Progress Notes (Signed)
Discussed discharge instructions and medications with pt. Pt showed no barriers to discharge. IV removed. Pt awaiting ride home with son. Assessment unchanged from morning.

## 2012-11-29 NOTE — Progress Notes (Signed)
11/29/12 5:01pm CM consult for Home health Nursing and Aide. Patient previously with [facility]. Faxed referral to [phone]. Called patient to advise and mailbox was full. Magnus Ivan, RN, BSN, [place]  [phone].

## 2012-12-14 ENCOUNTER — Telehealth: Payer: Self-pay | Admitting: Internal Medicine

## 2012-12-14 NOTE — Telephone Encounter (Signed)
[name] 12/04/12 is normal. Recent dc 5/11?14 shows they are in palliative mode. Check if they are interested in followup  Dr. Kalman Shan, M.D., [facility].C.P Pulmonary and Critical Care Medicine Staff Physician [facility] [place] [facility] Pager: [phone], If no answer or between  15:00h - 7:00h: call [phone]  12/14/2012 11:44 AM

## 2012-12-24 NOTE — Telephone Encounter (Signed)
I spoke with the pt spouse and she states that they are not doing any pallative care. She states the pt is doing well now and is not having any breathing issues at this time either. She states they will follow-up later in the summer with PFT. Carron Curie, CMA

## 2013-01-01 ENCOUNTER — Encounter: Payer: Self-pay | Admitting: Internal Medicine

## 2013-01-18 IMAGING — CR DG CHEST 1V PORT
1 series · 1 of 1 positions shown · non-contrast
Comparison: 07/07/2012

CLINICAL DATA: Post central line placement.

PORTABLE CHEST - 1 VIEW

[AP]
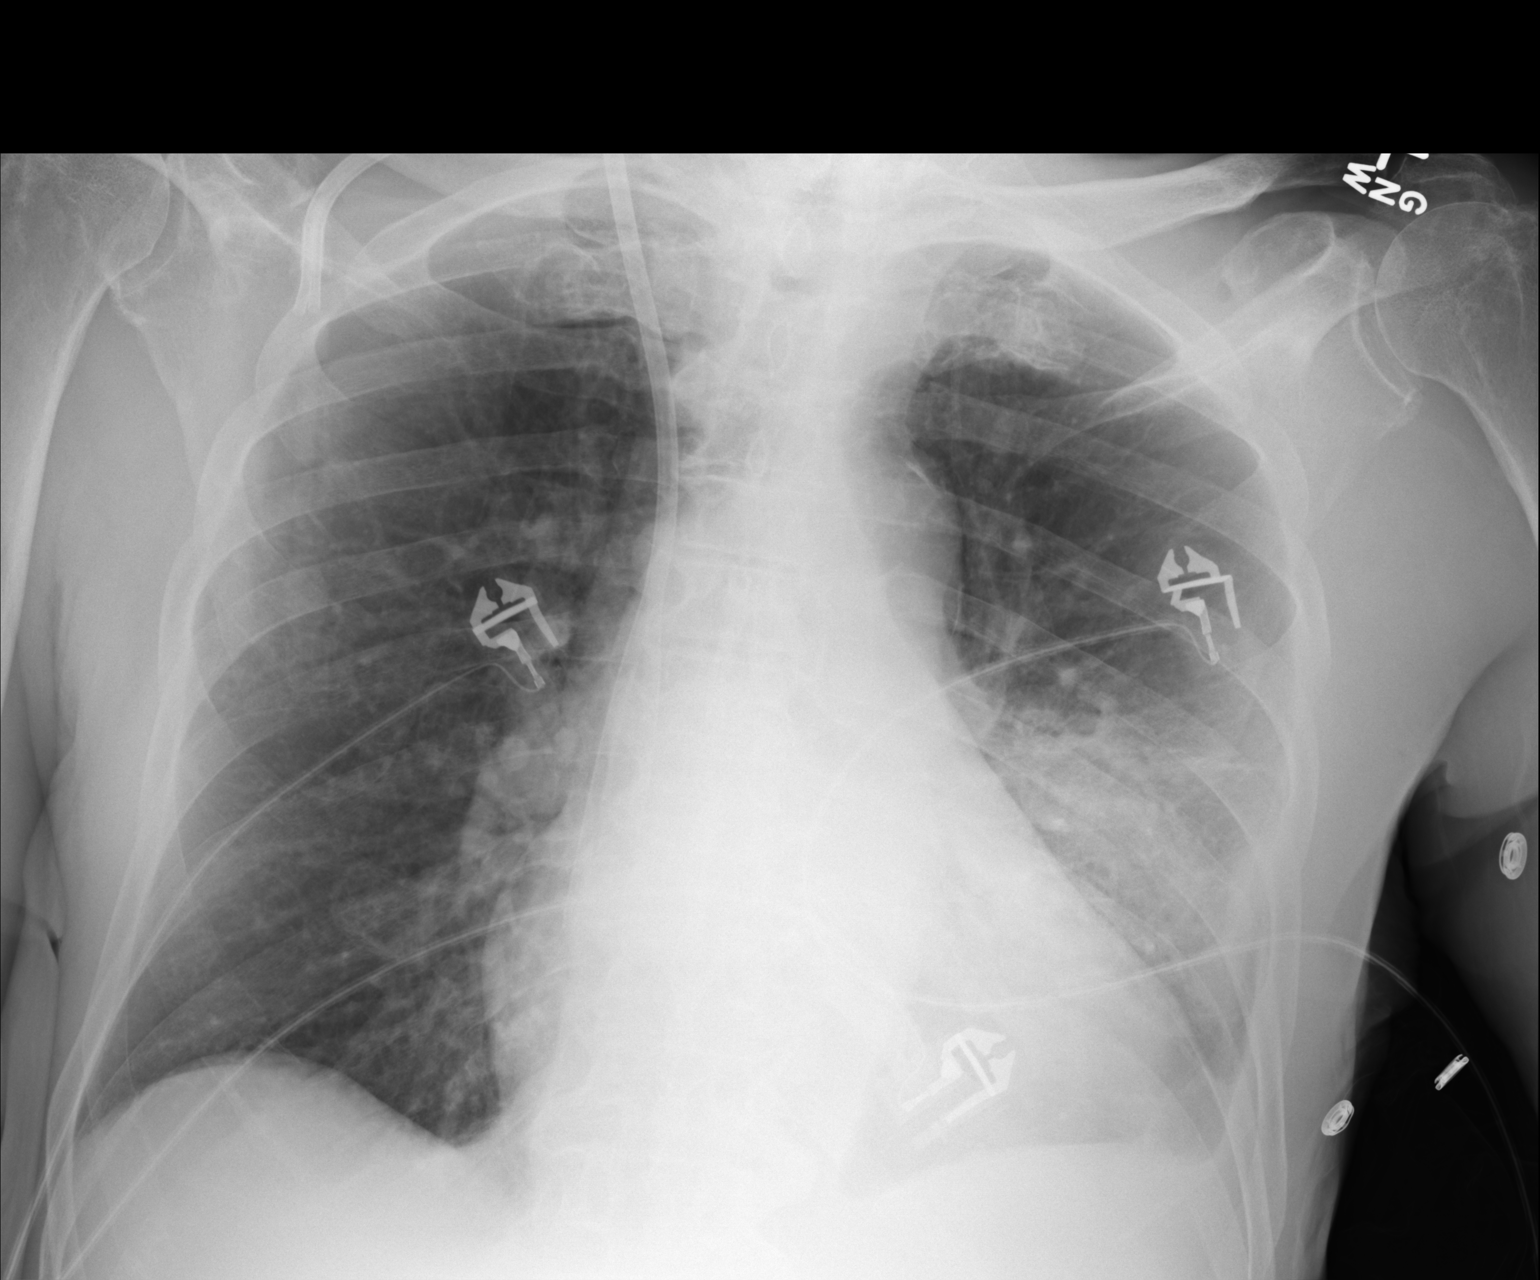

[1 of 1 positions shown; findings below may reference images not displayed]

FINDINGS: Right dialysis catheter has been placed with the tip in
the upper right atrium.  No pneumothorax.  Mild cardiomegaly.  Area
of mass/consolidation in the left mid and lower lobe with small
left pleural effusion, stable.  Right lung is clear.
IMPRESSION: Right dialysis catheter placement with tip in the upper right
atrium.  No pneumothorax.

Continued mass-like consolidation in the left lower lobe with small
left effusion, stable.

## 2013-03-04 ENCOUNTER — Other Ambulatory Visit: Payer: Self-pay | Admitting: Cardiovascular Disease

## 2013-03-04 ENCOUNTER — Ambulatory Visit
Admission: RE | Admit: 2013-03-04 | Discharge: 2013-03-04 | Disposition: A | Discharge status: Home / Self Care | Payer: Medicare Other | Source: Ambulatory Visit | Attending: Cardiovascular Disease | Admitting: Cardiovascular Disease

## 2013-03-04 DIAGNOSIS — R52 Pain, unspecified: Secondary | ICD-10-CM

## 2013-05-23 IMAGING — CR DG CHEST 2V
2 series · 2 of 2 positions shown · non-contrast
Comparison: 07/07/2012 and CT chest 01/20/2012.

CLINICAL DATA: Cough and coarse rhonchi.

CHEST - 2 VIEW

[w chest lat *]
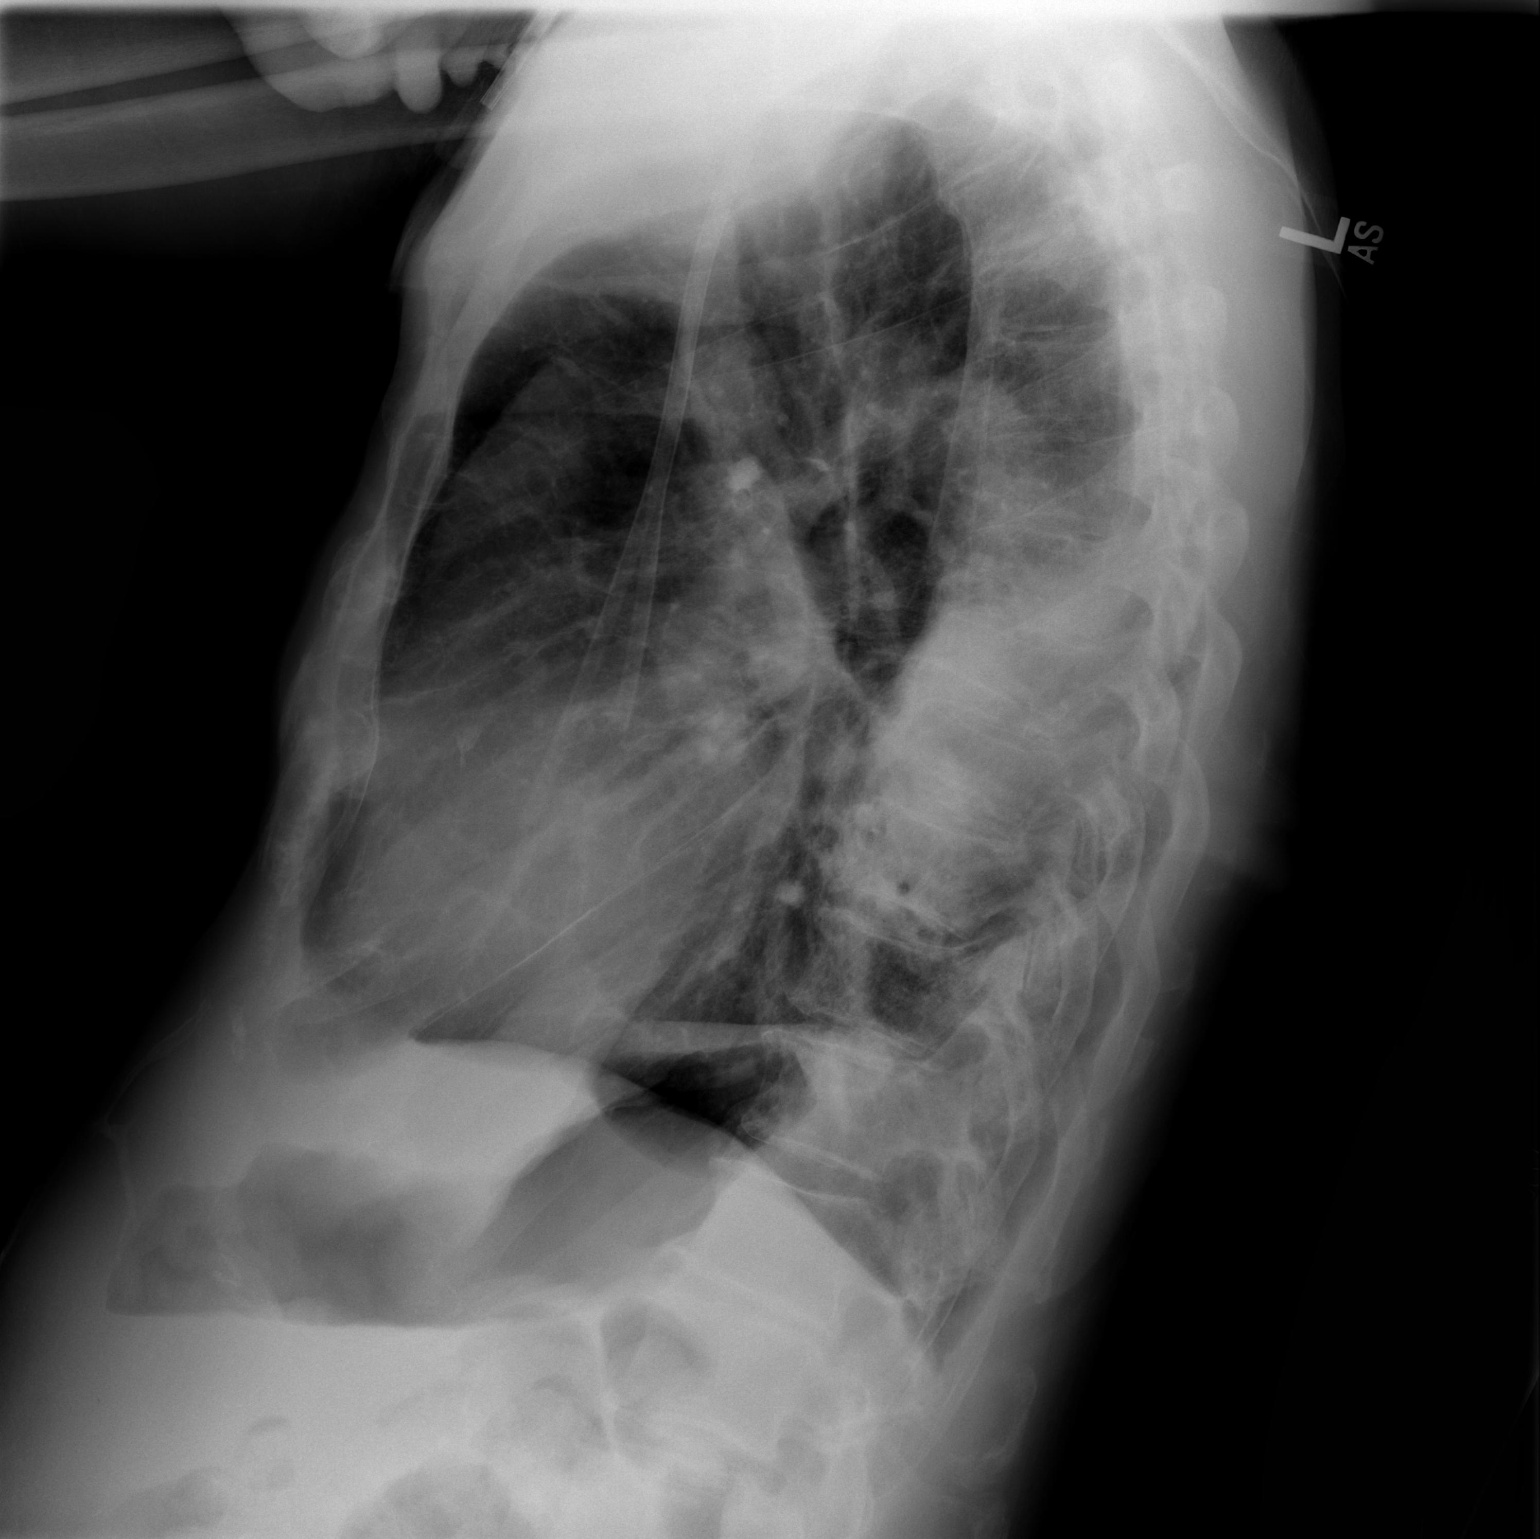

[w chest ap *]
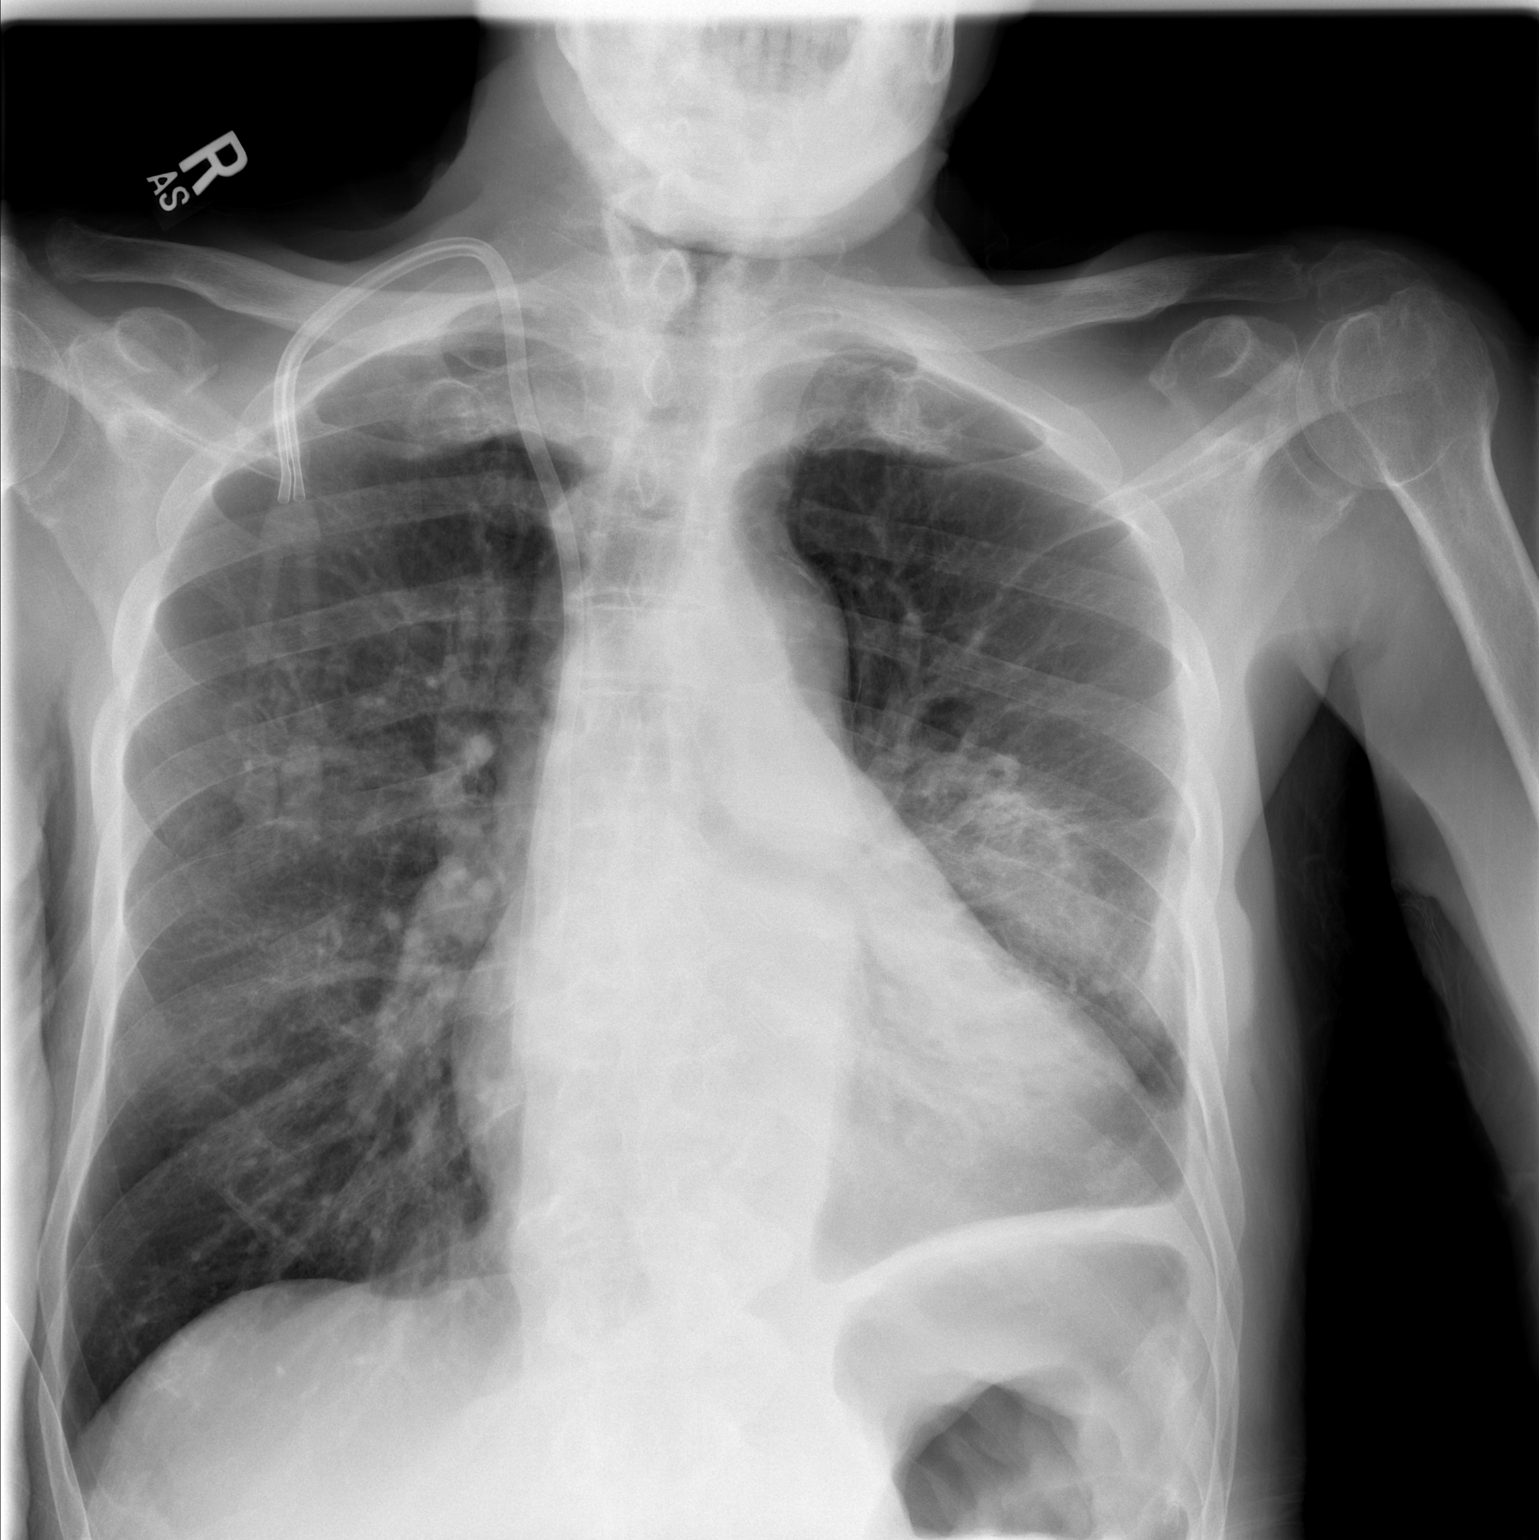

[2 of 2 positions shown; findings below may reference images not displayed]

FINDINGS: Trachea is midline.  Right IJ dialysis catheter tips
project over the SVC and SVC/RA junction.  Heart is enlarged,
stable.  Mass-like area of consolidation in the left perihilar
region with pleural thickening at the base of the left hemithorax,
likely unchanged from prior examination.  Right lung is clear.
IMPRESSION: Mass-like area of consolidation in the left perihilar region with
left basilar pleural thickening, grossly unchanged.

## 2013-06-06 IMAGING — CR DG CHEST 1V PORT
1 series · 1 of 1 positions shown · non-contrast
Comparison: November 09, 2012.

CLINICAL DATA: Shortness of breath

PORTABLE CHEST - 1 VIEW

[AP]
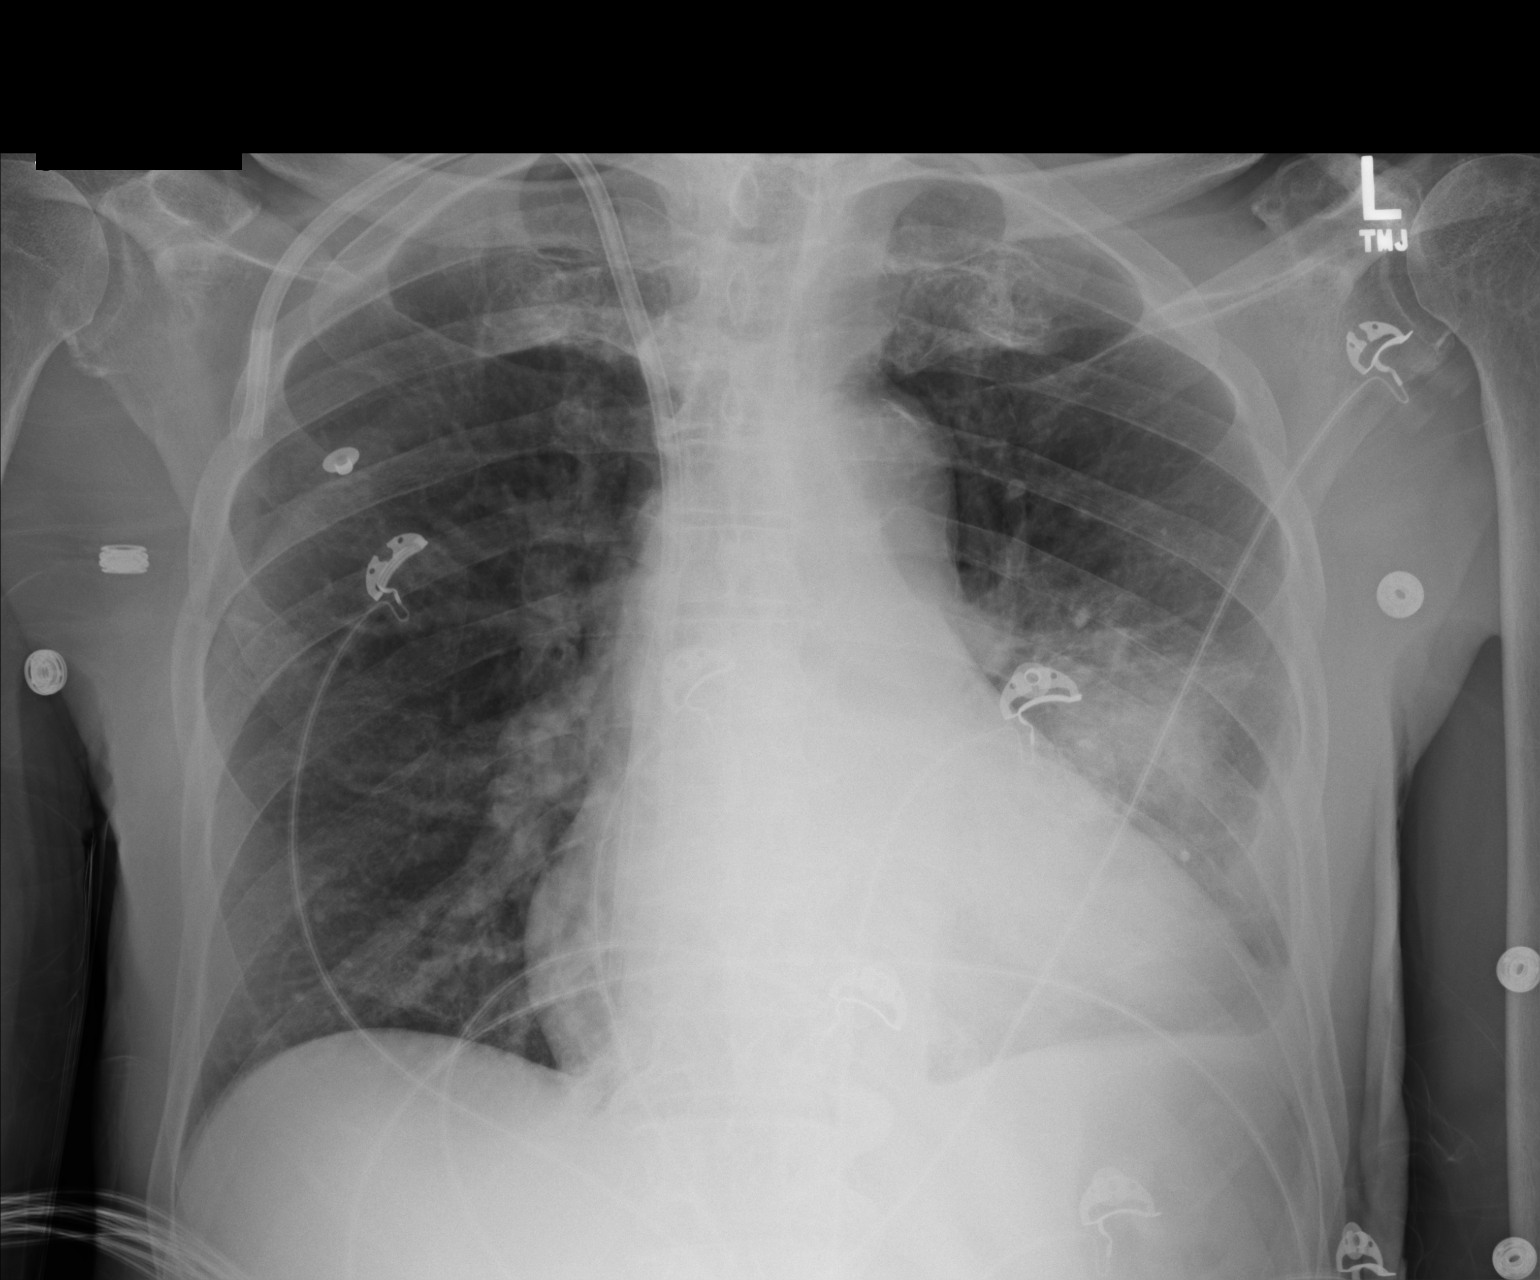

[1 of 1 positions shown; findings below may reference images not displayed]

FINDINGS: Stable mild cardiomegaly.  No change is noted in position
of the right internal jugular dialysis catheter.  Right lung is
clear.  Blunting of left costophrenic sulcus is noted which is
unchanged most consistent with scarring.  No change is seen
involving the left lower lobe mass compared to prior exam.
IMPRESSION: Overall, no significant change compared to prior exam.  There is
continued presence of left lower lobe mass as described on prior
studies which is unchanged.

## 2013-06-10 ENCOUNTER — Ambulatory Visit
Admission: RE | Admit: 2013-06-10 | Discharge: 2013-06-10 | Disposition: A | Discharge status: Home / Self Care | Payer: Medicare Other | Source: Ambulatory Visit | Attending: Cardiovascular Disease | Admitting: Cardiovascular Disease

## 2013-06-10 ENCOUNTER — Other Ambulatory Visit: Payer: Self-pay | Admitting: Cardiovascular Disease

## 2013-06-10 DIAGNOSIS — R05 Cough: Secondary | ICD-10-CM

## 2013-06-24 ENCOUNTER — Other Ambulatory Visit (HOSPITAL_COMMUNITY): Payer: Self-pay | Admitting: Nephrology

## 2013-06-24 DIAGNOSIS — N186 End stage renal disease: Secondary | ICD-10-CM

## 2013-06-24 DIAGNOSIS — T829XXA Unspecified complication of cardiac and vascular prosthetic device, implant and graft, initial encounter: Secondary | ICD-10-CM

## 2013-06-25 ENCOUNTER — Encounter (HOSPITAL_COMMUNITY): Payer: Self-pay | Admitting: Emergency Medicine

## 2013-06-25 ENCOUNTER — Emergency Department (HOSPITAL_COMMUNITY)
Admission: EM | Admit: 2013-06-25 | Discharge: 2013-06-25 | Disposition: A | Discharge status: Home / Self Care | Payer: Medicare Other | Attending: Emergency Medicine | Admitting: Emergency Medicine

## 2013-06-25 DIAGNOSIS — F411 Generalized anxiety disorder: Secondary | ICD-10-CM | POA: Insufficient documentation

## 2013-06-25 DIAGNOSIS — Z9861 Coronary angioplasty status: Secondary | ICD-10-CM | POA: Insufficient documentation

## 2013-06-25 DIAGNOSIS — Z8739 Personal history of other diseases of the musculoskeletal system and connective tissue: Secondary | ICD-10-CM | POA: Insufficient documentation

## 2013-06-25 DIAGNOSIS — Z85118 Personal history of other malignant neoplasm of bronchus and lung: Secondary | ICD-10-CM | POA: Insufficient documentation

## 2013-06-25 DIAGNOSIS — R4182 Altered mental status, unspecified: Secondary | ICD-10-CM | POA: Insufficient documentation

## 2013-06-25 DIAGNOSIS — N186 End stage renal disease: Secondary | ICD-10-CM | POA: Insufficient documentation

## 2013-06-25 DIAGNOSIS — Z87891 Personal history of nicotine dependence: Secondary | ICD-10-CM | POA: Insufficient documentation

## 2013-06-25 DIAGNOSIS — M25569 Pain in unspecified knee: Secondary | ICD-10-CM | POA: Insufficient documentation

## 2013-06-25 DIAGNOSIS — IMO0001 Reserved for inherently not codable concepts without codable children: Secondary | ICD-10-CM | POA: Insufficient documentation

## 2013-06-25 DIAGNOSIS — Z79899 Other long term (current) drug therapy: Secondary | ICD-10-CM | POA: Insufficient documentation

## 2013-06-25 DIAGNOSIS — Z992 Dependence on renal dialysis: Secondary | ICD-10-CM | POA: Insufficient documentation

## 2013-06-25 DIAGNOSIS — I2510 Atherosclerotic heart disease of native coronary artery without angina pectoris: Secondary | ICD-10-CM | POA: Insufficient documentation

## 2013-06-25 DIAGNOSIS — Z862 Personal history of diseases of the blood and blood-forming organs and certain disorders involving the immune mechanism: Secondary | ICD-10-CM | POA: Insufficient documentation

## 2013-06-25 DIAGNOSIS — I120 Hypertensive chronic kidney disease with stage 5 chronic kidney disease or end stage renal disease: Secondary | ICD-10-CM | POA: Insufficient documentation

## 2013-06-25 DIAGNOSIS — E785 Hyperlipidemia, unspecified: Secondary | ICD-10-CM | POA: Insufficient documentation

## 2013-06-25 DIAGNOSIS — G8929 Other chronic pain: Secondary | ICD-10-CM

## 2013-06-25 DIAGNOSIS — Z8701 Personal history of pneumonia (recurrent): Secondary | ICD-10-CM | POA: Insufficient documentation

## 2013-06-25 LAB — CBC WITH DIFFERENTIAL/PLATELET
Basophils Absolute: 0 10*3/uL (ref 0.0–0.1)
Basophils Relative: 0 % (ref 0–1)
Eosinophils Absolute: 1 10*3/uL — ABNORMAL HIGH (ref 0.0–0.7)
Eosinophils Relative: 10 % — ABNORMAL HIGH (ref 0–5)
HCT: 21.2 % — ABNORMAL LOW (ref 39.0–52.0)
Hemoglobin: 7 g/dL — ABNORMAL LOW (ref 13.0–17.0)
Lymphocytes Relative: 10 % — ABNORMAL LOW (ref 12–46)
Lymphs Abs: 1 10*3/uL (ref 0.7–4.0)
MCH: 26.2 pg (ref 26.0–34.0)
MCHC: 33 g/dL (ref 30.0–36.0)
MCV: 79.4 fL (ref 78.0–100.0)
Monocytes Absolute: 1.3 10*3/uL — ABNORMAL HIGH (ref 0.1–1.0)
Monocytes Relative: 14 % — ABNORMAL HIGH (ref 3–12)
Neutro Abs: 6.4 10*3/uL (ref 1.7–7.7)
Neutrophils Relative %: 66 % (ref 43–77)
Platelets: 239 10*3/uL (ref 150–400)
RBC: 2.67 MIL/uL — ABNORMAL LOW (ref 4.22–5.81)
RDW: 17 % — ABNORMAL HIGH (ref 11.5–15.5)
WBC: 9.7 10*3/uL (ref 4.0–10.5)

## 2013-06-25 LAB — BASIC METABOLIC PANEL
BUN: 35 mg/dL — ABNORMAL HIGH (ref 6–23)
CO2: 29 mEq/L (ref 19–32)
Calcium: 8.4 mg/dL (ref 8.4–10.5)
Chloride: 96 mEq/L (ref 96–112)
Creatinine, Ser: 5.52 mg/dL — ABNORMAL HIGH (ref 0.50–1.35)
GFR calc Af Amer: 10 mL/min — ABNORMAL LOW (ref >90)
GFR calc non Af Amer: 8 mL/min — ABNORMAL LOW (ref >90)
Glucose, Bld: 77 mg/dL (ref 70–99)
Potassium: 4 mEq/L (ref 3.5–5.1)
Sodium: 136 mEq/L (ref 135–145)

## 2013-06-25 LAB — GLUCOSE, CAPILLARY: Glucose-Capillary: 86 mg/dL (ref 70–99)

## 2013-06-25 NOTE — ED Notes (Signed)
Pt taken to waiting room by EMT; son coming to help pt and wife get into car since walker was not transported from dialysis to hospital by EMS.

## 2013-06-25 NOTE — ED Notes (Signed)
Food and drink given.

## 2013-06-25 NOTE — ED Provider Notes (Signed)
CSN: [id number]     Arrival date & time 06/25/13  1506 History   First MD Initiated Contact with Patient 06/25/13 1531     Chief Complaint  Patient presents with  . Altered Mental Status   (Consider location/radiation/quality/duration/timing/severity/associated sxs/prior Treatment) HPI Pt is an 79yo male BIB EMS from dialysis due to AMS.  EMS states pt told dialysis he started to feel bad and became irritable, did not finish dialysis.  Pt stated he was hurting all over.  CBG by EMS was 70, gave half amp D10.  Wife is with pt who states pt is confused from time to time but is acting like his normal self. Reports fall yesterday but was evaluated by his PCP who is also his cardiologist, Dr. Algie Coffer.  Everything went well during office visit per wife.  Pt does have hx of lung cancer but not being tx at this time.  Pt also has hx of chronic bilateral leg pain for which he takes tylenol 1-2x/day.  Denies any other complaints or concerns at this time. Denies hx of fever, n/v/d. Denies hitting head during fall yesterday.  Past Medical History  Diagnosis Date  . Hypertension   . CAD (coronary artery disease)     hx MI x 4  . Hyperlipidemia   . Gout   . Arthritis   . Anxiety   . Wears hearing aid   . Neuromuscular disorder     sciatica right leg  . ESRD on hemodialysis     MWF at [facility], started HD on January 09, 2011.  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 Had bilat ureterolysis with omental wrap surgery 2003 by Dr. Patsi Sears.  . Cancer 2010    - monitoring- pt did not want tx  . Blood transfusion   . Dysrhythmia   . COPD (chronic obstructive pulmonary disease)   . Pneumonia 2009  . Anemia   . Adrenal mass     resected 2003 at time of urologic surgery for RP  fibrosis   Past Surgical History  Procedure Laterality Date  . Nose surgery      cauterized for numerous bleeds  . Kidney surgery    . Insertion of dialysis catheter    . US echocardiography    . Av  fistula placement  07/11/2011    Procedure: ARTERIOVENOUS (AV) FISTULA CREATION;  Surgeon: Juleen China, MD;  Location: [facility] OR;  Service: Vascular;  Laterality: Left;  . Coronary angioplasty with stent placement  2010  . Diagnostic laparoscopy      remove scar tissue  . Eye surgery      right cataract surgery  . Cataract extraction w/phaco  08/14/2011    Procedure: CATARACT EXTRACTION PHACO AND INTRAOCULAR LENS PLACEMENT (IOC);  Surgeon: Chalmers Guest, MD;  Location: [facility] OR;  Service: Ophthalmology;  Laterality: Left;  . Ligation of arteriovenous  fistula  07/07/2012    Procedure: LIGATION OF ARTERIOVENOUS  FISTULA;  Surgeon: Sherren Kerns, MD;  Location: [facility] OR;  Service: Vascular;  Laterality: Left;  . Insertion of dialysis catheter  07/07/2012    Procedure: INSERTION OF DIALYSIS CATHETER;  Surgeon: Sherren Kerns, MD;  Location: [facility] OR;  Service: Vascular;  Laterality: Left;  Ultrasound guided  . Av fistula placement Left 10/01/2012    Procedure: INSERTION OF ARTERIOVENOUS (AV) GORE-TEX GRAFT ARM;  Surgeon: Nada Libman, MD;  Location: [facility] OR;  Service: Vascular;  Laterality: Left;  Ultrasound guided  . Chest tube insertion  2011   Family History  Problem Relation Age of Onset  . Heart disease Mother   . Hypertension Mother   . Heart disease Father   . COPD Father   . Hypertension Sister   . Heart disease Sister   . Anesthesia problems Neg Hx    History  Substance Use Topics  . Smoking status: Former Smoker -- 1.00 packs/day for 30 years    Types: Cigarettes    Quit date: 05/31/1963  . Smokeless tobacco: Former Neurosurgeon    Quit date: 04/07/1937  . Alcohol Use: No    Review of Systems  Constitutional: Negative for fever and chills.  Musculoskeletal: Positive for arthralgias and myalgias.       Bilateral legs and knees (chronic)  Psychiatric/Behavioral: Positive for confusion ( intermittent per wife).  All other systems reviewed and are negative.    Allergies  Caffeine;  Ibuprofen; Pork-derived products; Shellfish allergy; and [place]  Home Medications   Current Outpatient Rx  Name  Route  Sig  Dispense  Refill  . albuterol (PROVENTIL) (2.5 MG/3ML) 0.083% nebulizer solution   Nebulization   Take 2.5 mg by nebulization 2 (two) times daily as needed for wheezing.         . AMBULATORY NON FORMULARY MEDICATION   Topical   Apply 1 application topically as needed. Medication Name: [facility] Made Joint Support         . amLODipine (NORVASC) 10 MG tablet   Oral   Take 5 mg by mouth every morning.          . cloNIDine (CATAPRES) 0.2 MG tablet   Oral   Take 0.1 mg by mouth 2 (two) times daily as needed (for high blood pressure).          . isosorbide mononitrate (IMDUR) 60 MG 24 hr tablet   Oral   Take 30 mg by mouth 2 (two) times daily.          Marland Kitchen lisinopril (PRINIVIL,ZESTRIL) 20 MG tablet   Oral   Take 10 mg by mouth at bedtime.          . lovastatin (MEVACOR) 20 MG tablet   Oral   Take 20 mg by mouth at bedtime.          . montelukast (SINGULAIR) 10 MG tablet   Oral   Take 10 mg by mouth at bedtime.          . multivitamin (RENA-VIT) TABS tablet   Oral   Take 1 tablet by mouth at bedtime.         . nitroGLYCERIN (NITROSTAT) 0.4 MG SL tablet   Sublingual   Place 0.4 mg under the tongue every 5 (five) minutes as needed. For chest pain         . Nutritional Supplements (FEEDING SUPPLEMENT, NEPRO CARB STEADY,) LIQD   Oral   Take 237 mLs by mouth daily.         . Whey Protein POWD   Oral   Take 1 application by mouth daily.          BP 134/77  Pulse 84  Temp(Src) 98.3 F (36.8 C) (Oral)  Resp 22  SpO2 100% Physical Exam  Nursing note and vitals reviewed. Constitutional: He is oriented to person, place, and time. He appears well-developed and well-nourished.  Frail elderly male lying in exam bed, NAD.  HENT:  Head: Normocephalic and atraumatic.  Eyes: Conjunctivae are normal. No scleral icterus.  Neck: Normal  range of  motion.  Cardiovascular: Normal rate, regular rhythm and normal heart sounds.   Pulmonary/Chest: Effort normal and breath sounds normal. No respiratory distress. He has no wheezes. He has no rales. He exhibits no tenderness.  Abdominal: Soft. Bowel sounds are normal. He exhibits no distension and no mass. There is no tenderness. There is no rebound and no guarding.  Musculoskeletal: Normal range of motion. He exhibits tenderness ( bilateral knees and calves.  No erythema or warmth.). He exhibits no edema.  Neurological: He is alert and oriented to person, place, and time. No cranial nerve deficit.  Pt alert and oriented. Acting appropriately per wife.  Skin: Skin is warm and dry.    ED Course  Procedures (including critical care time) Labs Review Labs Reviewed  BASIC METABOLIC PANEL - Abnormal; Notable for the following:    BUN 35 (*)    Creatinine, Ser 5.52 (*)    GFR calc non Af Amer 8 (*)    GFR calc Af Amer 10 (*)    All other components within normal limits  CBC WITH DIFFERENTIAL - Abnormal; Notable for the following:    RBC 2.67 (*)    Hemoglobin 7.0 (*)    HCT 21.2 (*)    RDW 17.0 (*)    Lymphocytes Relative 10 (*)    Monocytes Relative 14 (*)    Monocytes Absolute 1.3 (*)    Eosinophils Relative 10 (*)    Eosinophils Absolute 1.0 (*)    All other components within normal limits  GLUCOSE, CAPILLARY   Imaging Review No results found.  EKG Interpretation   None       MDM   1. Altered mental status   2. Chronic leg pain, left   3. Chronic leg pain, right    Pt BIB EMS from dialysis for AMS.  Wife states pt is normal per baseline. Does admit he has intermittent confusion but was just evaluated yesterday by cardiologist/PCP for regular check as well as f/u after fall yesterday, everything was "normal" per wife.  Pt c/o bilateral leg pain but this is chronic.  No hx if LOC or hitting head during fall yesterday.    Discussed pt with Dr. Deretha Emory.  Will get  CBC and BMP as it is unclear how much of dialysis pt completed today.  Labs consistent with baseline, will discharge home and have f/u with dialysis and PCP as normally scheduled.  Pt and wife verbalized understanding and agreement with tx plan.    Junius Finner, PA-C 06/25/13 1807  Junius Finner, PA-C 06/25/13 [phone]

## 2013-06-25 NOTE — ED Notes (Signed)
Per MD; gauze applied to fistula site. Brill noted.

## 2013-06-25 NOTE — ED Notes (Signed)
Pt from dialysis; did not finish because he started feeling bad, full body pain, irritable. CBG 70; EMS gave half amp of D10. Sinus on monitor per EMS. Fell yesterday morning. Does get confused from time to time per wife.

## 2013-06-25 NOTE — ED Notes (Signed)
Pt is neurologically intact per baseline according to wife. Has lung CA and family decided no tx. Pt has leg pain that is chronic and takes tylenol daily for it. He reports he told the dialysis he was hurting and they took him off.

## 2013-06-25 NOTE — ED Provider Notes (Signed)
Medical screening examination/treatment/procedure(s) were conducted as a shared visit with non-physician practitioner(s) and myself.  I personally evaluated the patient during the encounter.  EKG Interpretation   None      Results for orders placed during the hospital encounter of 06/25/13  GLUCOSE, CAPILLARY      Result Value Range   Glucose-Capillary 86  70 - 99 mg/dL  BASIC METABOLIC PANEL      Result Value Range   Sodium 136  135 - 145 mEq/L   Potassium 4.0  3.5 - 5.1 mEq/L   Chloride 96  96 - 112 mEq/L   CO2 29  19 - 32 mEq/L   Glucose, Bld 77  70 - 99 mg/dL   BUN 35 (*) 6 - 23 mg/dL   Creatinine, Ser 1.61 (*) 0.50 - 1.35 mg/dL   Calcium 8.4  8.4 - 09.6 mg/dL   GFR calc non Af Amer 8 (*) >90 mL/min   GFR calc Af Amer 10 (*) >90 mL/min  CBC WITH DIFFERENTIAL      Result Value Range   WBC 9.7  4.0 - 10.5 K/uL   RBC 2.67 (*) 4.22 - 5.81 MIL/uL   Hemoglobin 7.0 (*) 13.0 - 17.0 g/dL   HCT 04.5 (*) 40.9 - 81.1 %   MCV 79.4  78.0 - 100.0 fL   MCH 26.2  26.0 - 34.0 pg   MCHC 33.0  30.0 - 36.0 g/dL   RDW 91.4 (*) 78.2 - 95.6 %   Platelets 239  150 - 400 K/uL   Neutrophils Relative % 66  43 - 77 %   Neutro Abs 6.4  1.7 - 7.7 K/uL   Lymphocytes Relative 10 (*) 12 - 46 %   Lymphs Abs 1.0  0.7 - 4.0 K/uL   Monocytes Relative 14 (*) 3 - 12 %   Monocytes Absolute 1.3 (*) 0.1 - 1.0 K/uL   Eosinophils Relative 10 (*) 0 - 5 %   Eosinophils Absolute 1.0 (*) 0.0 - 0.7 K/uL   Basophils Relative 0  0 - 1 %   Basophils Absolute 0.0  0.0 - 0.1 K/uL   According to the patient's wife they got about half way to [place] on him today. They will contact to see if they need to do anything earlier or any of his off days. Today since potassium here today is fine. Patient's spouse states that his mental status is baseline for him there is anything newer worse about it at times he gets confused. Patient is stable for discharge home.  Shelda Jakes, MD 06/25/13 [phone]

## 2013-06-25 NOTE — ED Notes (Signed)
PT ambulated with baseline gait; VSS; A&Ox3; no signs of distress; respirations even and unlabored; skin warm and dry; no questions upon discharge.  

## 2013-06-25 NOTE — ED Notes (Signed)
PA at bedside.

## 2013-06-25 NOTE — Discharge Instructions (Signed)
You were evaluated for altered mental status and recheck of electrolytes after partial dialysis earlier today.  No emergent process appears to be taking place at this time.  Be sure to follow up with dialysis and Dr. Algie Coffer for ongoing healthcare needs.  If patient appears to be having new onset change in mental status, or other concerning symptoms develop, be sure to call 911 and return to ER for further evaluation.  Altered Mental Status Altered mental status most often refers to an abnormal change in your responsiveness and awareness. It can affect your speech, thought, mobility, memory, attention span, or alertness. It can range from slight confusion to complete unresponsiveness (coma). Altered mental status can be a sign of a serious underlying medical condition. Rapid evaluation and medical treatment is necessary for patients having an altered mental status. CAUSES   Low blood sugar (hypoglycemia) or diabetes.  Severe loss of body fluids (dehydration) or a body salt (electrolyte) imbalance.  A stroke or other neurologic problem, such as dementia or delirium.  A head injury or tumor.  A drug or alcohol overdose.  Exposure to toxins or poisons.  Depression, anxiety, and stress.  A low oxygen level (hypoxia).  An infection.  Blood loss.  Twitching or shaking (seizure).  Heart problems, such as heart attack or heart rhythm problems (arrhythmias).  A body temperature that is too low or too high (hypothermia or hyperthermia). DIAGNOSIS  A diagnosis is based on your history, symptoms, physical and neurologic examinations, and diagnostic tests. Diagnostic tests may include:  Measurement of your blood pressure, pulse, breathing, and oxygen levels (vital signs).  Blood tests.  Urine tests.  X-ray exams.  A computerized magnetic scan (magnetic resonance imaging, MRI).  A computerized X-ray scan (computed tomography, CT scan). TREATMENT  Treatment will depend on the cause.  Treatment may include:  Management of an underlying medical or mental health condition.  Critical care or support in the hospital. HOME CARE INSTRUCTIONS   Only take over-the-counter or prescription medicines for pain, discomfort, or fever as directed by your caregiver.  Manage underlying conditions as directed by your caregiver.  Eat a healthy, well-balanced diet to maintain strength.  Join a support group or prevention program to cope with the condition or trauma that caused the altered mental status. Ask your caregiver to help choose a program that works for you.  Follow up with your caregiver for further examination, therapy, or testing as directed. SEEK MEDICAL CARE IF:   You feel unwell or have chills.  You or your family notice a change in your behavior or your alertness.  You have trouble following your caregiver's treatment plan.  You have questions or concerns. SEEK IMMEDIATE MEDICAL CARE IF:   You have a rapid heartbeat or have chest pain.  You have difficulty breathing.  You have a fever.  You have a headache with a stiff neck.  You cough up blood.  You have blood in your urine or stool.  You have severe agitation or confusion. MAKE SURE YOU:   Understand these instructions.  Will watch your condition.  Will get help right away if you are not doing well or get worse. Document Released: 12/26/2009 Document Revised: 09/30/2011 Document Reviewed: 12/26/2009 [facility] Patient Information 2014 [place], [place].

## 2013-06-29 ENCOUNTER — Ambulatory Visit (HOSPITAL_COMMUNITY)
Admission: RE | Admit: 2013-06-29 | Discharge: 2013-06-29 | Disposition: A | Discharge status: Home / Self Care | Payer: Medicare Other | Source: Ambulatory Visit | Attending: Nephrology | Admitting: Nephrology

## 2013-06-29 ENCOUNTER — Other Ambulatory Visit (HOSPITAL_COMMUNITY): Payer: Self-pay | Admitting: Nephrology

## 2013-06-29 DIAGNOSIS — N186 End stage renal disease: Secondary | ICD-10-CM | POA: Insufficient documentation

## 2013-06-29 DIAGNOSIS — Y832 Surgical operation with anastomosis, bypass or graft as the cause of abnormal reaction of the patient, or of later complication, without mention of misadventure at the time of the procedure: Secondary | ICD-10-CM | POA: Insufficient documentation

## 2013-06-29 DIAGNOSIS — Z992 Dependence on renal dialysis: Secondary | ICD-10-CM | POA: Insufficient documentation

## 2013-06-29 DIAGNOSIS — T82898A Other specified complication of vascular prosthetic devices, implants and grafts, initial encounter: Secondary | ICD-10-CM | POA: Insufficient documentation

## 2013-06-29 DIAGNOSIS — T829XXA Unspecified complication of cardiac and vascular prosthetic device, implant and graft, initial encounter: Secondary | ICD-10-CM

## 2013-06-29 MED ORDER — IOHEXOL 300 MG/ML  SOLN
100.0000 mL | Freq: Once | INTRAMUSCULAR | Status: AC | PRN
  Administered 2013-06-29: 60 mL via INTRAVENOUS

## 2013-06-29 NOTE — Procedures (Signed)
Technically successful shuntogram with angioplasty.   Procedure terminated prematurely d/t pt's combative behavior. Otherwise, no immediate complications.

## 2013-07-02 ENCOUNTER — Encounter (HOSPITAL_COMMUNITY): Payer: Self-pay | Admitting: Emergency Medicine

## 2013-07-02 ENCOUNTER — Inpatient Hospital Stay (HOSPITAL_COMMUNITY)
Admission: EM | Admit: 2013-07-02 | Discharge: 2013-07-04 | DRG: 811 | Disposition: A | Discharge status: Home / Self Care | Payer: Medicare Other | Attending: Cardiovascular Disease | Admitting: Cardiovascular Disease

## 2013-07-02 DIAGNOSIS — Z992 Dependence on renal dialysis: Secondary | ICD-10-CM

## 2013-07-02 DIAGNOSIS — Z79899 Other long term (current) drug therapy: Secondary | ICD-10-CM

## 2013-07-02 DIAGNOSIS — I10 Essential (primary) hypertension: Secondary | ICD-10-CM

## 2013-07-02 DIAGNOSIS — D631 Anemia in chronic kidney disease: Principal | ICD-10-CM | POA: Diagnosis present

## 2013-07-02 DIAGNOSIS — C3490 Malignant neoplasm of unspecified part of unspecified bronchus or lung: Secondary | ICD-10-CM | POA: Diagnosis present

## 2013-07-02 DIAGNOSIS — Z9861 Coronary angioplasty status: Secondary | ICD-10-CM

## 2013-07-02 DIAGNOSIS — E785 Hyperlipidemia, unspecified: Secondary | ICD-10-CM | POA: Diagnosis present

## 2013-07-02 DIAGNOSIS — Z87891 Personal history of nicotine dependence: Secondary | ICD-10-CM

## 2013-07-02 DIAGNOSIS — M899 Disorder of bone, unspecified: Secondary | ICD-10-CM | POA: Diagnosis present

## 2013-07-02 DIAGNOSIS — D500 Iron deficiency anemia secondary to blood loss (chronic): Secondary | ICD-10-CM | POA: Diagnosis present

## 2013-07-02 DIAGNOSIS — J90 Pleural effusion, not elsewhere classified: Secondary | ICD-10-CM | POA: Diagnosis present

## 2013-07-02 DIAGNOSIS — I2510 Atherosclerotic heart disease of native coronary artery without angina pectoris: Secondary | ICD-10-CM | POA: Diagnosis present

## 2013-07-02 DIAGNOSIS — D72829 Elevated white blood cell count, unspecified: Secondary | ICD-10-CM | POA: Diagnosis present

## 2013-07-02 DIAGNOSIS — I120 Hypertensive chronic kidney disease with stage 5 chronic kidney disease or end stage renal disease: Secondary | ICD-10-CM | POA: Diagnosis present

## 2013-07-02 DIAGNOSIS — N186 End stage renal disease: Secondary | ICD-10-CM

## 2013-07-02 DIAGNOSIS — Z66 Do not resuscitate: Secondary | ICD-10-CM | POA: Diagnosis present

## 2013-07-02 DIAGNOSIS — N189 Chronic kidney disease, unspecified: Secondary | ICD-10-CM

## 2013-07-02 DIAGNOSIS — J4489 Other specified chronic obstructive pulmonary disease: Secondary | ICD-10-CM | POA: Diagnosis present

## 2013-07-02 DIAGNOSIS — R918 Other nonspecific abnormal finding of lung field: Secondary | ICD-10-CM

## 2013-07-02 DIAGNOSIS — M129 Arthropathy, unspecified: Secondary | ICD-10-CM | POA: Diagnosis present

## 2013-07-02 DIAGNOSIS — N2581 Secondary hyperparathyroidism of renal origin: Secondary | ICD-10-CM | POA: Diagnosis present

## 2013-07-02 DIAGNOSIS — H9190 Unspecified hearing loss, unspecified ear: Secondary | ICD-10-CM | POA: Diagnosis present

## 2013-07-02 DIAGNOSIS — J449 Chronic obstructive pulmonary disease, unspecified: Secondary | ICD-10-CM | POA: Diagnosis present

## 2013-07-02 LAB — CBC WITH DIFFERENTIAL/PLATELET
Basophils Absolute: 0 10*3/uL (ref 0.0–0.1)
HCT: 20 % — ABNORMAL LOW (ref 39.0–52.0)
Lymphocytes Relative: 5 % — ABNORMAL LOW (ref 12–46)
Neutro Abs: 9.5 10*3/uL — ABNORMAL HIGH (ref 1.7–7.7)
Platelets: 203 10*3/uL (ref 150–400)
RBC: 2.5 MIL/uL — ABNORMAL LOW (ref 4.22–5.81)
RDW: 17 % — ABNORMAL HIGH (ref 11.5–15.5)
WBC: 12.8 10*3/uL — ABNORMAL HIGH (ref 4.0–10.5)

## 2013-07-02 LAB — BASIC METABOLIC PANEL
CO2: 28 mEq/L (ref 19–32)
Chloride: 94 mEq/L — ABNORMAL LOW (ref 96–112)
Potassium: 5.1 mEq/L (ref 3.5–5.1)
Sodium: 134 mEq/L — ABNORMAL LOW (ref 135–145)

## 2013-07-02 LAB — TROPONIN I: Troponin I: <0.3 ng/mL (ref <0.30)

## 2013-07-02 MED ORDER — ALUM & MAG HYDROXIDE-SIMETH 200-200-20 MG/5ML PO SUSP: 30.0000 mL | Freq: Four times a day (QID) | ORAL | Status: DC | PRN

## 2013-07-02 MED ORDER — ACETAMINOPHEN 325 MG PO TABS
650.0000 mg | ORAL_TABLET | Freq: Four times a day (QID) | ORAL | Status: DC | PRN
  Administered 2013-07-03 – 2013-07-04 (×3): 650 mg via ORAL
  Filled 2013-07-02 (×3): qty 2

## 2013-07-02 MED ORDER — LISINOPRIL 20 MG PO TABS
20.0000 mg | ORAL_TABLET | Freq: Every day | ORAL | Status: DC
  Administered 2013-07-02: 20 mg via ORAL
  Filled 2013-07-02 (×3): qty 1

## 2013-07-02 MED ORDER — SIMVASTATIN 10 MG PO TABS
10.0000 mg | ORAL_TABLET | Freq: Every day | ORAL | Status: DC
  Administered 2013-07-03: 10 mg via ORAL
  Filled 2013-07-02 (×2): qty 1

## 2013-07-02 MED ORDER — MONTELUKAST SODIUM 10 MG PO TABS
10.0000 mg | ORAL_TABLET | Freq: Every day | ORAL | Status: DC
  Administered 2013-07-02 – 2013-07-03 (×2): 10 mg via ORAL
  Filled 2013-07-02 (×3): qty 1

## 2013-07-02 MED ORDER — ACETAMINOPHEN 650 MG RE SUPP: 650.0000 mg | Freq: Four times a day (QID) | RECTAL | Status: DC | PRN

## 2013-07-02 MED ORDER — ONDANSETRON HCL 4 MG PO TABS: 4.0000 mg | ORAL_TABLET | Freq: Four times a day (QID) | ORAL | Status: DC | PRN

## 2013-07-02 MED ORDER — CLONIDINE HCL 0.1 MG PO TABS
0.1000 mg | ORAL_TABLET | Freq: Every day | ORAL | Status: DC
  Administered 2013-07-02 – 2013-07-03 (×2): 0.1 mg via ORAL
  Filled 2013-07-02 (×3): qty 1

## 2013-07-02 MED ORDER — ISOSORBIDE MONONITRATE ER 30 MG PO TB24
30.0000 mg | ORAL_TABLET | Freq: Two times a day (BID) | ORAL | Status: DC
  Administered 2013-07-02 – 2013-07-04 (×4): 30 mg via ORAL
  Filled 2013-07-02 (×5): qty 1

## 2013-07-02 MED ORDER — RENA-VITE PO TABS
1.0000 | ORAL_TABLET | Freq: Every day | ORAL | Status: DC
  Administered 2013-07-02: 23:00:00 via ORAL
  Administered 2013-07-03: 1 via ORAL
  Filled 2013-07-02 (×3): qty 1

## 2013-07-02 MED ORDER — ONDANSETRON HCL 4 MG/2ML IJ SOLN: 4.0000 mg | Freq: Four times a day (QID) | INTRAMUSCULAR | Status: DC | PRN

## 2013-07-02 MED ORDER — AMLODIPINE BESYLATE 5 MG PO TABS
5.0000 mg | ORAL_TABLET | Freq: Every morning | ORAL | Status: DC
  Administered 2013-07-03 – 2013-07-04 (×2): 5 mg via ORAL
  Filled 2013-07-02 (×2): qty 1

## 2013-07-02 NOTE — ED Provider Notes (Signed)
CSN: [id number]     Arrival date & time 07/02/13  1111 History   First MD Initiated Contact with Patient 07/02/13 1142     Chief Complaint  Patient presents with  . Abnormal Lab   (Consider location/radiation/quality/duration/timing/severity/associated sxs/prior Treatment) The history is provided by the patient and medical records.   This is an 79 year old male with extensive past medical history significant for hypertension, CAD, hyperlipidemia, CAD on hemodialysis, lung cancer, presenting to the ED for abnormal labs. Patient was notified by dialysis this morning that his hemoglobin was low.  Pt has hx of the same requiring transfusion.  Denies any recent tarry or bloody stools.  Does have some hemoptysis from his lung cancer at baseline-- this is unchanged.  Pt has elected against treatment at this time.  Denies any current chest pain, SOB, or palpitations.  Does endorse some generalized weakness and fatigue.  Pt not currently on any anti-coagulants.  Past Medical History  Diagnosis Date  . Hypertension   . CAD (coronary artery disease)     hx MI x 4  . Hyperlipidemia   . Gout   . Arthritis   . Anxiety   . Wears hearing aid   . Neuromuscular disorder     sciatica right leg  . ESRD on hemodialysis     MWF at [facility], started HD on January 09, 2011.  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 Had bilat ureterolysis with omental wrap surgery 2003 by Dr. Patsi Sears.  . Cancer 2010    - monitoring- pt did not want tx  . Blood transfusion   . Dysrhythmia   . COPD (chronic obstructive pulmonary disease)   . Pneumonia 2009  . Anemia   . Adrenal mass     resected 2003 at time of urologic surgery for RP  fibrosis   Past Surgical History  Procedure Laterality Date  . Nose surgery      cauterized for numerous bleeds  . Kidney surgery    . Insertion of dialysis catheter    . US echocardiography    . Av fistula placement  07/11/2011    Procedure: ARTERIOVENOUS (AV)  FISTULA CREATION;  Surgeon: Juleen China, MD;  Location: [facility] OR;  Service: Vascular;  Laterality: Left;  . Coronary angioplasty with stent placement  2010  . Diagnostic laparoscopy      remove scar tissue  . Eye surgery      right cataract surgery  . Cataract extraction w/phaco  08/14/2011    Procedure: CATARACT EXTRACTION PHACO AND INTRAOCULAR LENS PLACEMENT (IOC);  Surgeon: Chalmers Guest, MD;  Location: [facility] OR;  Service: Ophthalmology;  Laterality: Left;  . Ligation of arteriovenous  fistula  07/07/2012    Procedure: LIGATION OF ARTERIOVENOUS  FISTULA;  Surgeon: Sherren Kerns, MD;  Location: [facility] OR;  Service: Vascular;  Laterality: Left;  . Insertion of dialysis catheter  07/07/2012    Procedure: INSERTION OF DIALYSIS CATHETER;  Surgeon: Sherren Kerns, MD;  Location: [facility] OR;  Service: Vascular;  Laterality: Left;  Ultrasound guided  . Av fistula placement Left 10/01/2012    Procedure: INSERTION OF ARTERIOVENOUS (AV) GORE-TEX GRAFT ARM;  Surgeon: Nada Libman, MD;  Location: [facility] OR;  Service: Vascular;  Laterality: Left;  Ultrasound guided  . Chest tube insertion  2011   Family History  Problem Relation Age of Onset  . Heart disease Mother   . Hypertension Mother   . Heart disease Father   . COPD Father   .  Hypertension Sister   . Heart disease Sister   . Anesthesia problems Neg Hx    History  Substance Use Topics  . Smoking status: Former Smoker -- 1.00 packs/day for 30 years    Types: Cigarettes    Quit date: 05/31/1963  . Smokeless tobacco: Former Neurosurgeon    Quit date: 04/07/1937  . Alcohol Use: No    Review of Systems  Constitutional: Positive for fatigue.       Abnormal lab  All other systems reviewed and are negative.    Allergies  Caffeine; Ibuprofen; Pork-derived products; Shellfish allergy; and Tarka  Home Medications   Current Outpatient Rx  Name  Route  Sig  Dispense  Refill  . albuterol (PROVENTIL) (2.5 MG/3ML) 0.083% nebulizer solution    Nebulization   Take 2.5 mg by nebulization 2 (two) times daily as needed for wheezing.         . AMBULATORY NON FORMULARY MEDICATION   Topical   Apply 1 application topically 2 (two) times daily. Medication Name: [facility] Made Joint Support         . amLODipine (NORVASC) 10 MG tablet   Oral   Take 5 mg by mouth every morning.          . cloNIDine (CATAPRES) 0.2 MG tablet   Oral   Take 0.1 mg by mouth at bedtime.          . isosorbide mononitrate (IMDUR) 60 MG 24 hr tablet   Oral   Take 30 mg by mouth 2 (two) times daily.          Marland Kitchen lisinopril (PRINIVIL,ZESTRIL) 20 MG tablet   Oral   Take 20 mg by mouth at bedtime.          . lovastatin (MEVACOR) 20 MG tablet   Oral   Take 20 mg by mouth at bedtime.          . montelukast (SINGULAIR) 10 MG tablet   Oral   Take 10 mg by mouth at bedtime.          . multivitamin (RENA-VIT) TABS tablet   Oral   Take 1 tablet by mouth at bedtime.         . nitroGLYCERIN (NITROSTAT) 0.4 MG SL tablet   Sublingual   Place 0.4 mg under the tongue every 5 (five) minutes as needed. For chest pain         . OVER THE COUNTER MEDICATION   Oral   Take 1 each by mouth daily as needed (for non HD days. A milk protein product).          BP 112/54  Pulse 52  Temp(Src) 98.5 F (36.9 C) (Oral)  Resp 22  Wt 200 lb (90.719 kg)  SpO2 91%  Physical Exam  Nursing note and vitals reviewed. Constitutional: He is oriented to person, place, and time. He appears well-developed and well-nourished. No distress.  HENT:  Head: Normocephalic and atraumatic.  Mouth/Throat: Oropharynx is clear and moist.  Eyes: Conjunctivae and EOM are normal. Pupils are equal, round, and reactive to light.  Neck: Normal range of motion.  Cardiovascular: Normal rate, regular rhythm and normal heart sounds.   Pulmonary/Chest: Effort normal and breath sounds normal. No respiratory distress. He has no wheezes.  Abdominal: Soft. Bowel sounds are normal. There  is no tenderness. There is no guarding.  Musculoskeletal: Normal range of motion.  AV fistula LUE; strong thrill present  Neurological: He is alert and oriented to person, place,  and time. He has normal strength. He displays no tremor. No cranial nerve deficit or sensory deficit. He displays no seizure activity.  AAOx3; answering questions appropriately; CN grossly intact, moves all extremities appropriately without ataxia, no focal neuro deficits or facial droop appreciated  Skin: Skin is warm and dry. He is not diaphoretic.  Psychiatric: He has a normal mood and affect.    ED Course  Procedures (including critical care time) Labs Review Labs Reviewed  CBC WITH DIFFERENTIAL - Abnormal; Notable for the following:    WBC 12.8 (*)    RBC 2.50 (*)    Hemoglobin 6.5 (*)    HCT 20.0 (*)    RDW 17.0 (*)    Neutro Abs 9.5 (*)    Lymphocytes Relative 5 (*)    Monocytes Absolute 1.5 (*)    Eosinophils Relative 8 (*)    Eosinophils Absolute 1.1 (*)    All other components within normal limits  BASIC METABOLIC PANEL  TROPONIN I  TYPE AND SCREEN  PREPARE RBC (CROSSMATCH)   Imaging Review No results found.  EKG Interpretation    Date/Time:  Friday July 02 2013 11:56:42 EST Ventricular Rate:  77 PR Interval:  188 QRS Duration: 81 QT Interval:  405 QTC Calculation: 458 R Axis:   53 Text Interpretation:  Sinus rhythm Borderline low voltage, extremity leads Anteroseptal infarct, old Baseline wander in lead(s) V2 Confirmed by Bebe Shaggy  MD, DONALD [phone]) on 07/02/2013 12:07:39 PM            MDM   1. Anemia associated with chronic renal failure   2. Lung mass   3. Hypertension   4. End stage renal disease    EKG NSR, no acute ischemic changes.  Trop negative. H/H low at 6.5/20-- packed RBCs ordered.  Spoke with Dr. Algie Coffer who has seen and evaluated pt in the ED-- he will admit.  Nephrology has been consulted and will schedule for dialysis.  VS stable for transfer to  floor.  Garlon Hatchet, PA-C 07/02/13 1539

## 2013-07-02 NOTE — Procedures (Signed)
Patient seen on Hemodialysis. QB 400, UF goal 3.5L Treatment adjusted as needed.  Zetta Bills MD [facility]. Office # [phone] Pager # [phone] 5:11 PM

## 2013-07-02 NOTE — ED Notes (Signed)
Critical value reported by lab. Hgb 6.5 notified EDP.

## 2013-07-02 NOTE — ED Notes (Signed)
Paged Dr. Algie Coffer to [phone]

## 2013-07-02 NOTE — ED Provider Notes (Signed)
Medical screening examination/treatment/procedure(s) were conducted as a shared visit with non-physician practitioner(s) and myself.  I personally evaluated the patient during the encounter.  EKG Interpretation    Date/Time:  Friday July 02 2013 11:56:42 EST Ventricular Rate:  77 PR Interval:  188 QRS Duration: 81 QT Interval:  405 QTC Calculation: 458 R Axis:   53 Text Interpretation:  Sinus rhythm Borderline low voltage, extremity leads Anteroseptal infarct, old Baseline wander in lead(s) V2 Confirmed by Bebe Shaggy  MD, Alante Tolan [phone]) on 07/02/2013 12:07:39 PM             Pt stable in the ED and he is comfortable with plan for admission D/w dr Clayton Lefort, MD 07/02/13 [phone]

## 2013-07-02 NOTE — H&P (Signed)
[name] is an 79 y.o. male.   Chief Complaint: Weakness HPI: 79 years old male with End stage renal disease, CAD, hypertension and lung cancer has low hgb of 6.5 with weakness but no chest pain or shortness of breath.  Past Medical History  Diagnosis Date  . Hypertension   . CAD (coronary artery disease)     hx MI x 4  . Hyperlipidemia   . Gout   . Arthritis   . Anxiety   . Wears hearing aid   . Neuromuscular disorder     sciatica right leg  . ESRD on hemodialysis     MWF at [facility], started HD on January 09, 2011.  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 Had bilat ureterolysis with omental wrap surgery 2003 by Dr. Patsi Sears.  . Cancer 2010    - monitoring- pt did not want tx  . Blood transfusion   . Dysrhythmia   . COPD (chronic obstructive pulmonary disease)   . Pneumonia 2009  . Anemia   . Adrenal mass     resected 2003 at time of urologic surgery for RP  fibrosis      Past Surgical History  Procedure Laterality Date  . Nose surgery      cauterized for numerous bleeds  . Kidney surgery    . Insertion of dialysis catheter    . US echocardiography    . Av fistula placement  07/11/2011    Procedure: ARTERIOVENOUS (AV) FISTULA CREATION;  Surgeon: Juleen China, MD;  Location: [facility] OR;  Service: Vascular;  Laterality: Left;  . Coronary angioplasty with stent placement  2010  . Diagnostic laparoscopy      remove scar tissue  . Eye surgery      right cataract surgery  . Cataract extraction w/phaco  08/14/2011    Procedure: CATARACT EXTRACTION PHACO AND INTRAOCULAR LENS PLACEMENT (IOC);  Surgeon: Chalmers Guest, MD;  Location: [facility] OR;  Service: Ophthalmology;  Laterality: Left;  . Ligation of arteriovenous  fistula  07/07/2012    Procedure: LIGATION OF ARTERIOVENOUS  FISTULA;  Surgeon: Sherren Kerns, MD;  Location: [facility] OR;  Service: Vascular;  Laterality: Left;  . Insertion of dialysis catheter  07/07/2012    Procedure: INSERTION OF DIALYSIS  CATHETER;  Surgeon: Sherren Kerns, MD;  Location: [facility] OR;  Service: Vascular;  Laterality: Left;  Ultrasound guided  . Av fistula placement Left 10/01/2012    Procedure: INSERTION OF ARTERIOVENOUS (AV) GORE-TEX GRAFT ARM;  Surgeon: Nada Libman, MD;  Location: [facility] OR;  Service: Vascular;  Laterality: Left;  Ultrasound guided  . Chest tube insertion  2011    Family History  Problem Relation Age of Onset  . Heart disease Mother   . Hypertension Mother   . Heart disease Father   . COPD Father   . Hypertension Sister   . Heart disease Sister   . Anesthesia problems Neg Hx    Social History:  reports that he quit smoking about 50 years ago. His smoking use included Cigarettes. He has a 30 pack-year smoking history. He quit smokeless tobacco use about 76 years ago. He reports that he does not drink alcohol or use illicit drugs.  Allergies:  Allergies  Allergen Reactions  . Caffeine     Seventh day advantist  . Ibuprofen Other (See Comments)    Messed up kidneys  . Pork-Derived Products     Seventh day advantist  . Shellfish Allergy  Seventh day advantist  . Tarka [Trandolapril-Verapamil Hcl Er] Cough     (Not in a hospital admission)  Results for orders placed during the hospital encounter of 07/02/13 (from the past 48 hour(s))  CBC WITH DIFFERENTIAL     Status: Abnormal   Collection Time    07/02/13 12:20 PM      Result Value Range   WBC 12.8 (*) 4.0 - 10.5 K/uL   RBC 2.50 (*) 4.22 - 5.81 MIL/uL   Hemoglobin 6.5 (*) 13.0 - 17.0 g/dL   Comment: REPEATED TO VERIFY     CRITICAL RESULT CALLED TO, READ BACK BY AND VERIFIED WITH:     G NIKOLICH,RN AT 1247 07/02/13 BY K BARR   HCT 20.0 (*) 39.0 - 52.0 %   MCV 80.0  78.0 - 100.0 fL   MCH 26.0  26.0 - 34.0 pg   MCHC 32.5  30.0 - 36.0 g/dL   RDW 40.9 (*) 81.1 - 91.4 %   Platelets 203  150 - 400 K/uL   Neutrophils Relative % 74  43 - 77 %   Neutro Abs 9.5 (*) 1.7 - 7.7 K/uL   Lymphocytes Relative 5 (*) 12 - 46 %   Lymphs  Abs 0.7  0.7 - 4.0 K/uL   Monocytes Relative 12  3 - 12 %   Monocytes Absolute 1.5 (*) 0.1 - 1.0 K/uL   Eosinophils Relative 8 (*) 0 - 5 %   Eosinophils Absolute 1.1 (*) 0.0 - 0.7 K/uL   Basophils Relative 0  0 - 1 %   Basophils Absolute 0.0  0.0 - 0.1 K/uL  BASIC METABOLIC PANEL     Status: Abnormal   Collection Time    07/02/13 12:20 PM      Result Value Range   Sodium 134 (*) 135 - 145 mEq/L   Potassium 5.1  3.5 - 5.1 mEq/L   Chloride 94 (*) 96 - 112 mEq/L   CO2 28  19 - 32 mEq/L   Glucose, Bld 93  70 - 99 mg/dL   BUN 61 (*) 6 - 23 mg/dL   Creatinine, Ser 7.82 (*) 0.50 - 1.35 mg/dL   Calcium 8.3 (*) 8.4 - 10.5 mg/dL   GFR calc non Af Amer 5 (*) >90 mL/min   GFR calc Af Amer 6 (*) >90 mL/min   Comment: (NOTE)     The eGFR has been calculated using the CKD EPI equation.     This calculation has not been validated in all clinical situations.     eGFR's persistently <90 mL/min signify possible Chronic Kidney     Disease.  TYPE AND SCREEN     Status: None   Collection Time    07/02/13 12:20 PM      Result Value Range   ABO/RH(D) O POS     Antibody Screen NEG     Sample Expiration 07/05/2013     Unit Number [id number]     Blood Component Type RED CELLS,LR     Unit division 00     Status of Unit ALLOCATED     Transfusion Status OK TO TRANSFUSE     Crossmatch Result Compatible    TROPONIN I     Status: None   Collection Time    07/02/13 12:20 PM      Result Value Range   Troponin I <0.30  <0.30 ng/mL   Comment:            Due to the release kinetics  of cTnI,     a negative result within the first hours     of the onset of symptoms does not rule out     myocardial infarction with certainty.     If myocardial infarction is still suspected,     repeat the test at appropriate intervals.  PREPARE RBC (CROSSMATCH)     Status: None   Collection Time    07/02/13  1:30 PM      Result Value Range   Order Confirmation ORDER PROCESSED BY BLOOD BANK     No results  found.  ROS Wears glasses, + hearing loss, Wears dentures. + COPD, + CAD, + end stage renal disease, hypertension, lung mass and multiple joint arthritis. No hepatitis, stroke and seizures, Chronic anemia of renal disease and blood loss.   Blood pressure 130/64, pulse 74, temperature 98.5 F (36.9 C), temperature source Oral, resp. rate 28, weight 90.719 kg (200 lb), SpO2 99.00%. Physical exam: HEENT: [place]/AT. Brown eyes, conjunctiva-pale, Sclera- muddy. Wears reading glasses and partial upper dentures.  Neck: No JVD, supple.  Lungs-Few rhonchi, bilaterally.  CVS: Normal S1, S2. II/VI systolic murmur..  Abdomen: Soft, non-tender.  Extremities: Trace edema. No cyanosis or clubbing.  CNS; AXOX3. Moves all 4 ext  Assessment/Plan Weakness Anemia of chronic renal disease and blood loss  Hypertension  CAD  Arthritis  End stage renal disease with hemodialysis.  Lung mass with pleural effusion and hemoptysis-Chronic  Transfuse one unit of blood and hemodialysis here today. Discharge home if stable. Renal Dr. Allena Katz and Claud Kelp notified.   Jannette Cotham S 07/02/2013, 1:27 PM

## 2013-07-02 NOTE — ED Notes (Signed)
Pt is here because dialysis center called and told wife and told her that the patients' blood level was too low to travel and until it got better they could not travel.  The nurse at the dialysis stated his Hgb level was at 6+/- per wife.  Came to ED to get it checked out.

## 2013-07-02 NOTE — Progress Notes (Signed)
Towards the end of dialysis TX patient began to complain of chest pain. Reduced UF and applied Oxygen,  somewhat resolved... At the end of TX patient began to have chills, oral temp of 102.8, final BP 196/103. Alarmingly elevated contacted Dr. Marisue [name] was able to contact the attending to seek admission for patient .Marland KitchenMarland KitchenMarland Kitchen

## 2013-07-02 NOTE — ED Notes (Signed)
Pt here with family c/o low blood count; pt sts some generalized weakness but feels ok today; pt denies black or tarry stool

## 2013-07-03 ENCOUNTER — Encounter (HOSPITAL_COMMUNITY): Payer: Self-pay

## 2013-07-03 LAB — TYPE AND SCREEN
ABO/RH(D): O POS
Antibody Screen: NEGATIVE
Unit division: 0

## 2013-07-03 LAB — MRSA PCR SCREENING: MRSA by PCR: NEGATIVE

## 2013-07-03 LAB — CBC
MCH: 26.9 pg (ref 26.0–34.0)
MCHC: 33.6 g/dL (ref 30.0–36.0)
Platelets: 168 10*3/uL (ref 150–400)
RBC: 3.09 MIL/uL — ABNORMAL LOW (ref 4.22–5.81)

## 2013-07-03 MED ORDER — BIOTENE DRY MOUTH MT LIQD
15.0000 mL | Freq: Two times a day (BID) | OROMUCOSAL | Status: DC
  Administered 2013-07-04: 15 mL via OROMUCOSAL

## 2013-07-03 MED ORDER — NEPRO/CARBSTEADY PO LIQD
237.0000 mL | Freq: Two times a day (BID) | ORAL | Status: DC
  Administered 2013-07-03 – 2013-07-04 (×2): 237 mL via ORAL

## 2013-07-03 MED ORDER — DEXTROSE 5 % IV SOLN
1.0000 g | INTRAVENOUS | Status: DC
  Administered 2013-07-03 – 2013-07-04 (×2): 1 g via INTRAVENOUS
  Filled 2013-07-03 (×2): qty 10

## 2013-07-03 NOTE — Progress Notes (Signed)
Subjective:  Febrile all night. Hgb improved to 8.5   Objective:  Vital Signs in the last 24 hours: Temp:  [97.4 F (36.3 C)-104.6 F (40.3 C)] 100.3 F (37.9 C) (12/13 0945) Pulse Rate:  [72-111] 73 (12/13 0945) Cardiac Rhythm:  [-]  Resp:  [16-28] 18 (12/13 0945) BP: (103-196)/(54-103) 103/54 mmHg (12/13 0945) SpO2:  [96 %-100 %] 96 % (12/13 0945) Weight:  [80 kg (176 lb 5.9 oz)] 80 kg (176 lb 5.9 oz) (12/12 2121)  Physical Exam: BP Readings from Last 1 Encounters:  07/03/13 103/54     Wt Readings from Last 1 Encounters:  07/02/13 80 kg (176 lb 5.9 oz)    Weight change:   HEENT: [place]/AT, Eyes-Brown, PERL, EOMI, Conjunctiva-Pale, Sclera-Non-icteric Neck: No JVD, No bruit, Trachea midline. Lungs:  Clear, Bilateral. Cardiac:  Regular rhythm, normal S1 and S2, no S3.  Abdomen:  Soft, non-tender. Extremities:  No edema present. No cyanosis. No clubbing. CNS: AxOx3, Cranial nerves grossly intact, moves all 4 extremities. Right handed. Skin: Warm and dry.   Intake/Output from previous day: 12/12 0701 - 12/13 0700 In: 2712.5 [I.V.:2050; Blood:662.5] Out: 3133     Lab Results: BMET    Component Value Date/Time   NA 134* 07/02/2013 1220   K 5.1 07/02/2013 1220   CL 94* 07/02/2013 1220   CO2 28 07/02/2013 1220   GLUCOSE 93 07/02/2013 1220   BUN 61* 07/02/2013 1220   CREATININE 8.50* 07/02/2013 1220   CALCIUM 8.3* 07/02/2013 1220   GFRNONAA 5* 07/02/2013 1220   GFRAA 6* 07/02/2013 1220   CBC    Component Value Date/Time   WBC 20.3* 07/03/2013 1100   RBC 3.09* 07/03/2013 1100   HGB 8.3* 07/03/2013 1100   HCT 24.7* 07/03/2013 1100   PLT 168 07/03/2013 1100   MCV 79.9 07/03/2013 1100   MCH 26.9 07/03/2013 1100   MCHC 33.6 07/03/2013 1100   RDW 17.2* 07/03/2013 1100   LYMPHSABS 0.7 07/02/2013 1220   MONOABS 1.5* 07/02/2013 1220   EOSABS 1.1* 07/02/2013 1220   BASOSABS 0.0 07/02/2013 1220   CARDIAC ENZYMES Lab Results  Component Value Date   CKTOTAL 213  01/20/2012   CKMB 4.9* 01/20/2012   TROPONINI <0.30 07/02/2013    Scheduled Meds: . amLODipine  5 mg Oral q morning - 10a  . antiseptic oral rinse  15 mL Mouth Rinse BID  . cefTRIAXone (ROCEPHIN)  IV  1 g Intravenous Q24H  . cloNIDine  0.1 mg Oral QHS  . isosorbide mononitrate  30 mg Oral BID  . lisinopril  20 mg Oral QHS  . montelukast  10 mg Oral QHS  . multivitamin  1 tablet Oral QHS  . simvastatin  10 mg Oral q1800   Continuous Infusions:  PRN Meds:.acetaminophen, acetaminophen, alum & mag hydroxide-simeth, ondansetron (ZOFRAN) IV, ondansetron  Assessment/Plan:  Weakness  Anemia of chronic renal disease and blood loss  Hypertension  CAD  Arthritis  End stage renal disease with hemodialysis.  Lung mass with pleural effusion and hemoptysis-Chronic Fever/Leukocytosis  IV rocephin.   LOS: 1 day    Orpah Cobb  MD  07/03/2013, 11:54 AM

## 2013-07-03 NOTE — Progress Notes (Signed)
Patient's wife had removed red no slip socks.  Per wife patient does not like to wear these.  Reminded wife that floor may be slippery with his home socks and she agrees to put on his shoes if he is to be out of bed.

## 2013-07-03 NOTE — Progress Notes (Signed)
Unable to complete psychosocial assessment at this time due to wife in room.  Admission completed with wife.

## 2013-07-03 NOTE — Progress Notes (Signed)
Patient's temperature 104.6 rectal.  Dr. Algie Coffer notified.  Patient given Tylenol per MD order & ice packs placed in groin & underarm area.  Will continue to monitor.

## 2013-07-03 NOTE — Progress Notes (Signed)
INITIAL NUTRITION ASSESSMENT  DOCUMENTATION CODES Per approved criteria  -Not Applicable   INTERVENTION:  Recommend Renal 80/90-08-23-1198 ml diet  Nepro Carb Steady twice daily (425 kcals, 19.1 gm protein per 8 fl oz bottle) RD to follow for nutrition care plan  NUTRITION DIAGNOSIS: Increased nutrient needs related to ESRD on HD, catabolic illness as evidenced by estimated nutrition needs  Goal: Pt to meet >/= 90% of their estimated nutrition needs   Monitor:  PO & supplemental intake, weight, labs, I/O's  Reason for Assessment: Malnutrition Screening Tool Report  79 y.o. male  Admitting Dx: weakness  ASSESSMENT: Patient with PMH of ESRD on HD, CAD, HTN and lung cancer; admitted with low Hgb of 6.5 with weakness -- plan for one unit of blood transfusion and HD session in hospital.  RD unable to obtain nutrition hx at time of visit; PO intake good at 90% per flowsheet records; per weight readings, weight slightly fluctuates likely given volume; patient with increased nutrition needs given medical hx; would benefit from nutrition supplements -- RD to order.  RD unable to complete Nutrition Focused Physical Exam at this time for possible malnutrition identification.   Height: Ht Readings from Last 1 Encounters:  07/02/13 6\' 2"  (1.88 m)    Weight: Wt Readings from Last 1 Encounters:  07/02/13 176 lb 5.9 oz (80 kg)    Ideal Body Weight: 190 lb  % Ideal Body Weight: 93%  Wt Readings from Last 10 Encounters:  07/02/13 176 lb 5.9 oz (80 kg)  11/27/12 174 lb 13.2 oz (79.3 kg)  11/17/12 180 lb 3.2 oz (81.738 kg)  10/01/12 176 lb 5.9 oz (80 kg)  10/01/12 176 lb 5.9 oz (80 kg)  08/03/12 179 lb (81.194 kg)  07/07/12 187 lb 2.7 oz (84.9 kg)  07/07/12 187 lb 2.7 oz (84.9 kg)  04/15/12 179 lb (81.194 kg)  04/15/12 179 lb (81.194 kg)    Usual Body Weight: 180 lb  % Usual Body Weight: 97%  BMI:  Body mass index is 22.63 kg/(m^2).  Estimated Nutritional Needs: Kcal:  2000-2200 Protein: 100-110 gm Fluid: 1200 ml  Skin: Intact  Diet Order: Renal 60/70-08-23-1198 ml  EDUCATION NEEDS: -No education needs identified at this time   Intake/Output Summary (Last 24 hours) at 07/03/13 1519 Last data filed at 07/03/13 0900  Gross per 24 hour  Intake    940 ml  Output   3133 ml  Net  -2193 ml    Labs:   Recent Labs Lab 07/02/13 1220  NA 134*  K 5.1  CL 94*  CO2 28  BUN 61*  CREATININE 8.50*  CALCIUM 8.3*  GLUCOSE 93    Scheduled Meds: . amLODipine  5 mg Oral q morning - 10a  . antiseptic oral rinse  15 mL Mouth Rinse BID  . cefTRIAXone (ROCEPHIN)  IV  1 g Intravenous Q24H  . cloNIDine  0.1 mg Oral QHS  . isosorbide mononitrate  30 mg Oral BID  . lisinopril  20 mg Oral QHS  . montelukast  10 mg Oral QHS  . multivitamin  1 tablet Oral QHS  . simvastatin  10 mg Oral q1800    Continuous Infusions:   Past Medical History  Diagnosis Date  . Hypertension   . CAD (coronary artery disease)     hx MI x 4  . Hyperlipidemia   . Gout   . Arthritis   . Anxiety   . Wears hearing aid   . Neuromuscular disorder  sciatica right leg  . ESRD on hemodialysis     MWF at [facility], started HD on January 09, 2011.  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 Had bilat ureterolysis with omental wrap surgery 2003 by Dr. Patsi Sears.  . Cancer 2010    - monitoring- pt did not want tx  . Blood transfusion   . Dysrhythmia   . COPD (chronic obstructive pulmonary disease)   . Pneumonia 2009  . Anemia   . Adrenal mass     resected 2003 at time of urologic surgery for RP  fibrosis    Past Surgical History  Procedure Laterality Date  . Nose surgery      cauterized for numerous bleeds  . Kidney surgery    . Insertion of dialysis catheter    . US echocardiography    . Av fistula placement  07/11/2011    Procedure: ARTERIOVENOUS (AV) FISTULA CREATION;  Surgeon: Juleen China, MD;  Location: [facility] OR;  Service: Vascular;  Laterality:  Left;  . Coronary angioplasty with stent placement  2010  . Diagnostic laparoscopy      remove scar tissue  . Eye surgery      right cataract surgery  . Cataract extraction w/phaco  08/14/2011    Procedure: CATARACT EXTRACTION PHACO AND INTRAOCULAR LENS PLACEMENT (IOC);  Surgeon: Chalmers Guest, MD;  Location: [facility] OR;  Service: Ophthalmology;  Laterality: Left;  . Ligation of arteriovenous  fistula  07/07/2012    Procedure: LIGATION OF ARTERIOVENOUS  FISTULA;  Surgeon: Sherren Kerns, MD;  Location: [facility] OR;  Service: Vascular;  Laterality: Left;  . Insertion of dialysis catheter  07/07/2012    Procedure: INSERTION OF DIALYSIS CATHETER;  Surgeon: Sherren Kerns, MD;  Location: [facility] OR;  Service: Vascular;  Laterality: Left;  Ultrasound guided  . Av fistula placement Left 10/01/2012    Procedure: INSERTION OF ARTERIOVENOUS (AV) GORE-TEX GRAFT ARM;  Surgeon: Nada Libman, MD;  Location: [facility] OR;  Service: Vascular;  Laterality: Left;  Ultrasound guided  . Chest tube insertion  2011    Maureen Chatters, RD, LDN Pager #: [phone] After-Hours Pager #: [phone]

## 2013-07-04 LAB — CBC WITH DIFFERENTIAL/PLATELET
Basophils Absolute: 0 10*3/uL (ref 0.0–0.1)
Eosinophils Absolute: 1.1 10*3/uL — ABNORMAL HIGH (ref 0.0–0.7)
Hemoglobin: 7.4 g/dL — ABNORMAL LOW (ref 13.0–17.0)
Lymphocytes Relative: 7 % — ABNORMAL LOW (ref 12–46)
Lymphs Abs: 1.1 10*3/uL (ref 0.7–4.0)
MCH: 26 pg (ref 26.0–34.0)
Neutro Abs: 13.6 10*3/uL — ABNORMAL HIGH (ref 1.7–7.7)
Neutrophils Relative %: 79 % — ABNORMAL HIGH (ref 43–77)
Platelets: 158 10*3/uL (ref 150–400)
RBC: 2.85 MIL/uL — ABNORMAL LOW (ref 4.22–5.81)
WBC: 17.2 10*3/uL — ABNORMAL HIGH (ref 4.0–10.5)

## 2013-07-04 MED ORDER — CALCIUM ACETATE 667 MG PO CAPS: 667.0000 mg | ORAL_CAPSULE | Freq: Three times a day (TID) | ORAL | Status: AC

## 2013-07-04 MED ORDER — DARBEPOETIN ALFA-POLYSORBATE 200 MCG/0.4ML IJ SOLN: 200.0000 ug | INTRAMUSCULAR | Status: DC

## 2013-07-04 MED ORDER — SODIUM CHLORIDE 0.9 % IV SOLN: 125.0000 mg | INTRAVENOUS | Status: DC

## 2013-07-04 MED ORDER — CALCIUM ACETATE 667 MG PO CAPS
667.0000 mg | ORAL_CAPSULE | Freq: Three times a day (TID) | ORAL | Status: DC
  Filled 2013-07-04 (×3): qty 1

## 2013-07-04 MED ORDER — LEVOFLOXACIN 250 MG PO TABS: 250.0000 mg | ORAL_TABLET | Freq: Every day | ORAL | Status: AC

## 2013-07-04 MED ORDER — DOXERCALCIFEROL 4 MCG/2ML IV SOLN: 2.0000 ug | INTRAVENOUS | Status: DC

## 2013-07-04 NOTE — Consult Note (Signed)
[facility] Renal Consultation Note    Indication for Consultation:  Management of ESRD/hemodialysis; anemia, hypertension/volume and secondary hyperparathyroidism  HPI: [name] is a 79 y.o. male ESRD patient (MWF HD) with past medical history significant for hypertension, CAD/MI, COPD and frequent hemoptysis likely due to a  known lung mass presumed malignant but for which he and his family have declined work-up in the past. His hemoglobin normally runs in the mid 7's but was found to be 6.8 at the outpatient hemodialysis center. He had been experiencing weakness and hemoptysis over the past few days per staff and was advised that he could not be cleared for travel in his present state. He presented to the ED on Friday for transfusion of 2 units PRBCs with hemodialysis with the expectation that he could be discharged home after his treatment. Unfortunately, he was found to be febrile and kept for observation. At the time of this encounter, he is resting in bed and states that he is feeling a little better after his transfusion. He denies emerging complaints.  Past Medical History  Diagnosis Date  . Hypertension   . CAD (coronary artery disease)     hx MI x 4  . Hyperlipidemia   . Gout   . Arthritis   . Anxiety   . Wears hearing aid   . Neuromuscular disorder     sciatica right leg  . ESRD on hemodialysis     MWF at [facility], started HD on January 09, 2011.  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 Had bilat ureterolysis with omental wrap surgery 2003 by Dr. Patsi Sears.  . Cancer 2010    - monitoring- pt did not want tx  . Blood transfusion   . Dysrhythmia   . COPD (chronic obstructive pulmonary disease)   . Pneumonia 2009  . Anemia   . Adrenal mass     resected 2003 at time of urologic surgery for RP  fibrosis   Past Surgical History  Procedure Laterality Date  . Nose surgery      cauterized for numerous bleeds  . Kidney surgery    .  Insertion of dialysis catheter    . US echocardiography    . Av fistula placement  07/11/2011    Procedure: ARTERIOVENOUS (AV) FISTULA CREATION;  Surgeon: Juleen China, MD;  Location: [facility] OR;  Service: Vascular;  Laterality: Left;  . Coronary angioplasty with stent placement  2010  . Diagnostic laparoscopy      remove scar tissue  . Eye surgery      right cataract surgery  . Cataract extraction w/phaco  08/14/2011    Procedure: CATARACT EXTRACTION PHACO AND INTRAOCULAR LENS PLACEMENT (IOC);  Surgeon: Chalmers Guest, MD;  Location: [facility] OR;  Service: Ophthalmology;  Laterality: Left;  . Ligation of arteriovenous  fistula  07/07/2012    Procedure: LIGATION OF ARTERIOVENOUS  FISTULA;  Surgeon: Sherren Kerns, MD;  Location: [facility] OR;  Service: Vascular;  Laterality: Left;  . Insertion of dialysis catheter  07/07/2012    Procedure: INSERTION OF DIALYSIS CATHETER;  Surgeon: Sherren Kerns, MD;  Location: [facility] OR;  Service: Vascular;  Laterality: Left;  Ultrasound guided  . Av fistula placement Left 10/01/2012    Procedure: INSERTION OF ARTERIOVENOUS (AV) GORE-TEX GRAFT ARM;  Surgeon: Nada Libman, MD;  Location: [facility] OR;  Service: Vascular;  Laterality: Left;  Ultrasound guided  . Chest tube insertion  2011   Family History  Problem Relation Age  of Onset  . Heart disease Mother   . Hypertension Mother   . Heart disease Father   . COPD Father   . Hypertension Sister   . Heart disease Sister   . Anesthesia problems Neg Hx    Social History:  reports that he quit smoking about 50 years ago. His smoking use included Cigarettes. He has a 30 pack-year smoking history. He quit smokeless tobacco use about 76 years ago. He reports that he does not drink alcohol or use illicit drugs. Allergies  Allergen Reactions  . Caffeine     Seventh day advantist  . Ibuprofen Other (See Comments)    Messed up kidneys  . Pork-Derived Products     Seventh day advantist  . Shellfish Allergy     Seventh day  advantist  . Tarka [Trandolapril-Verapamil Hcl Er] Cough   Prior to Admission medications   Medication Sig Start Date End Date Taking? Authorizing Provider  albuterol (PROVENTIL) (2.5 MG/3ML) 0.083% nebulizer solution Take 2.5 mg by nebulization 2 (two) times daily as needed for wheezing.   Yes Historical Provider, MD  AMBULATORY NON FORMULARY MEDICATION Apply 1 application topically 2 (two) times daily. Medication Name: Kuumba Made Joint Support   Yes Self Requested  amLODipine (NORVASC) 10 MG tablet Take 5 mg by mouth every morning.    Yes Historical Provider, MD  cloNIDine (CATAPRES) 0.2 MG tablet Take 0.1 mg by mouth at bedtime.    Yes Historical Provider, MD  isosorbide mononitrate (IMDUR) 60 MG 24 hr tablet Take 30 mg by mouth 2 (two) times daily.    Yes Historical Provider, MD  lisinopril (PRINIVIL,ZESTRIL) 20 MG tablet Take 20 mg by mouth at bedtime.    Yes Historical Provider, MD  lovastatin (MEVACOR) 20 MG tablet Take 20 mg by mouth at bedtime.    Yes Historical Provider, MD  montelukast (SINGULAIR) 10 MG tablet Take 10 mg by mouth at bedtime.    Yes Historical Provider, MD  multivitamin (RENA-VIT) TABS tablet Take 1 tablet by mouth at bedtime.   Yes Historical Provider, MD  nitroGLYCERIN (NITROSTAT) 0.4 MG SL tablet Place 0.4 mg under the tongue every 5 (five) minutes as needed. For chest pain   Yes Historical Provider, MD  OVER THE COUNTER MEDICATION Take 1 each by mouth daily as needed (for non HD days. A milk protein product).   Yes Historical Provider, MD   Current Facility-Administered Medications  Medication Dose Route Frequency Provider Last Rate Last Dose  . acetaminophen (TYLENOL) tablet 650 mg  650 mg Oral Q6H PRN Ricki Rodriguez, MD   650 mg at 07/04/13 0302   Or  . acetaminophen (TYLENOL) suppository 650 mg  650 mg Rectal Q6H PRN Ricki Rodriguez, MD      . alum & mag hydroxide-simeth (MAALOX/MYLANTA) 200-200-20 MG/5ML suspension 30 mL  30 mL Oral Q6H PRN Ricki Rodriguez, MD       . amLODipine (NORVASC) tablet 5 mg  5 mg Oral q morning - 10a Ricki Rodriguez, MD   5 mg at 07/03/13 1016  . antiseptic oral rinse (BIOTENE) solution 15 mL  15 mL Mouth Rinse BID Ricki Rodriguez, MD   15 mL at 07/04/13 0934  . cefTRIAXone (ROCEPHIN) 1 g in dextrose 5 % 50 mL IVPB  1 g Intravenous Q24H Ricki Rodriguez, MD   1 g at 07/03/13 1307  . cloNIDine (CATAPRES) tablet 0.1 mg  0.1 mg Oral QHS Ricki Rodriguez, MD  0.1 mg at 07/03/13 2251  . feeding supplement (NEPRO CARB STEADY) liquid 237 mL  237 mL Oral BID BM Ailene Ards, RD   237 mL at 07/04/13 0934  . isosorbide mononitrate (IMDUR) 24 hr tablet 30 mg  30 mg Oral BID Ricki Rodriguez, MD   30 mg at 07/04/13 0001  . lisinopril (PRINIVIL,ZESTRIL) tablet 20 mg  20 mg Oral QHS Ricki Rodriguez, MD   20 mg at 07/02/13 2235  . montelukast (SINGULAIR) tablet 10 mg  10 mg Oral QHS Ricki Rodriguez, MD   10 mg at 07/03/13 2251  . multivitamin (RENA-VIT) tablet 1 tablet  1 tablet Oral QHS Ricki Rodriguez, MD   1 tablet at 07/03/13 2251  . ondansetron (ZOFRAN) tablet 4 mg  4 mg Oral Q6H PRN Ricki Rodriguez, MD       Or  . ondansetron (ZOFRAN) injection 4 mg  4 mg Intravenous Q6H PRN Ricki Rodriguez, MD      . simvastatin (ZOCOR) tablet 10 mg  10 mg Oral q1800 Ricki Rodriguez, MD   10 mg at 07/03/13 1931   Labs: Basic Metabolic Panel:  Recent Labs Lab 07/02/13 1220  NA 134*  K 5.1  CL 94*  CO2 28  GLUCOSE 93  BUN 61*  CREATININE 8.50*  CALCIUM 8.3*   CBC:  Recent Labs Lab 07/02/13 1220 07/03/13 1100 07/04/13 0515  WBC 12.8* 20.3* 17.2*  NEUTROABS 9.5*  --  13.6*  HGB 6.5* 8.3* 7.4*  HCT 20.0* 24.7* 22.8*  MCV 80.0 79.9 80.0  PLT 203 168 158   Cardiac Enzymes:  Recent Labs Lab 07/02/13 1220  TROPONINI <0.30   Studies/Results: No results found.  ROS: 10 pt ROS asked and answered. All systems negative except per HPI above.   Physical Exam: Filed Vitals:   07/03/13 2239 07/03/13 2357 07/04/13 0109 07/04/13 0550   BP: 114/54 113/59 112/56 114/59  Pulse: 59 61 62 64  Temp: 98.4 F (36.9 C)   97.6 F (36.4 C)  TempSrc: Oral   Axillary  Resp: 18   18  Height:      Weight:      SpO2: 100%   99%     General: Thin, pleasant, cooperative, in no acute distress. Head: Normocephalic, atraumatic, sclera non-icteric, mucus membranes are pale Neck: Supple. JVD not elevated. Lungs: Coarse breath sounds/ scattered rhonchi. + non-prod cough. Breathing is unlabored. Heart: RRR , normal heart sounds Abdomen: Soft, non-tender, non-distended with normoactive bowel sounds. No rebound/guarding. No obvious abdominal masses. M-S:  Strength and tone appear normal for age. Lower extremities:without edema or ischemic changes, no open wounds  Neuro: Alert and oriented X 3. Moves all extremities spontaneously. Psych:  Responds to questions appropriately with a normal affect. Dialysis Access: L AVG + bruit  Dialysis Orders: Center: [facility]  on MWF  EDW 79.5 HD Bath 2K/2.25Ca  Time 4:00 Heparin NONE. Access L AVF BFR 425 DFR 800    Hectorol 2 mcg IV/HD Epogen 20000  Units IV/HD  Venofer  100 mg IV x 10 doses (through 1/5 - Missed 1st dose on 12/12)    Assessment/Plan: 1. Fever/ Leukocytosis -  IV Rocephin started. BC's pending. + cough. Will order a CXR, 2. Anemia of CKD /Acute blood loss  - Recurrent hemoptysis secondary to lung mass but none reported this admit. Hgb 8.3 s/p 2 units PRBCs on Friday but dropped to 7.4 today which is close to his op baseline. Max  Aranesp. Start IV Fe load prev ordered op. Recheck CBC, transfuse for Hgb < 7 3. ESRD -  MWF, Next HD tomorrow. No heparin. 4. Hypertension/volume  - SBPs 110s on home meds. No overt excess. UF 1-2L tomorrow. CXR pending. 5. Metabolic bone disease -  Ca and Phos controlled op. Last PTH 324. Continue Phoslo 1 ac and Vit D 6. Nutrition - Renal diet/ multivitamin/ ONS 7. COPD - Home meds 8. Hx CAD/MI  - Home meds 9. DNR  Claud Kelp, PA-C [facility] Pager [phone] 07/04/2013, 9:50 AM   I have seen and examined patient, discussed with PA and agree with assessment and plan as outlined above. Vinson Moselle MD pager [phone]    cell [phone] 07/04/2013, 2:00 PM

## 2013-07-04 NOTE — Discharge Summary (Signed)
Physician Discharge Summary  Patient ID: [name] MRN: [id number] DOB/AGE: 1925/10/14 79 y.o.  Admit date: 07/02/2013 Discharge date: 07/04/2013  Admission Diagnoses: Weakness  Anemia of chronic renal disease and blood loss  Hypertension  CAD  Arthritis  End stage renal disease with hemodialysis.  Lung mass with pleural effusion and hemoptysis-Chronic  Discharge Diagnoses:  Principle Problem: * Anemia due to blood loss, chronic and renal disease * Weakness  Hypertension  CAD  Arthritis  End stage renal disease with hemodialysis.  Lung mass with pleural effusion and hemoptysis-Chronic  Discharged Condition: fair  Hospital Course: 79 years old male with End stage renal disease, CAD, hypertension and lung cancer had low hgb of 6.5 with weakness but no chest pain or shortness of breath. He received Packed RBC and his Hgb on discahrge day improved to 7.4 g/dL. He was made DNR and patient , wife and family were in agreement not to pursue heroic treatment and blood transfusions for lung cancer patient with advanced age, multiple medical conditions and ESRD. He was given IV rocephin for fever post blood transfusion. He was discharged home with follow up in 3 days. Wife wants to take him to [place] (as they have done it for many years). His prognosis remains poor.  Consults: Nephrology  Significant Diagnostic Studies: labs: Hgb-6.5, WBC-12K to 20 K. Platelets count normal. Elevated BUN/Cr of 61/8.5  Treatments: dialysis: Hemodialysis and Blood transfusion   Discharge Exam: Blood pressure 130/62, pulse 64, temperature 97.8 F (36.6 C), temperature source Oral, resp. rate 20, height 6\' 2"  (1.88 m), weight 80 kg (176 lb 5.9 oz), SpO2 100.00%. Physical exam: HEENT: [place]/AT, Eyes-Brown, PERL, EOMI, Conjunctiva-Pale, Sclera-Non-icteric  Neck: No JVD, No bruit, Trachea midline.  Lungs: Clear, Bilateral.  Cardiac: Regular rhythm, normal S1 and S2, no S3.  Abdomen: Soft, non-tender.   Extremities: Trace feet edema present. No cyanosis. + clubbing.  CNS: AxOx3, Cranial nerves grossly intact, moves all 4 extremities. Right handed.  Skin: Warm and dry.  Disposition: 01-Home or Self Care     Medication List         albuterol (2.5 MG/3ML) 0.083% nebulizer solution  Commonly known as:  PROVENTIL  Take 2.5 mg by nebulization 2 (two) times daily as needed for wheezing.     AMBULATORY NON FORMULARY MEDICATION  Apply 1 application topically 2 (two) times daily. Medication Name: Kuumba Made Joint Support     amLODipine 10 MG tablet  Commonly known as:  NORVASC  Take 5 mg by mouth every morning.     calcium acetate 667 MG capsule  Commonly known as:  PHOSLO  Take 1 capsule (667 mg total) by mouth 3 (three) times daily with meals.     cloNIDine 0.2 MG tablet  Commonly known as:  CATAPRES  Take 0.1 mg by mouth at bedtime.     isosorbide mononitrate 60 MG 24 hr tablet  Commonly known as:  IMDUR  Take 30 mg by mouth 2 (two) times daily.     levofloxacin 250 MG tablet  Commonly known as:  LEVAQUIN  Take 1 tablet (250 mg total) by mouth daily.     lisinopril 20 MG tablet  Commonly known as:  PRINIVIL,ZESTRIL  Take 20 mg by mouth at bedtime.     lovastatin 20 MG tablet  Commonly known as:  MEVACOR  Take 20 mg by mouth at bedtime.     montelukast 10 MG tablet  Commonly known as:  SINGULAIR  Take 10 mg by mouth  at bedtime.     multivitamin Tabs tablet  Take 1 tablet by mouth at bedtime.     nitroGLYCERIN 0.4 MG SL tablet  Commonly known as:  NITROSTAT  Place 0.4 mg under the tongue every 5 (five) minutes as needed. For chest pain     OVER THE COUNTER MEDICATION  Take 1 each by mouth daily as needed (for non HD days. A milk protein product).           Follow-up Information   Follow up with [facility] S, MD. Schedule an appointment as soon as possible for a visit in 3 days.   Specialty:  Cardiology   Contact information:   [address] [place] [address] [phone]       Signed: Ricki Rodriguez 07/04/2013, 11:56 AM

## 2013-07-09 LAB — CULTURE, BLOOD (ROUTINE X 2)

## 2013-09-19 DEATH — deceased

## 2013-10-18 ENCOUNTER — Telehealth: Payer: Self-pay

## 2013-10-18 NOTE — Telephone Encounter (Signed)
Patient past away in [place], [place] per Iver Nestle in [place]

## 2014-06-30 ENCOUNTER — Encounter (HOSPITAL_COMMUNITY): Payer: Self-pay | Admitting: Surgery
